# Patient Record
Sex: Female | Born: 1945
Health system: Southern US, Community
[De-identification: ages and names within clinical notes are randomized; demographics above are authoritative.]

## PROBLEM LIST (undated history)

## (undated) DIAGNOSIS — D131 Benign neoplasm of stomach: Secondary | ICD-10-CM

## (undated) DIAGNOSIS — Q402 Other specified congenital malformations of stomach: Secondary | ICD-10-CM

## (undated) DIAGNOSIS — C801 Malignant (primary) neoplasm, unspecified: Secondary | ICD-10-CM

## (undated) DIAGNOSIS — K635 Polyp of colon: Secondary | ICD-10-CM

## (undated) DIAGNOSIS — J309 Allergic rhinitis, unspecified: Secondary | ICD-10-CM

## (undated) DIAGNOSIS — M1990 Unspecified osteoarthritis, unspecified site: Secondary | ICD-10-CM

## (undated) DIAGNOSIS — F419 Anxiety disorder, unspecified: Secondary | ICD-10-CM

## (undated) DIAGNOSIS — E669 Obesity, unspecified: Secondary | ICD-10-CM

## (undated) DIAGNOSIS — K529 Noninfective gastroenteritis and colitis, unspecified: Secondary | ICD-10-CM

## (undated) DIAGNOSIS — G4730 Sleep apnea, unspecified: Secondary | ICD-10-CM

## (undated) DIAGNOSIS — K219 Gastro-esophageal reflux disease without esophagitis: Secondary | ICD-10-CM

## (undated) DIAGNOSIS — T7840XA Allergy, unspecified, initial encounter: Secondary | ICD-10-CM

## (undated) DIAGNOSIS — K5790 Diverticulosis of intestine, part unspecified, without perforation or abscess without bleeding: Secondary | ICD-10-CM

## (undated) DIAGNOSIS — H269 Unspecified cataract: Secondary | ICD-10-CM

## (undated) DIAGNOSIS — D126 Benign neoplasm of colon, unspecified: Secondary | ICD-10-CM

## (undated) DIAGNOSIS — K5010 Crohn's disease of large intestine without complications: Secondary | ICD-10-CM

## (undated) DIAGNOSIS — K824 Cholesterolosis of gallbladder: Secondary | ICD-10-CM

## (undated) DIAGNOSIS — I10 Essential (primary) hypertension: Secondary | ICD-10-CM

## (undated) DIAGNOSIS — K648 Other hemorrhoids: Secondary | ICD-10-CM

## (undated) DIAGNOSIS — E785 Hyperlipidemia, unspecified: Secondary | ICD-10-CM

## (undated) HISTORY — DX: Anxiety disorder, unspecified: F41.9

## (undated) HISTORY — DX: Malignant (primary) neoplasm, unspecified: C80.1

## (undated) HISTORY — DX: Crohn's disease of large intestine without complications: K50.10

## (undated) HISTORY — PX: POLYPECTOMY: SHX149

## (undated) HISTORY — DX: Unspecified cataract: H26.9

## (undated) HISTORY — DX: Essential (primary) hypertension: I10

## (undated) HISTORY — DX: Diverticulosis of intestine, part unspecified, without perforation or abscess without bleeding: K57.90

## (undated) HISTORY — DX: Unspecified osteoarthritis, unspecified site: M19.90

## (undated) HISTORY — DX: Other specified congenital malformations of stomach: Q40.2

## (undated) HISTORY — DX: Benign neoplasm of colon, unspecified: D12.6

## (undated) HISTORY — DX: Polyp of colon: K63.5

## (undated) HISTORY — PX: COLONOSCOPY: SHX174

## (undated) HISTORY — DX: Other hemorrhoids: K64.8

## (undated) HISTORY — DX: Sleep apnea, unspecified: G47.30

## (undated) HISTORY — DX: Allergic rhinitis, unspecified: J30.9

## (undated) HISTORY — DX: Obesity, unspecified: E66.9

## (undated) HISTORY — DX: Gastro-esophageal reflux disease without esophagitis: K21.9

## (undated) HISTORY — DX: Allergy, unspecified, initial encounter: T78.40XA

## (undated) HISTORY — DX: Cholesterolosis of gallbladder: K82.4

## (undated) HISTORY — DX: Benign neoplasm of stomach: D13.1

## (undated) HISTORY — DX: Hyperlipidemia, unspecified: E78.5

## (undated) HISTORY — DX: Noninfective gastroenteritis and colitis, unspecified: K52.9

## (undated) HISTORY — PX: UPPER GASTROINTESTINAL ENDOSCOPY: SHX188

---

## 1992-03-13 HISTORY — PX: SPINE SURGERY: SHX786

## 1997-10-15 ENCOUNTER — Other Ambulatory Visit: Admission: RE | Admit: 1997-10-15 | Discharge: 1997-10-15 | Payer: Self-pay | Admitting: Obstetrics & Gynecology

## 1997-10-16 ENCOUNTER — Other Ambulatory Visit: Admission: RE | Admit: 1997-10-16 | Discharge: 1997-10-16 | Payer: Self-pay | Admitting: Obstetrics & Gynecology

## 1998-12-14 ENCOUNTER — Encounter (INDEPENDENT_AMBULATORY_CARE_PROVIDER_SITE_OTHER): Payer: Self-pay | Admitting: Specialist

## 1998-12-14 ENCOUNTER — Other Ambulatory Visit: Admission: RE | Admit: 1998-12-14 | Discharge: 1998-12-14 | Payer: Self-pay | Admitting: Obstetrics & Gynecology

## 2000-01-05 ENCOUNTER — Other Ambulatory Visit: Admission: RE | Admit: 2000-01-05 | Discharge: 2000-01-05 | Payer: Self-pay | Admitting: Obstetrics & Gynecology

## 2000-02-01 ENCOUNTER — Encounter (INDEPENDENT_AMBULATORY_CARE_PROVIDER_SITE_OTHER): Payer: Self-pay

## 2000-02-01 ENCOUNTER — Other Ambulatory Visit: Admission: RE | Admit: 2000-02-01 | Discharge: 2000-02-01 | Payer: Self-pay | Admitting: Obstetrics & Gynecology

## 2001-05-16 ENCOUNTER — Other Ambulatory Visit: Admission: RE | Admit: 2001-05-16 | Discharge: 2001-05-16 | Payer: Self-pay | Admitting: Obstetrics & Gynecology

## 2003-03-14 HISTORY — PX: FOOT SURGERY: SHX648

## 2004-09-16 ENCOUNTER — Ambulatory Visit: Payer: Self-pay | Admitting: Podiatry

## 2009-03-13 HISTORY — PX: BLADDER SUSPENSION: SHX72

## 2009-05-04 ENCOUNTER — Encounter: Payer: Self-pay | Admitting: Pulmonary Disease

## 2010-02-09 DIAGNOSIS — R05 Cough: Secondary | ICD-10-CM

## 2010-02-10 ENCOUNTER — Ambulatory Visit: Payer: Self-pay | Admitting: Pulmonary Disease

## 2010-02-10 DIAGNOSIS — E785 Hyperlipidemia, unspecified: Secondary | ICD-10-CM

## 2010-02-10 DIAGNOSIS — E1169 Type 2 diabetes mellitus with other specified complication: Secondary | ICD-10-CM | POA: Insufficient documentation

## 2010-02-10 DIAGNOSIS — J449 Chronic obstructive pulmonary disease, unspecified: Secondary | ICD-10-CM | POA: Insufficient documentation

## 2010-02-10 DIAGNOSIS — R0602 Shortness of breath: Secondary | ICD-10-CM

## 2010-02-10 DIAGNOSIS — G4733 Obstructive sleep apnea (adult) (pediatric): Secondary | ICD-10-CM

## 2010-02-10 DIAGNOSIS — E1159 Type 2 diabetes mellitus with other circulatory complications: Secondary | ICD-10-CM | POA: Insufficient documentation

## 2010-02-10 DIAGNOSIS — R0609 Other forms of dyspnea: Secondary | ICD-10-CM | POA: Insufficient documentation

## 2010-02-10 DIAGNOSIS — J45909 Unspecified asthma, uncomplicated: Secondary | ICD-10-CM

## 2010-02-10 DIAGNOSIS — I10 Essential (primary) hypertension: Secondary | ICD-10-CM | POA: Insufficient documentation

## 2010-03-13 HISTORY — PX: NASAL SINUS SURGERY: SHX719

## 2010-04-12 NOTE — Assessment & Plan Note (Signed)
Summary: ASTHMA/ SOB X 1 YR- SELF REFERRAL//KP   Visit Type:  Initial Consult Copy to:  self Primary Khing Belcher/Referring Syna Gad:  Nancy Fetter, NP  CC:  cough.  History of Present Illness: 64/F, ex smoker for evaluation of dyspnea c/o DOE x 1 yr, worse x 4 m cough x 2 yrs,paroxysms, seen by dr Redmond Baseman - 'swollen larynx' - nexium two times a day helped but has not stopped PSG  feb '11 >>severe OSA with AHI 36/h , on CPAP x april  '11 O2 added to CPAP based on noct oximetry ASthma diagnosed 1 yr ago, no specific triggers, no nocturnal symptoms.Advair helps with breathing, Has tried singualir, qvar in the past. Spiroemtry s/o restriction -per report, CXR '06 nml, aug'11 pulm vascular congestion, no infiltrates Cardiolite stress test was nml per report No drop in satn on ambulation, PFTs - no airway obstruction, no restriction, nml DLCO CXR - mild cardiomegaly, bibasal atx  Preventive Screening-Counseling & Management  Alcohol-Tobacco     Alcohol drinks/day: 0     Smoking Status: quit     Packs/Day: 2.0     Year Started: 1965     Year Quit: 1985  Current Medications (verified): 1)  Nexium 40 Mg Cpdr (Esomeprazole Magnesium) .... Take 1 Capsule By Mouth Two Times A Day 2)  Xanax 0.25 Mg Tabs (Alprazolam) .... As Needed 3)  Advair Hfa 115-21 Mcg/act Aero (Fluticasone-Salmeterol) .... Inhale 2 Puffs Two Times A Day 4)  Fluticasone Propionate 50 Mcg/act Susp (Fluticasone Propionate) .... Daily 5)  Simvastatin 20 Mg Tabs (Simvastatin) .... Take 1 Tab By Mouth At Bedtime 6)  Hydrochlorothiazide 25 Mg Tabs (Hydrochlorothiazide) .... Take 1 Tablet By Mouth Once A Day 7)  Lovaza 1 Gm Caps (Omega-3-Acid Ethyl Esters) .... Take 2 Tablet By Mouth Two Times A Day 8)  Vitamin D (Ergocalciferol) 50000 Unit Caps (Ergocalciferol) .... Once Monthly 9)  Delsym Night Time Cough/cold 15-6.25-500 Mg/57m Liqd (Dm-Doxylamine-Acetaminophen) .... Daily 10)  Cpap 11)  Oxygen .... 2 Liters At  Bedtime  Allergies (verified): 1)  ! Sulfa 2)  ! Amoxicillin 3)  ! 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  The patient denies non-productive cough, coughing up blood, chest pain, irregular heartbeats, acid heartburn, indigestion, loss of appetite, weight change, abdominal pain, difficulty swallowing, sore throat, tooth/dental problems, headaches, nasal congestion/difficulty breathing through nose, sneezing, itching, ear ache, anxiety, depression, hand/feet swelling, joint stiffness or pain, rash, change in color of mucus, and fever.    Vital Signs:  Patient profile:   65year old female Height:      65 inches Weight:      237 pounds BMI:     39.58 O2 Sat:      94 % on Room air Temp:     98.4 degrees F oral Pulse rate:   94 / minute BP sitting:   120 / 78  (left arm) Cuff size:   large  Vitals Entered By: JIran PlanasCMA (February 10, 2010 2:28 PM)  O2 Flow:  Room air  Serial Vital Signs/Assessments:  Comments: Ambulatory Pulse Oximetry  Resting; HR__88___    02 Sat__95%RA___  Lap1 (185 feet)   HR__135___   02 Sat__92%RA___ Lap2 (185 feet)   HR__118___   02 Sat__92%RA___    Lap3 (185 feet)   HR__118___   02 Sat__92%RA___  _x__Test Completed without Difficulty Iran Planas CMA  February 10, 2010 3:30 PM  ___Test Stopped due to:   By: Iran Planas CMA   CC: cough Comments Medications reviewed with patient Verified contact number and pharmacy with patient Iran Planas CMA  February 10, 2010  2:28 PM    Physical Exam  Additional Exam:  Gen. Pleasant, well-nourished, in no distress, normal affect ENT - no lesions, no post nasal drip, class 3 airway Neck: No JVD, no thyromegaly, no carotid bruits Lungs: no use of accessory muscles, no dullness to percussion, clear without rales or rhonchi  Cardiovascular: Rhythm regular, heart sounds  normal, no murmurs or gallops, no peripheral edema Abdomen: soft and non-tender, no hepatosplenomegaly, BS normal. Musculoskeletal: No deformities, no cyanosis or clubbing Neuro:  alert, non focal     CXR  Procedure date:  02/10/2010  Findings:      IMPRESSION: Cardiomegaly without edema.  Bibasilar atelectasis.  Impression & Recommendations:  Problem # 1:  DYSPNEA (ICD-786.05) Unclear cause - PFTs nml (took advair this am) CXR nml except for mild cardiomegaly Does not desaturate on exertion, doubt she has occult pulmonary fibrosis No risk factors for thromboembolic disease Cardiac evaluation defered to PMD - she had cardiolite stress test nml by report. Exercise program for deconditioning Orders: Pulmonary Referral (Pulmonary) T-2 View CXR (71020TC) New Patient Level III (91638)  Problem # 2:  ASTHMA (ICD-493.90) stay on advair for now  Problem # 3:  SLEEP APNEA (ICD-780.57)  Compliance encouraged, wt loss emphasized, asked to avoid meds with sedative side effects, cautioned against driving when sleepy.   Orders: New Patient Level III (46659)  Medications Added to Medication List This Visit: 1)  Advair Hfa 115-21 Mcg/act Aero (Fluticasone-salmeterol) .... Inhale 2 puffs two times a day 2)  Fluticasone Propionate 50 Mcg/act Susp (Fluticasone propionate) .... Daily 3)  Simvastatin 20 Mg Tabs (Simvastatin) .... Take 1 tab by mouth at bedtime 4)  Hydrochlorothiazide 25 Mg Tabs (Hydrochlorothiazide) .... Take 1 tablet by mouth once a day 5)  Lovaza 1 Gm Caps (Omega-3-acid ethyl esters) .... Take 2 tablet by mouth two times a  day 6)  Vitamin D (ergocalciferol) 50000 Unit Caps (Ergocalciferol) .... Once monthly 7)  Delsym Night Time Cough/cold 15-6.25-500 Mg/36m Liqd (Dm-doxylamine-acetaminophen) .... Daily 8)  Cpap  9)  Oxygen  .... 2 liters at bedtime  Patient Instructions: 1)  Copy sent tDJ:TTSVXBDoug SouFNP, [place] 2)  Please schedule a follow-up appointment in 2-3 months. 3)  Breathing test was normal 4)  Oxygen level did not drop when walking 5)  chest xray was normal 6)  Stay on advair  7)  A 2D Echocardiogram has been recommended.  Your imaging study may require preauthorization. 8)  Start on an exercise program    Immunization History:  Influenza Immunization History:    Influenza:  historical (01/17/2010)

## 2010-04-12 NOTE — Miscellaneous (Signed)
Summary: Orders Update pft charges  Clinical Lists Changes  Orders: Added new Service order of Carbon Monoxide diffusing w/capacity (94720) - Signed Added new Service order of Lung Volumes (94240) - Signed Added new Service order of Spirometry (Pre & Post) (94060) - Signed 

## 2010-10-29 LAB — HM DEXA SCAN: HM Dexa Scan: NORMAL

## 2010-12-17 ENCOUNTER — Emergency Department (HOSPITAL_COMMUNITY)
Admission: EM | Admit: 2010-12-17 | Discharge: 2010-12-17 | Disposition: A | Discharge status: Home / Self Care | Payer: BC Managed Care – PPO | Attending: Emergency Medicine | Admitting: Emergency Medicine

## 2010-12-17 DIAGNOSIS — I10 Essential (primary) hypertension: Secondary | ICD-10-CM | POA: Insufficient documentation

## 2010-12-17 DIAGNOSIS — M542 Cervicalgia: Secondary | ICD-10-CM | POA: Insufficient documentation

## 2010-12-17 DIAGNOSIS — E7800 Pure hypercholesterolemia, unspecified: Secondary | ICD-10-CM | POA: Insufficient documentation

## 2010-12-17 DIAGNOSIS — Z9889 Other specified postprocedural states: Secondary | ICD-10-CM | POA: Insufficient documentation

## 2012-03-13 HISTORY — PX: UPPER GASTROINTESTINAL ENDOSCOPY: SHX188

## 2012-10-28 LAB — HM MAMMOGRAPHY

## 2012-11-29 ENCOUNTER — Encounter: Payer: Self-pay | Admitting: Internal Medicine

## 2012-12-30 ENCOUNTER — Encounter: Payer: Self-pay | Admitting: Internal Medicine

## 2012-12-31 ENCOUNTER — Encounter: Payer: Self-pay | Admitting: Internal Medicine

## 2012-12-31 ENCOUNTER — Ambulatory Visit (INDEPENDENT_AMBULATORY_CARE_PROVIDER_SITE_OTHER): Payer: BC Managed Care – PPO | Admitting: Internal Medicine

## 2012-12-31 VITALS — BP 138/76 | HR 86 | Ht 65.0 in | Wt 234.0 lb

## 2012-12-31 DIAGNOSIS — R05 Cough: Secondary | ICD-10-CM

## 2012-12-31 DIAGNOSIS — R140 Abdominal distension (gaseous): Secondary | ICD-10-CM

## 2012-12-31 DIAGNOSIS — R141 Gas pain: Secondary | ICD-10-CM

## 2012-12-31 DIAGNOSIS — K219 Gastro-esophageal reflux disease without esophagitis: Secondary | ICD-10-CM

## 2012-12-31 DIAGNOSIS — Z1211 Encounter for screening for malignant neoplasm of colon: Secondary | ICD-10-CM

## 2012-12-31 MED ORDER — RESTORA PO CAPS: 1.0000 | ORAL_CAPSULE | Freq: Every day | ORAL | Status: DC

## 2012-12-31 MED ORDER — SIMETHICONE 125 MG PO CHEW: 125.0000 mg | CHEWABLE_TABLET | Freq: Four times a day (QID) | ORAL | Status: DC | PRN

## 2012-12-31 NOTE — Patient Instructions (Signed)
You have been scheduled for an endoscopy with propofol. Please follow written instructions given to you at your visit today. If you use inhalers (even only as needed), please bring them with you on the day of your procedure. Your physician has requested that you go to www.[email or web address] and enter the access code given to you at your visit today. This web site gives a general overview about your procedure. However, you should still follow specific instructions given to you by our office regarding your preparation for the procedure.  We have sent the following medications to your pharmacy for you to pick up at your convenience: Restora, Phazym   You have been given information in a anti-gas diet                                               We are excited to introduce MyChart, a new best-in-class service that provides you online access to important information in your electronic medical record. We want to make it easier for you to view your health information - all in one secure location - when and where you need it. We expect MyChart will enhance the quality of care and service we provide.  When you register for MyChart, you can:    View your test results.    Request appointments and receive appointment reminders via email.    Request medication renewals.    View your medical history, allergies, medications and immunizations.    Communicate with your physician's office through a password-protected site.    Conveniently print information such as your medication lists.  To find out if MyChart is right for you, please talk to a member of our clinical staff today. We will gladly answer your questions about this free health and wellness tool.  If you are age 63 or older and want a member of your family to have access to your record, you must provide written consent by completing a proxy form available at our office. Please speak to our clinical staff about guidelines regarding accounts for  patients younger than age 3.  As you activate your MyChart account and need any technical assistance, please call the MyChart technical support line at [phone]) or email your question to [email or web address]. If you email your question(s), please include your name, a return phone number and the best time to reach you.  If you have non-urgent health-related questions, you can send a message to our office through [place] at [email or web address]. If you have a medical emergency, call 911.  Thank you for using MyChart as your new health and wellness resource!   MyChart licensed from [employer],  1999-2010. Patents Pending.

## 2012-12-31 NOTE — Progress Notes (Addendum)
Patient ID: Kayla Maddox, female   DOB: 24-Aug-1945, 67 y.o.   MRN: [id number] HPI: Mrs. Kayla Maddox is a 67 yo female with PMH of hypertension, hyperlipidemia, sleep apnea, allergic rhinitis who seen in consultation at the request of Dr. Harold Hedge to evaluate for possible reflux disease and chronic cough. The patient reports at least 3 years of chronic episodic coughing. She has been evaluated previously by ENT, allergy and asthma, and pulmonary. She is currently following with Dr. Harold Hedge and has been started on Qvar and nasal fluticasone. She reports significant improvement in her cough with the initiation of this therapy. She has not having to use albuterol rescue inhaler. She also reports improvement in her dyspnea and denies dyspnea altogether today. She reports her cough is probably 50% better. She still has episodes of coughing throughout the day at times this can be productive of yellowish phlegm. She does feel it is slightly worse just as she is lying down, but is not having coughing episodes throughout the night. It is not interfering with sleep. She denies abdominal pain. No nausea or vomiting. She has been started on omeprazole 20 mg twice daily. Initially she was having difficulty remembering the second dose, but over the past month she has been using it twice daily before breakfast and dinner. She reports she's never really had heartburn. She does have a history of globus sensation but denies dysphagia or odynophagia. She denies waterbrash. No regurgitation of food or fluid. She does report abdominal bloating and gas along with belching after her evening meal. This does not seem to happen in the morning or at lunch. She reports normal bowel habits without blood in her stool or melena. Occasionally she'll have an urgent stool after eating a greasy meal. She reports a history of colonoscopy 9 years ago and was told to have it repeated in 10 years. She did call her GI practice was performed her first  colonoscopy recently and she is sure that the test was 9 years ago.  Past Medical History  Diagnosis Date  . Anxiety   . Allergic rhinitis   . Hypertension   . Hyperlipidemia   . Obesity   . Sleep apnea     Past Surgical History  Procedure Laterality Date  . Nasal sinus surgery    . Thoracotomy    . Spine surgery    . Bladder suspension    . Foot surgery      Current Outpatient Prescriptions  Medication Sig Dispense Refill  . albuterol (PROVENTIL HFA;VENTOLIN HFA) 108 (90 BASE) MCG/ACT inhaler Inhale 2 puffs into the lungs every 6 (six) hours as needed for wheezing.      Marland Kitchen ALPRAZolam (XANAX) 0.25 MG tablet Take 0.25 mg by mouth at bedtime as needed for sleep.      . beclomethasone (QVAR) 80 MCG/ACT inhaler Inhale 1 puff into the lungs as needed.      . cetirizine (ZYRTEC) 10 MG chewable tablet Chew 10 mg by mouth daily.      . Cholecalciferol (VITAMIN D-3) 1000 UNITS CAPS Take 1 capsule by mouth daily.      Marland Kitchen co-enzyme Q-10 30 MG capsule Take 30 mg by mouth 3 (three) times daily.      . fluticasone (FLONASE) 50 MCG/ACT nasal spray Place 2 sprays into the nose daily.      . hydrochlorothiazide (HYDRODIURIL) 12.5 MG tablet Take 25 mg by mouth daily.       Marland Kitchen omeprazole (PRILOSEC) 20 MG capsule Take  20 mg by mouth 2 (two) times daily.       . simvastatin (ZOCOR) 20 MG tablet Take 20 mg by mouth every evening.       No current facility-administered medications for this visit.    Allergies  Allergen Reactions  . Amoxicillin   . Clarithromycin   . Doxycycline   . Prednisone   . Sulfonamide Derivatives     Family History  Problem Relation Age of Onset  . Diabetes Father   . Hypertension Father   . Hypertension Mother   . Heart disease Mother   . Heart disease Father     History  Substance Use Topics  . Smoking status: Former Research scientist (life sciences)  . Smokeless tobacco: Never Used  . Alcohol Use: No    ROS: As per history of present illness, otherwise negative  BP 138/76   Pulse 86  Ht 5' 5"  (1.651 m)  Wt 234 lb (106.142 kg)  BMI 38.94 kg/m2 Constitutional: Well-developed and well-nourished. No distress. HEENT: Normocephalic and atraumatic. Oropharynx is clear and moist. No oropharyngeal exudate. Conjunctivae are normal.  No scleral icterus. Neck: Neck supple. Trachea midline. Cardiovascular: Normal rate, regular rhythm and intact distal pulses. No M/R/G Pulmonary/chest: Effort normal and breath sounds normal. No wheezing, rales or rhonchi. Abdominal: Soft, nontender, nondistended. Bowel sounds active throughout.  Extremities: no clubbing, cyanosis, or edema Lymphadenopathy: No cervical adenopathy noted. Neurological: Alert and oriented to person place and time. Skin: Skin is warm and dry. No rashes noted. Psychiatric: Normal mood and affect. Behavior is normal.  ASSESSMENT/PLAN:  67 yo female with PMH of hypertension, hyperlipidemia, sleep apnea, allergic rhinitis who seen in consultation at the request of Dr. Harold Hedge to evaluate for possible reflux disease and chronic cough.   1.  Chronic cough/?GERD -- chronic cough has improved with nasal and inhaled steroid. Her cough may in fact be multifactorial, and it is difficult to know whether or not she truly has GERD.  She is on twice a day PPI which we have discussed should give her near maximal acid suppression. We discussed how the workup includes upper endoscopy to rule out changes of reflux such as Barrett's esophagus erosive esophagitis. It will also help exclude a large hiatal hernia which could contribute to reflux. After discussion the risks and benefits of this test, she is agreeable to proceed. For now she will continue omeprazole 20 mg twice daily. We have also discussed 24-hour pH testing should her upper endoscopy be unremarkable. If her 24-hour pH testing revealed correlation between reflux and coughing, options would be change in acid suppressing medication or possibly an antireflux surgery. GERD  diet recommended.  2.  Gas/bloating -- I have given her an anti-bloating diet and recommended a daily probiotic. She was given samples of Restora, once daily.  She can also use Phazyme after her evening meal as needed. We will reassess her bloating after these measures.  3.  CRC screening -- she would like to have her subsequent screening colonoscopies here. We discussed possibly proceeding at the time of her upper endoscopy, but she would prefer to wait the full 10 years. We have requested her previous colonoscopy records from her exam 9 years ago.   Addendum, colonoscopy records received from [facility] in [place], [place] Colonoscopy was performed on 05/21/2003 -- exam to the terminal ileum- findings normal rectal exam, in the sigmoid exudative lesions with apthous-like ulceration, again exudative and ulcerative in the cecum with occasional ulcers throughout the colon. 6  mm descending polyp and a 4 mm sigmoid polyp. Pathology hyperplastic polyp, random biopsies chronic inflammation with focal active cryptitis --Notes indicate patient was treated with antibiotics  Based on the above finding, she would be due for screening colonoscopy in March of 2015. Recall will be placed

## 2013-01-01 ENCOUNTER — Encounter: Payer: Self-pay | Admitting: Internal Medicine

## 2013-01-01 ENCOUNTER — Ambulatory Visit (AMBULATORY_SURGERY_CENTER): Payer: BC Managed Care – PPO | Admitting: Internal Medicine

## 2013-01-01 VITALS — BP 135/92 | HR 72 | Temp 96.8°F | Resp 33 | Ht 65.0 in | Wt 234.0 lb

## 2013-01-01 DIAGNOSIS — R05 Cough: Secondary | ICD-10-CM

## 2013-01-01 DIAGNOSIS — K2990 Gastroduodenitis, unspecified, without bleeding: Secondary | ICD-10-CM

## 2013-01-01 DIAGNOSIS — K2970 Gastritis, unspecified, without bleeding: Secondary | ICD-10-CM

## 2013-01-01 DIAGNOSIS — H5040 Unspecified heterotropia: Secondary | ICD-10-CM

## 2013-01-01 MED ORDER — SODIUM CHLORIDE 0.9 % IV SOLN: 500.0000 mL | INTRAVENOUS | Status: DC

## 2013-01-01 NOTE — Progress Notes (Signed)
Called to room to assist during endoscopic procedure.  Patient ID and intended procedure confirmed with present staff. Received instructions for my participation in the procedure from the performing physician.  

## 2013-01-01 NOTE — Op Note (Signed)
[place]  [address] [place], [address]   ENDOSCOPY PROCEDURE REPORT  PATIENT: [name], [name]  MR#: [id number] BIRTHDATE: 1945-06-22 , 57  yrs. old GENDER: Female ENDOSCOPIST: Jerene Bears, MD REFERRED BY:  Melvern Sample, MD PROCEDURE DATE:  01/01/2013 PROCEDURE:  EGD w/ biopsy ASA CLASS:     Class III INDICATIONS:  Chronic cough MEDICATIONS: MAC sedation, administered by CRNA and propofol (Diprivan) 257m IV TOPICAL ANESTHETIC: Cetacaine Spray  DESCRIPTION OF PROCEDURE: After the risks benefits and alternatives of the procedure were thoroughly explained, informed consent was obtained.  The LB [id number] was introduced through the mouth and advanced to the second portion of the duodenum. Without limitations.  The instrument was slowly withdrawn as the mucosa was fully examined.     ESOPHAGUS: A variable Z-line was observed 40 cm from the incisors. The mucosa of the esophagus appeared normal.   No evidence of esophagitis or Barrett's esophagus.  STOMACH: Multiple sessile polyps ranging between 3-593min size were found in the gastric fundus and gastric body.  Multiple biopsies was performed using cold forceps.   Mild nodular gastritis (inflammation) was found in the gastric antrum.  Multiple biopsies were performed using cold forceps.  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  Multiple biopsies from duodenal bulb.   The duodenal mucosa showed no abnormalities in the 2nd part of the duodenum.  Retroflexed views revealed no abnormalities.     The scope was then withdrawn from the patient and the procedure completed.  COMPLICATIONS: There were no complications.  ENDOSCOPIC IMPRESSION: 1.   Variable Z-line was observed 40 cm from the incisors 2.   The mucosa of the esophagus appeared normal 3.   Multiple sessile polyps ranging between 3-60m47mn size  were found in the gastric fundus and gastric body 4.   Nodular gastritis (inflammation) was found in the gastric antrum; multiple biopsies 5.   Duodenal inflammation was found in the duodenal bulb 6.   The duodenal mucosa showed no abnormalities in the 2nd part of the duodenum  RECOMMENDATIONS: 1.  Await pathology results 2.  Continue current medications  eSigned:  JayJerene BearsD 01/01/2013 9:44 AM   CC: The patient, LynKennith MaesD, ChrMelvern SampleD  PATIENT NAME:  Kayla, Maddox#: [id number]

## 2013-01-01 NOTE — Progress Notes (Signed)
Patient did not experience any of the following events: a burn prior to discharge; a fall within the facility; wrong site/side/patient/procedure/implant event; or a hospital transfer or hospital admission upon discharge from the facility. (G8907)Patient did not have preoperative order for IV antibiotic SSI prophylaxis. (G8918) ewm 

## 2013-01-01 NOTE — Progress Notes (Signed)
Lidocaine-40mg IV prior to Propofol InductionPropofol given over incremental dosages 

## 2013-01-01 NOTE — Patient Instructions (Signed)
YOU HAD AN ENDOSCOPIC PROCEDURE TODAY AT [facility]: Refer to the procedure report that was given to you for any specific questions about what was found during the examination.  If the procedure report does not answer your questions, please call your gastroenterologist to clarify.  If you requested that your care partner not be given the details of your procedure findings, then the procedure report has been included in a sealed envelope for you to review at your convenience later.  YOU SHOULD EXPECT: Some feelings of bloating in the abdomen. Passage of more gas than usual.  Walking can help get rid of the air that was put into your GI tract during the procedure and reduce the bloating. If you had a lower endoscopy (such as a colonoscopy or flexible sigmoidoscopy) you may notice spotting of blood in your stool or on the toilet paper. If you underwent a bowel prep for your procedure, then you may not have a normal bowel movement for a few days.  DIET: Your first meal following the procedure should be a light meal and then it is ok to progress to your normal diet.  A half-sandwich or bowl of soup is an example of a good first meal.  Heavy or fried foods are harder to digest and may make you feel nauseous or bloated.  Likewise meals heavy in dairy and vegetables can cause extra gas to form and this can also increase the bloating.  Drink plenty of fluids but you should avoid alcoholic beverages for 24 hours.  ACTIVITY: Your care partner should take you home directly after the procedure.  You should plan to take it easy, moving slowly for the rest of the day.  You can resume normal activity the day after the procedure however you should NOT DRIVE or use heavy machinery for 24 hours (because of the sedation medicines used during the test).    SYMPTOMS TO REPORT IMMEDIATELY: A gastroenterologist can be reached at any hour.  During normal business hours, 8:30 AM to 5:00 PM Monday through Friday,  call [phone].  After hours and on weekends, please call the GI answering service at [phone]  Emergency number who will take a message and have the physician on call contact you.   Following upper endoscopy (EGD)  Vomiting of blood or coffee ground material  New chest pain or pain under the shoulder blades  Painful or persistently difficult swallowing  New shortness of breath  Fever of 100F or higher  Black, tarry-looking stools  FOLLOW UP: If any biopsies were taken you will be contacted by phone or by letter within the next 1-3 weeks.  Call your gastroenterologist if you have not heard about the biopsies in 3 weeks.  Our staff will call the home number listed on your records the next business day following your procedure to check on you and address any questions or concerns that you may have at that time regarding the information given to you following your procedure. This is a courtesy call and so if there is no answer at the home number and we have not heard from you through the emergency physician on call, we will assume that you have returned to your regular daily activities without incident.  SIGNATURES/CONFIDENTIALITY: You and/or your care partner have signed paperwork which will be entered into your electronic medical record.  These signatures attest to the fact that that the information above on your After Visit Summary has been reviewed and is understood.  Full responsibility of the confidentiality of this discharge information lies with you and/or your care-partner.

## 2013-01-02 ENCOUNTER — Telehealth: Payer: Self-pay

## 2013-01-02 NOTE — Telephone Encounter (Signed)
  Follow up Call-  Call back number 01/01/2013  Post procedure Call Back phone  # [phone]  Permission to leave phone message Yes     Patient questions:  Do you have a fever, pain , or abdominal swelling? no Pain Score  0 *  Have you tolerated food without any problems? yes  Have you been able to return to your normal activities? yes  Do you have any questions about your discharge instructions: Diet   no Medications  no Follow up visit  no  Do you have questions or concerns about your Care? no  Actions: * If pain score is 4 or above: No action needed, pain <4.  No problems per the pt. Maw

## 2013-01-08 ENCOUNTER — Encounter: Payer: Self-pay | Admitting: Internal Medicine

## 2013-01-15 ENCOUNTER — Telehealth: Payer: Self-pay | Admitting: Internal Medicine

## 2013-01-15 DIAGNOSIS — R05 Cough: Secondary | ICD-10-CM

## 2013-01-16 NOTE — Telephone Encounter (Signed)
Pt has no preference in pulmonology  md and is being referred for her cough. lmom for pt with her appt with Dr Melvyn Novas for 01/24/13.

## 2013-01-16 NOTE — Telephone Encounter (Signed)
lmom for pt to call back; asked her to let me know if she wanted [facility] and if the appt is for "coughing".

## 2013-01-24 ENCOUNTER — Encounter: Payer: Self-pay | Admitting: Internal Medicine

## 2013-01-24 ENCOUNTER — Ambulatory Visit (INDEPENDENT_AMBULATORY_CARE_PROVIDER_SITE_OTHER): Payer: BC Managed Care – PPO | Admitting: Internal Medicine

## 2013-01-24 VITALS — HR 93 | Ht 64.0 in | Wt 236.0 lb

## 2013-01-24 DIAGNOSIS — R05 Cough: Secondary | ICD-10-CM

## 2013-01-24 MED ORDER — GABAPENTIN 100 MG PO CAPS: 100.0000 mg | ORAL_CAPSULE | Freq: Three times a day (TID) | ORAL | Status: DC

## 2013-01-24 NOTE — Patient Instructions (Addendum)
Neurontin 100 mg three times a day  Stop co q 10 for now   Try chlortrimeton 4 mg one at bedtime and can take it during the day if needed for the sense of throat drainage or tickle  But may make you sleepy  Stop qvar for a week what difference if any it makes  Continue prilosec Take 30- 60 min before your first and last meals of the day   GERD (REFLUX)  is an extremely common cause of respiratory symptoms, many times with no significant heartburn at all.    It can be treated with medication, but also with lifestyle changes including avoidance of late meals, excessive alcohol, smoking cessation, and avoid fatty foods, chocolate, peppermint, colas, red wine, and acidic juices such as orange juice.  NO MINT OR MENTHOL PRODUCTS SO NO COUGH DROPS  USE SUGARLESS CANDY INSTEAD (jolley ranchers or [employer] or Insurance claims handler) NO OIL BASED VITAMINS - use powdered substitutes.  Please schedule a follow up office visit in 2 weeks, sooner if needed

## 2013-01-24 NOTE — Progress Notes (Signed)
Subjective:    Patient ID: Kayla Maddox, female    DOB: 1945-08-18    MRN: [id number]  HPI   71 yowf quit smoking 1983 with birth child with no respiratory problem until around 2004 persistent daily since then with w/u by [facility] 2011 and Harold Hedge and Pyrtle referred back to pulmonary clinic 01/24/2013 for eval of incessant coughing. Prev eval by alva imp was asthma rec continue advair   01/24/2013 1st [facility] office visit/ Derold Dorsch cc chronic cough x 10 years  Daily routine immediate on awakening white thick mucus scant amt Much better since [place] but only 50% No better p sinus sur Cough is day > night, severe enough to cause choking.    Kouffman Reflux v Neurogenic Cough Differentiator Reflux Comments  Do you awaken from a sound sleep coughing violently?                            With trouble breathing? no   Do you have choking episodes when you cannot  Get enough air, gasping for air ?              occ   Do you usually cough when you lie down into  The bed, or when you just lie down to rest ?                          Yes   Do you usually cough after meals or eating?         no   Do you cough when (or after) you bend over?    no   GERD SCORE     Kouffman Reflux v Neurogenic Cough Differentiator Neurogenic   Do you more-or-less cough all day long? yes   Does change of temperature make you cough? no   Does laughing or chuckling cause you to cough? no   Do fumes (perfume, automobile fumes, burned  Toast, etc.,) cause you to cough ?      yes   Does speaking, singing, or talking on the phone cause you to cough   ?               yes   Neurogenic/Airway score         No obvious day to day or daytime variabilty or assoc sob unless coughing/ choking or cp or chest tightness, subjective wheeze overt sinus or hb symptoms. No unusual exp hx or h/o childhood pna/ asthma or knowledge of premature birth.  Sleeping ok without nocturnal  or early am exacerbation  of respiratory   c/o's or need for noct saba. Also denies any obvious fluctuation of symptoms with weather or environmental changes or other aggravating or alleviating factors except as outlined above   Current Medications, Allergies, Complete Past Medical History, Past Surgical History, Family History, and Social History were reviewed in [employer] record.       Review of Systems  Constitutional: Negative for fever and unexpected weight change.  HENT: Positive for congestion and trouble swallowing. Negative for dental problem, ear pain, nosebleeds, postnasal drip, rhinorrhea, sinus pressure, sneezing and sore throat.   Eyes: Negative for redness and itching.  Respiratory: Positive for choking. Negative for cough, chest tightness, shortness of breath and wheezing.   Cardiovascular: Negative for palpitations and leg swelling.  Gastrointestinal: Negative for nausea and vomiting.  Genitourinary: Negative for dysuria.  Musculoskeletal: Negative for joint  swelling.  Skin: Negative for rash.  Neurological: Negative for headaches.  Hematological: Does not bruise/bleed easily.  Psychiatric/Behavioral: Negative for dysphoric mood. The patient is nervous/anxious.        Objective:   Physical Exam  amb slt hoarse wf nad  Wt Readings from Last 3 Encounters:  01/24/13 236 lb (107.049 kg)  01/01/13 234 lb (106.142 kg)  12/31/12 234 lb (106.142 kg)     HEENT: nl dentition, turbinates, and orophanx. Nl external ear canals without cough reflex   NECK :  without JVD/Nodes/TM/ nl carotid upstrokes bilaterally   LUNGS: no acc muscle use, clear to A and P bilaterally without cough on insp or exp maneuvers   CV:  RRR  no s3 or murmur or increase in P2, no edema   ABD:  soft and nontender with nl excursion in the supine position. No bruits or organomegaly, bowel sounds nl  MS:  warm without deformities, calf tenderness, cyanosis or clubbing  SKIN: warm and dry without lesions     NEURO:  alert, approp, no deficits        Assessment & Plan:

## 2013-01-25 NOTE — Assessment & Plan Note (Signed)
This is almost certainly  Classic Upper airway cough syndrome, so named because it's frequently impossible to sort out how much is  CR/sinusitis with freq throat clearing (which can be related to primary GERD)   vs  causing  secondary (" extra esophageal")  GERD from wide swings in gastric pressure that occur with throat clearing, often  promoting self use of mint and menthol lozenges that reduce the lower esophageal sphincter tone and exacerbate the problem further in a cyclical fashion.   These are the same pts (now being labeled as having "irritable larynx syndrome" by some cough centers) who not infrequently have a history of having failed to tolerate ace inhibitors,  dry powder inhalers or even HFA ICS like qvar or biphosphonates or report having atypical reflux symptoms that don't respond to standard doses of PPI , and are easily confused as having aecopd or asthma flares by even experienced allergists/ pulmonologists.  rec continue max gerd rx, add neurontin trial and off qvar x 2 weeks

## 2013-02-13 ENCOUNTER — Encounter (INDEPENDENT_AMBULATORY_CARE_PROVIDER_SITE_OTHER): Payer: Self-pay

## 2013-02-13 ENCOUNTER — Encounter: Payer: Self-pay | Admitting: Internal Medicine

## 2013-02-13 ENCOUNTER — Ambulatory Visit (INDEPENDENT_AMBULATORY_CARE_PROVIDER_SITE_OTHER): Payer: BC Managed Care – PPO | Admitting: Internal Medicine

## 2013-02-13 VITALS — BP 126/78 | HR 95 | Temp 98.1°F | Ht 64.0 in | Wt 236.0 lb

## 2013-02-13 DIAGNOSIS — R05 Cough: Secondary | ICD-10-CM

## 2013-02-13 MED ORDER — BENZONATATE 200 MG PO CAPS: ORAL_CAPSULE | ORAL | Status: DC

## 2013-02-13 MED ORDER — PREDNISONE (PAK) 10 MG PO TABS: ORAL_TABLET | ORAL | Status: DC

## 2013-02-13 NOTE — Patient Instructions (Addendum)
deslym tsp every 12 hours and tessalon 200 mg 4 x daily to completely eliminate all cough and throat clearing  Change zyrtec and take it in am instead of pm  Prednisone 10 mg take  4 each am x 2 days,   2 each am x 2 days,  1 each am x 2 days and stop should get you completely over the hump to "undo the snow ball effect"  No other changes   Please schedule a follow up office visit in 4 weeks, sooner if needed

## 2013-02-13 NOTE — Progress Notes (Signed)
Subjective:    Patient ID: Kayla Maddox, female    DOB: August 13, 1945    MRN: [id number]     Brief patient profile:  38 yowf quit smoking 1983 with birth of child with no respiratory problem until around 2004 persistent daily cough since then with w/u by [facility] 2011 and Harold Hedge and Pyrtle referred back to pulmonary clinic 01/24/2013 for eval of incessant coughing. Prev eval by alva imp was asthma rec continue advair  History of Present Illness  01/24/2013 1st [facility] office visit/ Wert cc chronic cough x 10 years  Daily routine immediate on awakening white thick mucus scant amt Much better since [place] but only 50% No better p sinus sur Cough is day > night, severe enough to cause choking rec Neurontin 100 mg three times a day Stop co q 10 for now  Try chlortrimeton 4 mg one at bedtime and can take it during the day if needed for the sense of throat drainage or tickle  But may make you sleepy Stop qvar for a week what difference if any it makes Continue prilosec Take 30- 60 min before your first and last meals of the day  GERD diet     02/13/2013 f/u ov/Wert re: cough x 10 years >"  best in last 4 years " Chief Complaint  Patient presents with  . Cough    follow-up. Pt states she feels cough is 50% improved since OV.       Kouffman Reflux v Neurogenic Cough Differentiator Reflux Comments  Do you awaken from a sound sleep coughing violently?                            With trouble breathing? no   Do you have choking episodes when you cannot  Get enough air, gasping for air ?              occ   Do you usually cough when you lie down into  The bed, or when you just lie down to rest ?                          gone   Do you usually cough after meals or eating?         no   Do you cough when (or after) you bend over?    no   GERD SCORE     Kouffman Reflux v Neurogenic Cough Differentiator Neurogenic   Do you more-or-less cough all day long? better   Does change of  temperature make you cough? no   Does laughing or chuckling cause you to cough? no   Do fumes (perfume, automobile fumes, burned  Toast, etc.,) cause you to cough ?      Not sure   Does speaking, singing, or talking on the phone cause you to cough   ?               better   Neurogenic/Airway score          No obvious other patterns in  day to day or daytime variabilty or assoc sob or cp or chest tightness, subjective wheeze overt sinus or hb symptoms. No unusual exp hx or h/o childhood pna/ asthma or knowledge of premature birth.  Sleeping ok without nocturnal  or early am exacerbation  of respiratory  c/o's or need for noct saba. Also denies any obvious fluctuation  of symptoms with weather or environmental changes or other aggravating or alleviating factors except as outlined above   Current Medications, Allergies, Complete Past Medical History, Past Surgical History, Family History, and Social History were reviewed in [employer] record.  ROS  The following are not active complaints unless bolded sore throat, dysphagia, dental problems, itching, sneezing,  nasal congestion or excess/ purulent secretions, ear ache,   fever, chills, sweats, unintended wt loss, pleuritic or exertional cp, hemoptysis,  orthopnea pnd or leg swelling, presyncope, palpitations, heartburn, abdominal pain, anorexia, nausea, vomiting, diarrhea  or change in bowel or urinary habits, change in stools or urine, dysuria,hematuria,  rash, arthralgias, visual complaints, headache, numbness weakness or ataxia or problems with walking or coordination,  change in mood/affect or memory.                   Objective:   Physical Exam  amb slt hoarse wf nad  02/13/2013      236  Wt Readings from Last 3 Encounters:  01/24/13 236 lb (107.049 kg)  01/01/13 234 lb (106.142 kg)  12/31/12 234 lb (106.142 kg)     HEENT: nl dentition, turbinates, and orophanx. Nl external ear canals without cough  reflex   NECK :  without JVD/Nodes/TM/ nl carotid upstrokes bilaterally   LUNGS: no acc muscle use, clear to A and P bilaterally without cough on insp or exp maneuvers   CV:  RRR  no s3 or murmur or increase in P2, no edema   ABD:  soft and nontender with nl excursion in the supine position. No bruits or organomegaly, bowel sounds nl  MS:  warm without deformities, calf tenderness, cyanosis or clubbing  SKIN: warm and dry without lesions             Assessment & Plan:

## 2013-02-15 NOTE — Assessment & Plan Note (Signed)
-   trial off qvar and on neurontin 01/25/2013 > improved to the "best she's been in 4 years" so unlikely has asthma at all but rather  Classic Upper airway cough syndrome, so named because it's frequently impossible to sort out how much is  CR/sinusitis with freq throat clearing (which can be related to primary GERD)   vs  causing  secondary (" extra esophageal")  GERD from wide swings in gastric pressure that occur with throat clearing, often  promoting self use of mint and menthol lozenges that reduce the lower esophageal sphincter tone and exacerbate the problem further in a cyclical fashion.   These are the same pts (now being labeled as having "irritable larynx syndrome" by some cough centers) who not infrequently have a history of having failed to tolerate ace inhibitors,  dry powder inhalers or biphosphonates or report having atypical reflux symptoms that don't respond to standard doses of PPI , and are easily confused as having aecopd or asthma flares by even experienced allergists/ pulmonologists.   rec maintain off all inhalers if possible and use zyrtec in am, 1st gen h1 at hs to suppress all pnds and f/u in 4 weeks  See instructions for specific recommendations which were reviewed directly with the patient who was given a copy with highlighter outlining the key components.

## 2013-03-20 ENCOUNTER — Ambulatory Visit: Payer: BC Managed Care – PPO | Admitting: Internal Medicine

## 2013-03-21 ENCOUNTER — Ambulatory Visit: Payer: BC Managed Care – PPO | Admitting: Internal Medicine

## 2013-03-27 ENCOUNTER — Encounter: Payer: Self-pay | Admitting: Internal Medicine

## 2013-03-27 ENCOUNTER — Ambulatory Visit (INDEPENDENT_AMBULATORY_CARE_PROVIDER_SITE_OTHER): Payer: BC Managed Care – PPO | Admitting: Internal Medicine

## 2013-03-27 VITALS — BP 116/78 | HR 99 | Temp 98.1°F | Ht 65.0 in | Wt 235.4 lb

## 2013-03-27 DIAGNOSIS — R05 Cough: Secondary | ICD-10-CM

## 2013-03-27 DIAGNOSIS — J45909 Unspecified asthma, uncomplicated: Secondary | ICD-10-CM

## 2013-03-27 DIAGNOSIS — R059 Cough, unspecified: Secondary | ICD-10-CM

## 2013-03-27 NOTE — Assessment & Plan Note (Signed)
-   trial off qvar and on neurontin 01/25/2013 > improved 02/12/13  Marked improvement but still using tessilon > try off and if not successful needs to ramp up neurontin to 300 tid

## 2013-03-27 NOTE — Progress Notes (Signed)
Subjective:    Patient ID: Kayla Maddox, female    DOB: 08/23/1945    MRN: [id number]     Brief patient profile:  62 yowf quit smoking 1983 with birth of child with no respiratory problem until around 2004 persistent daily cough since then with w/u by [facility] 2011 and Harold Hedge and Pyrtle referred back to pulmonary clinic 01/24/2013 for eval of incessant coughing. Prev eval by alva imp was asthma rec continue advair  History of Present Illness  01/24/2013 1st [facility] office visit/ Haeven Nickle cc chronic cough x 10 years  Daily routine immediate on awakening white thick mucus scant amt Much better since [place] but only 50% No better p sinus surg Cough is day > night, severe enough to cause choking rec Neurontin 100 mg three times a day Stop co q 10 for now  Try chlortrimeton 4 mg one at bedtime and can take it during the day if needed for the sense of throat drainage or tickle  But may make you sleepy Stop qvar for a week what difference if any it makes Continue prilosec Take 30- 60 min before your first and last meals of the day  GERD diet     02/13/2013 f/u ov/Kimbery Harwood re: cough x 10 years >"  best in last 4 years " Chief Complaint  Patient presents with  . Cough    follow-up. Pt states she feels cough is 50% improved since OV.   rec deslym tsp every 12 hours and tessalon 200 mg 4 x daily to completely eliminate all cough and throat clearing Change zyrtec and take it in am instead of pm Prednisone 10 mg take  4 each am x 2 days,   2 each am x 2 days,  1 each am x 2 days and stop should get you completely over the hump to "undo the snow ball effect"   03/27/2013 f/u ov/Sherrell Farish re: cough x 10 day Chief Complaint  Patient presents with  . Follow-up    cough is 75% since last ov,denies wheezing or sob,occass. left upper sided chest pain  tession am and pm and during the day "when I can remember it" but not prompted by the cough which is entirely daytime, min productive, only happens  2-3 x daily s pattern  No sob or need for saba    Kouffman Reflux v Neurogenic Cough Differentiator Reflux Comments  Do you awaken from a sound sleep coughing violently?                            With trouble breathing? no   Do you have choking episodes when you cannot  Get enough air, gasping for air ?              RESOLVED   Do you usually cough when you lie down into  The bed, or when you just lie down to rest ?                          gone   Do you usually cough after meals or eating?         no   Do you cough when (or after) you bend over?    no   GERD SCORE     Kouffman Reflux v Neurogenic Cough Differentiator Neurogenic   Do you more-or-less cough all day long? Down to 2-3 per day   Does change  of temperature make you cough? no   Does laughing or chuckling cause you to cough? no   Do fumes (perfume, automobile fumes, burned  Toast, etc.,) cause you to cough ?      Not sure   Does speaking, singing, or talking on the phone cause you to cough   ?               RESOLVED   Neurogenic/Airway score       No obvious other patterns in  day to day or daytime variabilty or assoc sob or cp or chest tightness, subjective wheeze overt sinus or hb symptoms. No unusual exp hx or h/o childhood pna/ asthma or knowledge of premature birth.  Sleeping ok without nocturnal  or early am exacerbation  of respiratory  c/o's or need for noct saba. Also denies any obvious fluctuation of symptoms with weather or environmental changes or other aggravating or alleviating factors except as outlined above   Current Medications, Allergies, Complete Past Medical History, Past Surgical History, Family History, and Social History were reviewed in [employer] record.  ROS  The following are not active complaints unless bolded sore throat, dysphagia, dental problems, itching, sneezing,  nasal congestion or excess/ purulent secretions, ear ache,   fever, chills, sweats, unintended wt loss,  pleuritic or exertional cp, hemoptysis,  orthopnea pnd or leg swelling, presyncope, palpitations, heartburn, abdominal pain, anorexia, nausea, vomiting, diarrhea  or change in bowel or urinary habits, change in stools or urine, dysuria,hematuria,  rash, arthralgias, visual complaints, headache, numbness weakness or ataxia or problems with walking or coordination,  change in mood/affect or memory.                   Objective:   Physical Exam  amb wf nad no longer hoarse  02/13/2013      236  > 03/27/2013  235 Wt Readings from Last 3 Encounters:  01/24/13 236 lb (107.049 kg)  01/01/13 234 lb (106.142 kg)  12/31/12 234 lb (106.142 kg)     HEENT: nl dentition, turbinates, and orophanx. Nl external ear canals without cough reflex   NECK :  without JVD/Nodes/TM/ nl carotid upstrokes bilaterally   LUNGS: no acc muscle use, clear to A and P bilaterally without cough on insp or exp maneuvers   CV:  RRR  no s3 or murmur or increase in P2, no edema   ABD:  soft and nontender with nl excursion in the supine position. No bruits or organomegaly, bowel sounds nl  MS:  warm without deformities, calf tenderness, cyanosis or clubbing  SKIN: warm and dry without lesions             Assessment & Plan:

## 2013-03-27 NOTE — Patient Instructions (Addendum)
Try stopping the tessilon and if the cough gets worse we will ramp up your neurontin to a maximum of 300 mg three times a day  Continue zyrtec at breakfast and the chlortrimeton 4 mg at bedtime  Please schedule a follow up visit in 2 months but call sooner if needed  Add chart review shows no recent cxr, consider on return if flares off tessilon

## 2013-03-27 NOTE — Assessment & Plan Note (Signed)
No flare off qvar since 01/2013 > no evidence of active dz

## 2013-04-09 ENCOUNTER — Telehealth: Payer: Self-pay | Admitting: Internal Medicine

## 2013-04-09 NOTE — Telephone Encounter (Signed)
LMOMTCB x 1 

## 2013-04-09 NOTE — Telephone Encounter (Signed)
neurontin 300 tid then ov with tammy with all meds in hand in 2 weeks

## 2013-04-09 NOTE — Telephone Encounter (Signed)
Per last OV note: Try stopping the tessilon and if the cough gets worse we will ramp up your neurontin to a maximum of 300 mg three times a day  Continue zyrtec at breakfast and the chlortrimeton 4 mg at bedtime  Please schedule a follow up visit in 2 months but call sooner if needed  Add chart review shows no recent cxr, consider on return if flares off tessilon    I spoke with the pt and she states she stopped the tessalon and her cough is much worse, she states she feels a big difference. Pt is currently taking Neurontin 113m three times a day. Dr. WMelvyn Novasplease advise how you would like to increase her neurontin? Thanks. JRandolph Bing CMA

## 2013-04-10 MED ORDER — GABAPENTIN 300 MG PO CAPS: 300.0000 mg | ORAL_CAPSULE | Freq: Three times a day (TID) | ORAL | Status: DC

## 2013-04-10 NOTE — Telephone Encounter (Signed)
Pt scheduled to see TP 04/24/13 at 430 for Med Mgt Aware to bring all meds with her to her OV Rx sent to pharmacy for Neurontin 336m tid

## 2013-04-10 NOTE — Telephone Encounter (Signed)
lmomtcb x1 for pt 

## 2013-04-10 NOTE — Telephone Encounter (Signed)
Pt returned triage's call & can be reached at  [phone].  Kayla Maddox

## 2013-04-24 ENCOUNTER — Ambulatory Visit (INDEPENDENT_AMBULATORY_CARE_PROVIDER_SITE_OTHER): Payer: BC Managed Care – PPO | Admitting: Adult Health

## 2013-04-24 ENCOUNTER — Encounter: Payer: Self-pay | Admitting: Adult Health

## 2013-04-24 ENCOUNTER — Ambulatory Visit (INDEPENDENT_AMBULATORY_CARE_PROVIDER_SITE_OTHER)
Admission: RE | Admit: 2013-04-24 | Discharge: 2013-04-24 | Disposition: A | Discharge status: Home / Self Care | Payer: BC Managed Care – PPO | Source: Ambulatory Visit | Attending: Adult Health | Admitting: Adult Health

## 2013-04-24 VITALS — BP 124/78 | HR 109 | Temp 98.0°F | Ht 65.0 in | Wt 236.8 lb

## 2013-04-24 DIAGNOSIS — R05 Cough: Secondary | ICD-10-CM

## 2013-04-24 DIAGNOSIS — R059 Cough, unspecified: Secondary | ICD-10-CM

## 2013-04-24 NOTE — Assessment & Plan Note (Signed)
Increase Chlortabs 41m 2 At bedtime  , may use every 4hr during daytime if needed. May cause sleepiness.  Follow med calendar closely and bring to each visit.  Please contact office for sooner follow up if symptoms do not improve or worsen or seek emergency care  Follow up Dr. WMelvyn Novas In 3 weeks and As needed

## 2013-04-24 NOTE — Patient Instructions (Addendum)
Increase Chlortabs 10m 2 At bedtime  , may use every 4hr during daytime if needed. May cause sleepiness.  Follow med calendar closely and bring to each visit.  Please contact office for sooner follow up if symptoms do not improve or worsen or seek emergency care  Follow up Dr. WMelvyn Novas In 3 weeks and As needed

## 2013-04-24 NOTE — Progress Notes (Signed)
Subjective:    Patient ID: Kayla Maddox, female    DOB: [birth year]    MRN: [id number] Brief patient profile:  67 yowf quit smoking 1983 with birth of child with no respiratory problem until around 2004 persistent daily cough since then with w/u by [facility] 2011 and [facility]edge and Pyrtle referred back to pulmonary clinic 01/24/2013 for eval of incessant coughing. Prev eval by alva imp was asthma rec continue advair  History of Present Illness  01/24/2013 1st [facility] office visit/ Wert cc chronic cough x 10 years  Daily routine immediate on awakening white thick mucus scant amt Much better since [place] but only 50% No better p sinus surg Cough is day > night, severe enough to cause choking rec Neurontin 100 mg three times a day Stop co q 10 for now  Try chlortrimeton 4 mg one at bedtime and can take it during the day if needed for the sense of throat drainage or tickle  But may make you sleepy Stop qvar for a week what difference if any it makes Continue prilosec Take 30- 60 min before your first and last meals of the day  GERD diet     02/13/2013 f/u ov/Wert re: cough x 10 years >"  best in last 4 years " Chief Complaint  Patient presents with  . Cough    follow-up. Pt states she feels cough is 50% improved since OV.   rec deslym tsp every 12 hours and tessalon 200 mg 4 x daily to completely eliminate all cough and throat clearing Change zyrtec and take it in am instead of pm Prednisone 10 mg take  4 each am x 2 days,   2 each am x 2 days,  1 each am x 2 days and stop should get you completely over the hump to "undo the snow ball effect"   03/27/2013 f/u ov/Wert re: cough x 10 day Chief Complaint  Patient presents with  . Follow-up    cough is 75% since last ov,denies wheezing or sob,occass. left upper sided chest pain  tession am and pm and during the day "when I can remember it" but not prompted by the cough which is entirely daytime, min productive, only happens 2-3 x  daily s pattern  No sob or need for saba    Kouffman Reflux v Neurogenic Cough Differentiator Reflux Comments  Do you awaken from a sound sleep coughing violently?                            With trouble breathing? no   Do you have choking episodes when you cannot  Get enough air, gasping for air ?              RESOLVED   Do you usually cough when you lie down into  The bed, or when you just lie down to rest ?                          gone   Do you usually cough after meals or eating?         no   Do you cough when (or after) you bend over?    no   GERD SCORE     Kouffman Reflux v Neurogenic Cough Differentiator Neurogenic   Do you more-or-less cough all day long? Down to 2-3 per day   Does change of temperature make you  cough? no   Does laughing or chuckling cause you to cough? no   Do fumes (perfume, automobile fumes, burned  Toast, etc.,) cause you to cough ?      Not sure   Does speaking, singing, or talking on the phone cause you to cough   ?               RESOLVED   Neurogenic/Airway score      >>ry stopping the tessilon and if the cough gets worse we will ramp up your neurontin to a maximum of 300 mg three times a day Continue zyrtec at breakfast and the chlortrimeton 4 mg at bedt  04/24/2013 Follow up And Med Review  Patient returns for followup and medication review. We reviewed all her medications organized them into a medication calendar with patient education.  Appears. Patient is taking her medications correctly. She says, that she is some better, but continues to have postnasal drip, and drainage, and throat clearing throughout the day. She is now taking gabapentin 3 times daily. She denies any fever or hemoptysis, orthopnea, PND, or leg swelling.   Current Medications, Allergies, Complete Past Medical History, Past Surgical History, Family History, and Social History were reviewed in [employer] record.  ROS  The following are not active  complaints unless bolded sore throat, dysphagia, dental problems, itching, sneezing,  nasal congestion or excess/ purulent secretions, ear ache,   fever, chills, sweats, unintended wt loss, pleuritic or exertional cp, hemoptysis,  orthopnea pnd or leg swelling, presyncope, palpitations, heartburn, abdominal pain, anorexia, nausea, vomiting, diarrhea  or change in bowel or urinary habits, change in stools or urine, dysuria,hematuria,  rash, arthralgias, visual complaints, headache, numbness weakness or ataxia or problems with walking or coordination,  change in mood/affect or memory.                   Objective:   Physical Exam  amb wf nad   02/13/2013      236  > 03/27/2013  235 > 04/24/2013 236   HEENT: nl dentition, turbinates, and orophanx. Nl external ear canals without cough reflex   NECK :  without JVD/Nodes/TM/ nl carotid upstrokes bilaterally   LUNGS: no acc muscle use, clear to A and P bilaterally without cough on insp or exp maneuvers   CV:  RRR  no s3 or murmur or increase in P2, no edema   ABD:  soft and nontender with nl excursion in the supine position. No bruits or organomegaly, bowel sounds nl  MS:  warm without deformities, calf tenderness, cyanosis or clubbing  SKIN: warm and dry without lesions             Assessment & Plan:

## 2013-05-02 ENCOUNTER — Other Ambulatory Visit: Payer: Self-pay | Admitting: Internal Medicine

## 2013-05-02 MED ORDER — GABAPENTIN 300 MG PO CAPS: 300.0000 mg | ORAL_CAPSULE | Freq: Three times a day (TID) | ORAL | Status: DC

## 2013-05-12 NOTE — Addendum Note (Signed)
Addended by: Parke Poisson E on: 05/12/2013 11:07 AM   Modules accepted: Orders

## 2013-05-30 ENCOUNTER — Ambulatory Visit (INDEPENDENT_AMBULATORY_CARE_PROVIDER_SITE_OTHER): Payer: BC Managed Care – PPO | Admitting: Internal Medicine

## 2013-05-30 ENCOUNTER — Encounter: Payer: Self-pay | Admitting: Internal Medicine

## 2013-05-30 VITALS — BP 110/72 | HR 89 | Temp 98.5°F | Ht 65.0 in | Wt 239.0 lb

## 2013-05-30 DIAGNOSIS — R059 Cough, unspecified: Secondary | ICD-10-CM

## 2013-05-30 DIAGNOSIS — R058 Other specified cough: Secondary | ICD-10-CM

## 2013-05-30 DIAGNOSIS — R05 Cough: Secondary | ICD-10-CM

## 2013-05-30 MED ORDER — FLUTTER DEVI: Status: DC

## 2013-05-30 MED ORDER — TRAMADOL HCL 50 MG PO TABS: ORAL_TABLET | ORAL | Status: DC

## 2013-05-30 MED ORDER — PREDNISONE 10 MG PO TABS: ORAL_TABLET | ORAL | Status: DC

## 2013-05-30 NOTE — Progress Notes (Addendum)
Subjective:  Patient ID: Kayla Maddox, female    DOB: [birth year]    MRN: [id number]   Brief patient profile:  63 yowf quit smoking 1983 with birth of child with no respiratory problem until around 2004 persistent daily cough since then with w/u by [facility] 2011 and [facility]edge and Pyrtle referred back to pulmonary clinic 01/24/2013 for eval of incessant coughing. Prev eval by alva imp was asthma rec continue advair  History of Present Illness  01/24/2013 1st [facility] office visit/ Kayla Maddox cc chronic cough x 10 years  Daily routine immediate on awakening white thick mucus scant amt Much better since [place] but only 50% No better p sinus surg Cough is day > night, severe enough to cause choking rec Neurontin 100 mg three times a day Stop co q 10 for now  Try chlortrimeton 4 mg one at bedtime and can take it during the day if needed for the sense of throat drainage or tickle  But may make you sleepy Stop qvar for a week what difference if any it makes Continue prilosec Take 30- 60 min before your first and last meals of the day  GERD diet     02/13/2013 f/u ov/Kayla Maddox re: cough x 10 years >"  best in last 4 years " Chief Complaint  Patient presents with  . Cough    follow-up. Pt states she feels cough is 50% improved since OV.   rec deslym tsp every 12 hours and tessalon 200 mg 4 x daily to completely eliminate all cough and throat clearing Change zyrtec and take it in am instead of pm Prednisone 10 mg take  4 each am x 2 days,   2 each am x 2 days,  1 each am x 2 days and stop should get you completely over the hump to "undo the snow ball effect"   03/27/2013 f/u ov/Kayla Maddox re: cough x 10 day Chief Complaint  Patient presents with  . Follow-up    cough is 75% since last ov,denies wheezing or sob,occass. left upper sided chest pain  tession am and pm and during the day "when I can remember it" but not prompted by the cough which is entirely daytime, min productive, only happens 2-3 x  daily s pattern  No sob or need for saba    Kouffman Reflux v Neurogenic Cough Differentiator Reflux Comments  Do you awaken from a sound sleep coughing violently?                            With trouble breathing? no   Do you have choking episodes when you cannot  Get enough air, gasping for air ?              RESOLVED   Do you usually cough when you lie down into  The bed, or when you just lie down to rest ?                          gone   Do you usually cough after meals or eating?         no   Do you cough when (or after) you bend over?    no   GERD SCORE     Kouffman Reflux v Neurogenic Cough Differentiator Neurogenic   Do you more-or-less cough all day long? Down to 2-3 per day   Does change of temperature make you  cough? no   Does laughing or chuckling cause you to cough? no   Do fumes (perfume, automobile fumes, burned  Toast, etc.,) cause you to cough ?      Not sure   Does speaking, singing, or talking on the phone cause you to cough   ?               RESOLVED   Neurogenic/Airway score      >>try stopping the tessilon and if the cough gets worse we will ramp up your neurontin to a maximum of 300 mg three times a day Continue zyrtec at breakfast and the chlortrimeton 4 mg at bedtime  04/24/2013 Follow up And Med Review  Patient returns for followup and medication review. We reviewed all her medications organized them into a medication calendar with patient education.  Appears. Patient is taking her medications correctly. She says, that she is some better, but continues to have postnasal drip, and drainage, and throat clearing throughout the day. She is now taking gabapentin 3 times daily.  rec Increase Chlortabs 60m 2 At bedtime  , may use every 4hr during daytime if needed. May cause sleepiness.  Follow med calendar closely and bring to each visit.   05/30/2013 f/u ov/Kayla Maddox re: cough x 10 y Chief Complaint  Patient presents with  . Cough    Pt states cough is worse for  the past 2 wks.   daytime cough never completed eliminated with constant sensation of pnds daytime only prod min clear mucus not worse in am   Not limited by breathing from desired activities     No obvious day to day or daytime variabilty or assoc chronic cough or cp or chest tightness, subjective wheeze overt sinus or hb symptoms. No unusual exp hx or h/o childhood pna/ asthma or knowledge of premature birth.  Sleeping ok without nocturnal  or early am exacerbation  of respiratory  c/o's or need for noct saba. Also denies any obvious fluctuation of symptoms with weather or environmental changes or other aggravating or alleviating factors except as outlined above   Current Medications, Allergies, Complete Past Medical History, Past Surgical History, Family History, and Social History [facility] [employer] [facility].  ROS  The following are not active complaints unless bolded sore throat, dysphagia, dental problems, itching, sneezing,  nasal congestion or excess but not  purulent secretions, ear ache,   fever, chills, sweats, unintended wt loss, pleuritic or exertional cp, hemoptysis,  orthopnea pnd or leg swelling, presyncope, palpitations, heartburn, abdominal pain, anorexia, nausea, vomiting, diarrhea  or change in bowel or urinary habits, change in stools or urine, dysuria,hematuria,  rash, arthralgias, visual complaints, headache, numbness weakness or ataxia or problems with walking or coordination,  change in mood/affect or memory.                 Objective:   Physical Exam  amb wf nad with honking upper airway cough pattern   02/13/2013      236    Wt Readings from Last 3 Encounters:  05/30/13 239 lb (108.41 kg)  04/24/13 236 lb 12.8 oz (107.412 kg)  03/27/13 235 lb 6.4 oz (106.777 kg)      HEENT: nl dentition, turbinates, and orophanx. Nl external ear canals without cough reflex   NECK :  without JVD/Nodes/TM/ nl carotid upstrokes  bilaterally   LUNGS: no acc muscle use, clear to A and P bilaterally without cough on insp or exp maneuvers  CV:  RRR  no s3 or murmur or increase in P2, no edema   ABD:  soft and nontender with nl excursion in the supine position. No bruits or organomegaly, bowel sounds nl  MS:  warm without deformities, calf tenderness, cyanosis or clubbing  SKIN: warm and dry without lesions        cxr 04/24/13  No active cardiopulmonary disease.      Assessment & Plan:

## 2013-05-30 NOTE — Patient Instructions (Addendum)
Prednisone 10 mg take  4 each am x 2 days,   2 each am x 2 days,  1 each am x 2 days and stop   The key to effective treatment for your cough is eliminating the non-stop cycle of cough you're stuck in long enough to let your airway heal completely and then see if there is anything still making you cough once you stop the cough suppression, but this should take no more than 5 days to figure out  First take delsym two tsp every 12 hours and supplement if needed with  tramadol 50 mg up to 2 every 4 hours to suppress the urge to cough at all or even clear your throat. Swallowing water or using ice chips/non mint and menthol containing candies (such as lifesavers or sugarless jolly ranchers) are also effective.  You should rest your voice and avoid activities that you know make you cough. Once you have eliminated the cough for 3 straight days try reducing the tramadol first,  then the delsym as tolerated.     GERD (REFLUX)  is an extremely common cause of respiratory symptoms, many times with no significant heartburn at all.    It can be treated with medication, but also with lifestyle changes including avoidance of late meals, excessive alcohol, smoking cessation, and avoid fatty foods, chocolate, peppermint, colas, red wine, and acidic juices such as orange juice.  NO MINT OR MENTHOL PRODUCTS SO NO COUGH DROPS  USE SUGARLESS CANDY INSTEAD (jolley ranchers or Stover's)  NO OIL BASED VITAMINS - use powdered substitutes.   Stop neurontin and chlortrimeton  Call for referral to [facility] if not better next week  Late add If worse pnds volume best choice is add back chloretrimeton daytime

## 2013-05-31 NOTE — Assessment & Plan Note (Addendum)
-   trial off qvar and on neurontin 01/25/2013 > improved 02/12/13> worse 05/30/13 so d/c  It turns out she's never been able to eliminate cyclical cough that has a honking upper airway quality x 10 years and is most likely generating enough airway trauma to cause inflammation and sensitivity to what is a totally nl amount of pnds.  Will try cyclical cough regimen and if fails rec referral to voice center [facility]  If worse pnds volume best choice is add back chloretrimeton daytime     Each maintenance medication was reviewed in detail including most importantly the difference between maintenance and as needed and under what circumstances the prns are to be used. This was done in the context of a medication calendar review which provided the patient with a user-friendly unambiguous mechanism for medication administration and reconciliation and provides an action plan for all active problems. It is critical that this be shown to every doctor  for modification during the office visit if necessary so the patient can use it as a working document.

## 2013-06-25 ENCOUNTER — Telehealth: Payer: Self-pay | Admitting: Internal Medicine

## 2013-06-25 DIAGNOSIS — R05 Cough: Secondary | ICD-10-CM

## 2013-06-25 DIAGNOSIS — R058 Other specified cough: Secondary | ICD-10-CM

## 2013-06-25 MED ORDER — TRAMADOL HCL 50 MG PO TABS: ORAL_TABLET | ORAL | Status: DC

## 2013-06-25 NOTE — Telephone Encounter (Signed)
Per OV 05/30/13; Call for referral to [facility] if not better next week  --  Spoke with pt. She is wanting a referral to [facility]. She reports she is not feeling better and it has been about 3 weeks now. C/o prod cough-worse x past couple days.  Pt also requesting refill tramadol. Last refill 05/30/13 #40 x 0 refills Please advise MW thanks

## 2013-06-25 NOTE — Telephone Encounter (Signed)
Referral placed and RX called in. Called pt and she is aware. Nothing further needed

## 2013-06-25 NOTE — Telephone Encounter (Signed)
Fine with me  Ok to refill tramadol x one

## 2013-08-07 ENCOUNTER — Ambulatory Visit (AMBULATORY_SURGERY_CENTER): Payer: BC Managed Care – PPO | Admitting: *Deleted

## 2013-08-07 VITALS — Ht 65.0 in | Wt 236.4 lb

## 2013-08-07 DIAGNOSIS — Z1211 Encounter for screening for malignant neoplasm of colon: Secondary | ICD-10-CM

## 2013-08-07 MED ORDER — MOVIPREP 100 G PO SOLR: ORAL | Status: DC

## 2013-08-11 LAB — HM COLONOSCOPY

## 2013-08-13 ENCOUNTER — Ambulatory Visit (AMBULATORY_SURGERY_CENTER): Payer: BC Managed Care – PPO | Admitting: Internal Medicine

## 2013-08-13 ENCOUNTER — Encounter: Payer: Self-pay | Admitting: Internal Medicine

## 2013-08-13 VITALS — BP 106/54 | HR 88 | Temp 98.8°F | Resp 22 | Ht 65.0 in | Wt 236.0 lb

## 2013-08-13 DIAGNOSIS — D126 Benign neoplasm of colon, unspecified: Secondary | ICD-10-CM

## 2013-08-13 DIAGNOSIS — K5289 Other specified noninfective gastroenteritis and colitis: Secondary | ICD-10-CM

## 2013-08-13 DIAGNOSIS — K529 Noninfective gastroenteritis and colitis, unspecified: Secondary | ICD-10-CM

## 2013-08-13 DIAGNOSIS — Z1211 Encounter for screening for malignant neoplasm of colon: Secondary | ICD-10-CM

## 2013-08-13 MED ORDER — SODIUM CHLORIDE 0.9 % IV SOLN: 500.0000 mL | INTRAVENOUS | Status: DC

## 2013-08-13 NOTE — Progress Notes (Signed)
A/ox3, pleased with MAC, report to RN 

## 2013-08-13 NOTE — Patient Instructions (Signed)
YOU HAD AN ENDOSCOPIC PROCEDURE TODAY AT [facility]: Refer to the procedure report that was given to you for any specific questions about what was found during the examination.  If the procedure report does not answer your questions, please call your gastroenterologist to clarify.  If you requested that your care partner not be given the details of your procedure findings, then the procedure report has been included in a sealed envelope for you to review at your convenience later.  YOU SHOULD EXPECT: Some feelings of bloating in the abdomen. Passage of more gas than usual.  Walking can help get rid of the air that was put into your GI tract during the procedure and reduce the bloating. If you had a lower endoscopy (such as a colonoscopy or flexible sigmoidoscopy) you may notice spotting of blood in your stool or on the toilet paper. If you underwent a bowel prep for your procedure, then you may not have a normal bowel movement for a few days.  DIET: Your first meal following the procedure should be a light meal and then it is ok to progress to your normal diet.  A half-sandwich or bowl of soup is an example of a good first meal.  Heavy or fried foods are harder to digest and may make you feel nauseous or bloated.  Likewise meals heavy in dairy and vegetables can cause extra gas to form and this can also increase the bloating.  Drink plenty of fluids but you should avoid alcoholic beverages for 24 hours.  ACTIVITY: Your care partner should take you home directly after the procedure.  You should plan to take it easy, moving slowly for the rest of the day.  You can resume normal activity the day after the procedure however you should NOT DRIVE or use heavy machinery for 24 hours (because of the sedation medicines used during the test).    SYMPTOMS TO REPORT IMMEDIATELY: A gastroenterologist can be reached at any hour.  During normal business hours, 8:30 AM to 5:00 PM Monday through Friday,  call [phone].  After hours and on weekends, please call the GI answering service at [phone] who will take a message and have the physician on call contact you.   Following lower endoscopy (colonoscopy or flexible sigmoidoscopy):  Excessive amounts of blood in the stool  Significant tenderness or worsening of abdominal pains  Swelling of the abdomen that is new, acute  Fever of 100F or higher   FOLLOW UP: If any biopsies were taken you will be contacted by phone or by letter within the next 1-3 weeks.  Call your gastroenterologist if you have not heard about the biopsies in 3 weeks.  Our staff will call the home number listed on your records the next business day following your procedure to check on you and address any questions or concerns that you may have at that time regarding the information given to you following your procedure. This is a courtesy call and so if there is no answer at the home number and we have not heard from you through the emergency physician on call, we will assume that you have returned to your regular daily activities without incident.  SIGNATURES/CONFIDENTIALITY: You and/or your care partner have signed paperwork which will be entered into your electronic medical record.  These signatures attest to the fact that that the information above on your After Visit Summary has been reviewed and is understood.  Full responsibility of the confidentiality of  this discharge information lies with you and/or your care-partner.  Polyps, diverticulosis, high fiber diet-handouts given  Avoid NSAIDs for 2 weeks (ibuprofen, motrin, advil, aleve, naproxen, mobic, etc)  Repeat colonoscopy will be determined by pathology.   Office follow-up in 2-4 weeks. (office will call with appointment)

## 2013-08-13 NOTE — Op Note (Signed)
[place]  [address] [place], [address]   COLONOSCOPY PROCEDURE REPORT  PATIENT: Kayla Maddox, Kayla Maddox  MR#: [id number] BIRTHDATE: [birth year] , 34  yrs. old GENDER: Female ENDOSCOPIST: Jerene Bears, MD PROCEDURE DATE:  08/13/2013 PROCEDURE:   Colonoscopy with biopsy and Colonoscopy with snare polypectomy First Screening Colonoscopy - Avg.  risk and is 50 yrs.  old or older - No.  Prior Negative Screening - Now for repeat screening. 10 or more years since last screening  History of Adenoma - Now for follow-up colonoscopy & has been > or = to 3 yrs.  N/A  Polyps Removed Today? Yes. ASA CLASS:   Class III INDICATIONS:average risk screening and last colonoscopy 10 years ago, aphthous ulceration felt infectious, hyperplastic polyps. MEDICATIONS: MAC sedation, administered by CRNA and Propofol (Diprivan) 700 mg IV  DESCRIPTION OF PROCEDURE:   After the risks benefits and alternatives of the procedure were thoroughly explained, informed consent was obtained.  A digital rectal exam revealed no rectal mass.   The LB [id number]  endoscope was introduced through the anus and advanced to the cecum, which was identified by both the appendix and ileocecal valve. No adverse events experienced. The quality of the prep was good, using MoviPrep  The instrument was then slowly withdrawn as the colon was fully examined.      COLON FINDINGS: A circumferential segment, approximately 15 cm in length, of abnormal mucosa was found at the hepatic flexure and in the proximal transverse colon.  The mucosa was erythematous, friable and had superficial ulcers, loss of vascularity and granularity.  This was likely consistent with Inflammatory Bowel disease.  Multiple biopsies of the area were performed using cold forceps.   The colonic mucosa appeared normal at the cecum, in the ascending colon, distal transverse colon, descending colon, sigmoid colon, and rectum.  Multiple random  biopsies of the area were performed from the unaffected parts of the right and left colon/rectum and placed in separate jars.   Two sessile polyps measuring 9 and 3 mm in size were found in the ascending colon. Polypectomy was performed using hot snare and with cold forceps. All resections were complete and all polyp tissue was completely retrieved.   Five sessile polyps measuring 4-8 mm in size were found in the distal transverse colon, descending colon, and sigmoid colon.  Polypectomy was performed using cold snare.  All resections were complete and all polyp tissue was completely retrieved.   Six sessile polyps measuring 5-10 mm in size were found in the rectosigmoid colon.  Polypectomy was performed using cold snare (2) and using hot snare (4).  All resections were complete and all polyp tissue was completely retrieved.   Mild diverticulosis was noted in the descending colon and sigmoid colon.  Retroflexed views revealed no abnormalities. The time to cecum=5 minutes 50 seconds. Withdrawal time=34 minutes 09 seconds.  The scope was withdrawn and the procedure completed. COMPLICATIONS: There were no complications.  ENDOSCOPIC IMPRESSION: 1.   Segmental colitis was found at the hepatic flexure and in the proximal transverse colon; multiple biopsies of the area were performed using cold forceps 2.   The colonic mucosa appeared normal at the cecum, in the ascending colon, distal transverse colon, descending colon, sigmoid colon, and rectum; multiple random biopsies of the area were performed 3.   Two sessile polyps measuring 9 and 3 mm in size were found in the ascending colon; Polypectomy was performed using hot snare and with cold forceps 4.  Five sessile polyps measuring 4-8 mm in size were found in the distal transverse colon, descending colon, and sigmoid colon; Polypectomy was performed using cold snare 5.   Six sessile polyps measuring 5-10 mm in size were found in  the rectosigmoid colon; Polypectomy was performed using cold snare and using hot snare 6.   Mild diverticulosis was noted in the descending colon and sigmoid colon  RECOMMENDATIONS: 1.  Avoid all NSAIDS for the next 2 weeks. 2.  Await pathology results 3.  Timing of repeat colonoscopy will be determined by pathology findings. 4.  You will receive a letter within 1-2 weeks with the results of your biopsy as well as final recommendations.  Please call my office if you have not received a letter after 3 weeks. 5.  Office follow-up in 2-4 weeks   eSigned:  Jerene Bears, MD 08/13/2013 11:25 AM   cc: The Patient; Kennith Maes, MD   PATIENT NAME:  Kayla Maddox, Kayla Maddox MR#: [id number]

## 2013-08-13 NOTE — Progress Notes (Signed)
Called to room to assist during endoscopic procedure.  Patient ID and intended procedure confirmed with present staff. Received instructions for my participation in the procedure from the performing physician.  

## 2013-08-14 ENCOUNTER — Telehealth: Payer: Self-pay

## 2013-08-14 NOTE — Telephone Encounter (Signed)
  Follow up Call-  Call back number 08/13/2013 01/01/2013  Post procedure Call Back phone  # [phone]  Permission to leave phone message Yes Yes     Patient questions:  Do you have a fever, pain , or abdominal swelling? no Pain Score  0 *  Have you tolerated food without any problems? yes  Have you been able to return to your normal activities? yes  Do you have any questions about your discharge instructions: Diet   no Medications  no Follow up visit  no  Do you have questions or concerns about your Care? no  Actions: * If pain score is 4 or above: No action needed, pain <4.  No problems per the pt. Maw

## 2013-08-19 ENCOUNTER — Other Ambulatory Visit: Payer: Self-pay | Admitting: Gastroenterology

## 2013-08-21 ENCOUNTER — Encounter: Payer: Self-pay | Admitting: Internal Medicine

## 2013-08-22 ENCOUNTER — Other Ambulatory Visit: Payer: Self-pay

## 2013-08-22 MED ORDER — MESALAMINE 1.2 G PO TBEC: 4.8000 g | DELAYED_RELEASE_TABLET | Freq: Every day | ORAL | Status: DC

## 2013-08-28 ENCOUNTER — Telehealth: Payer: Self-pay | Admitting: Internal Medicine

## 2013-08-28 NOTE — Telephone Encounter (Signed)
Patient notified that I will put another card out front.

## 2013-09-29 DIAGNOSIS — R059 Cough, unspecified: Secondary | ICD-10-CM | POA: Insufficient documentation

## 2013-10-13 ENCOUNTER — Encounter: Payer: Self-pay | Admitting: Internal Medicine

## 2013-10-16 ENCOUNTER — Encounter: Payer: Self-pay | Admitting: *Deleted

## 2013-10-21 ENCOUNTER — Ambulatory Visit (INDEPENDENT_AMBULATORY_CARE_PROVIDER_SITE_OTHER): Payer: BC Managed Care – PPO | Admitting: Internal Medicine

## 2013-10-21 ENCOUNTER — Encounter: Payer: Self-pay | Admitting: Internal Medicine

## 2013-10-21 VITALS — BP 122/70 | HR 92 | Ht 64.25 in | Wt 225.4 lb

## 2013-10-21 DIAGNOSIS — K5010 Crohn's disease of large intestine without complications: Secondary | ICD-10-CM

## 2013-10-21 DIAGNOSIS — D126 Benign neoplasm of colon, unspecified: Secondary | ICD-10-CM | POA: Insufficient documentation

## 2013-10-21 DIAGNOSIS — K219 Gastro-esophageal reflux disease without esophagitis: Secondary | ICD-10-CM | POA: Insufficient documentation

## 2013-10-21 DIAGNOSIS — Z86010 Personal history of colonic polyps: Secondary | ICD-10-CM

## 2013-10-21 MED ORDER — MESALAMINE 1.2 G PO TBEC: 4.8000 g | DELAYED_RELEASE_TABLET | Freq: Every day | ORAL | Status: DC

## 2013-10-21 NOTE — Patient Instructions (Addendum)
Continue Lialda at 4.8 g (4 tablets) daily x 4 more months-until 02/20/2014 (6 months total). You should then decrease your dosage to 2.4 g (2 tablets) daily thereafter.   You will be due for office visit with Dr Hilarie Fredrickson in 6 months. We will send you a reminder in the mail when it gets closer to that time.  CC:Dr Helene Kelp

## 2013-10-21 NOTE — Progress Notes (Signed)
Subjective:    Patient ID: Kayla Maddox, female    DOB: 06-29-1945, 68 y.o.   MRN: [id number]  HPI Kayla Maddox is a 68 yo female seen previously in October 2014 to evaluate possible reflux and chronic cough. After this visit an upper endoscopy was performed on 01/01/2013 which showed a variable Z line, normal esophageal mucosa, multiple sessile polyps in the proximal stomach, nodular antral gastritis, and mild duodenitis. 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 Antral biopsies showed chronic focally active gastritis negative for H. pylori. The stomach polyps were fundic gland polyps. She returned for screening colonoscopy, average risk, on 08/13/2013. On her previous colonoscopy she did have scattered aphthous ulceration which was felt to be infectious in etiology. On colonoscopy she was found to have a 15 cm segment of universal colitis at the hepatic flexure and proximal transverse colon. This was moderate in intensity and biopsies were consistent with chronic active colitis. She also had other polyps removed both from the right and left colon which were tubular adenoma x1, sessile serrated polyps without cytologic dysplasia, and hyperplastic polyps. After pathology results returned she was started on Lialda 4.8 g daily and scheduled for followup.  Today she reports she is feeling well. At the time of her colonoscopy after the findings, I inquired about colitis symptoms which were recently negative. Now she is feeling better and is better able to identify that before colonoscopy she was having nonspecific mid abdominal discomfort and erratic bowel movements. Before colonoscopy she was having some loose stools alternating with hard stools. Since being on Lialda she reports resolution of all abdominal pain. Bowel movements have also improved she is now having one to 2 soft but formed stools daily. No blood or melena. No fevers or chills. No oral ulcers, new joint pains or  rashes. She denies a family history of IBD   Review of Systems As per history of present illness, otherwise negative  Current Medications, Allergies, Past Medical History, Past Surgical History, Family History and Social History were reviewed in [employer] record.     Objective:   Physical Exam BP 122/70  Pulse 92  Ht 5' 4.25" (1.632 m)  Wt 225 lb 6 oz (102.229 kg)  BMI 38.38 kg/m2 Constitutional: Well-developed and well-nourished. No distress. HEENT: Normocephalic and atraumatic. Oropharynx is clear and moist. No oropharyngeal exudate. Conjunctivae are normal.  No scleral icterus. Neck: Neck supple. Trachea midline. Cardiovascular: Normal rate, regular rhythm and intact distal pulses. No M/R/G Pulmonary/chest: Effort normal and breath sounds normal. No wheezing, rales or rhonchi. Abdominal: Soft, obese, nontender, nondistended. Bowel sounds active throughout. Extremities: no clubbing, cyanosis, or edema Lymphadenopathy: No cervical adenopathy noted. Neurological: Alert and oriented to person place and time. Skin: Skin is warm and dry. No rashes noted. Psychiatric: Normal mood and affect. Behavior is normal.      Assessment & Plan:  68 yo female with past medical history of GERD with chronic cough, sleep apnea, hypertension, hyperlipidemia and recent diagnosis of segmental colitis, se consistent with Crohn's colitis who is here for followup.   1. Crohn's colitis, segmental (hepatic flexure/transverse colon) -- she has improved on Lialda 4.8 g daily. We had a discussion today of Crohn's disease. Fortunately this does not involve a significant length of her colon. At this time I do not feel we need to escalate therapy to immunomodulator or biologics.  We discussed how Crohn's disease can flare and behaved, and I asked that she notify me  should she develop abdominal pain, change in bowel habits, blood in her stool, fever or chills. I will have her continue Lialda  4.8 g daily for a total of 6 months and then reduce to 2.4 mg daily. Followup in 6 months. Sooner if necessary. Annual flu vaccine recommended in the fall  2. GERD -- stable on omeprazole 20 mg twice a day. She will continue this dose  3.  Adenomatous colon polyps -- pathology reviewed, surveillance colonoscopy recommended in 3 years

## 2013-10-28 ENCOUNTER — Encounter: Payer: Self-pay | Admitting: Family Medicine

## 2013-10-28 ENCOUNTER — Ambulatory Visit (INDEPENDENT_AMBULATORY_CARE_PROVIDER_SITE_OTHER): Payer: BC Managed Care – PPO | Admitting: Family Medicine

## 2013-10-28 VITALS — BP 124/76 | HR 82 | Temp 98.0°F | Resp 16 | Ht 64.5 in | Wt 226.0 lb

## 2013-10-28 DIAGNOSIS — G4730 Sleep apnea, unspecified: Secondary | ICD-10-CM

## 2013-10-28 DIAGNOSIS — K5010 Crohn's disease of large intestine without complications: Secondary | ICD-10-CM

## 2013-10-28 DIAGNOSIS — F411 Generalized anxiety disorder: Secondary | ICD-10-CM

## 2013-10-28 DIAGNOSIS — I10 Essential (primary) hypertension: Secondary | ICD-10-CM

## 2013-10-28 DIAGNOSIS — F329 Major depressive disorder, single episode, unspecified: Secondary | ICD-10-CM | POA: Insufficient documentation

## 2013-10-28 DIAGNOSIS — Z23 Encounter for immunization: Secondary | ICD-10-CM

## 2013-10-28 DIAGNOSIS — E785 Hyperlipidemia, unspecified: Secondary | ICD-10-CM

## 2013-10-28 DIAGNOSIS — F419 Anxiety disorder, unspecified: Secondary | ICD-10-CM

## 2013-10-28 LAB — CBC WITH DIFFERENTIAL/PLATELET
BASOS ABS: 0 10*3/uL (ref 0.0–0.1)
Basophils Relative: 0.6 % (ref 0.0–3.0)
EOS ABS: 0.1 10*3/uL (ref 0.0–0.7)
Eosinophils Relative: 1.7 % (ref 0.0–5.0)
HEMATOCRIT: 43.3 % (ref 36.0–46.0)
Hemoglobin: 14.8 g/dL (ref 12.0–15.0)
LYMPHS ABS: 2 10*3/uL (ref 0.7–4.0)
Lymphocytes Relative: 24.7 % (ref 12.0–46.0)
MCHC: 34.1 g/dL (ref 30.0–36.0)
MCV: 84.1 fl (ref 78.0–100.0)
Monocytes Absolute: 0.5 10*3/uL (ref 0.1–1.0)
Monocytes Relative: 6.1 % (ref 3.0–12.0)
Neutro Abs: 5.4 10*3/uL (ref 1.4–7.7)
Neutrophils Relative %: 66.9 % (ref 43.0–77.0)
Platelets: 249 10*3/uL (ref 150.0–400.0)
RBC: 5.14 Mil/uL — ABNORMAL HIGH (ref 3.87–5.11)
RDW: 13.6 % (ref 11.5–15.5)
WBC: 8 10*3/uL (ref 4.0–10.5)

## 2013-10-28 LAB — LIPID PANEL
CHOL/HDL RATIO: 4
CHOLESTEROL: 184 mg/dL (ref 0–200)
HDL: 49.1 mg/dL (ref >39.00)
LDL CALC: 101 mg/dL — AB (ref 0–99)
NonHDL: 134.9
TRIGLYCERIDES: 172 mg/dL — AB (ref 0.0–149.0)
VLDL: 34.4 mg/dL (ref 0.0–40.0)

## 2013-10-28 LAB — BASIC METABOLIC PANEL
BUN: 10 mg/dL (ref 6–23)
CHLORIDE: 99 meq/L (ref 96–112)
CO2: 32 mEq/L (ref 19–32)
Calcium: 9.1 mg/dL (ref 8.4–10.5)
Creatinine, Ser: 0.8 mg/dL (ref 0.4–1.2)
GFR: 72.99 mL/min (ref >60.00)
Glucose, Bld: 101 mg/dL — ABNORMAL HIGH (ref 70–99)
POTASSIUM: 3 meq/L — AB (ref 3.5–5.1)
Sodium: 140 mEq/L (ref 135–145)

## 2013-10-28 LAB — HEPATIC FUNCTION PANEL
ALK PHOS: 69 U/L (ref 39–117)
ALT: 20 U/L (ref 0–35)
AST: 19 U/L (ref 0–37)
Albumin: 3.8 g/dL (ref 3.5–5.2)
Bilirubin, Direct: 0.1 mg/dL (ref 0.0–0.3)
Total Bilirubin: 0.9 mg/dL (ref 0.2–1.2)
Total Protein: 7.3 g/dL (ref 6.0–8.3)

## 2013-10-28 LAB — TSH: TSH: 2.11 u[IU]/mL (ref 0.35–4.50)

## 2013-10-28 MED ORDER — ALPRAZOLAM 0.25 MG PO TABS
0.2500 mg | ORAL_TABLET | Freq: Every day | ORAL | Status: DC | PRN
Start: 2013-10-28 — End: 2014-06-12

## 2013-10-28 MED ORDER — ALBUTEROL SULFATE HFA 108 (90 BASE) MCG/ACT IN AERS: 2.0000 | INHALATION_SPRAY | RESPIRATORY_TRACT | Status: DC | PRN

## 2013-10-28 MED ORDER — SIMVASTATIN 20 MG PO TABS: 20.0000 mg | ORAL_TABLET | Freq: Every day | ORAL | Status: DC

## 2013-10-28 MED ORDER — HYDROCHLOROTHIAZIDE 25 MG PO TABS: 25.0000 mg | ORAL_TABLET | Freq: Every day | ORAL | Status: DC

## 2013-10-28 MED ORDER — FLUTICASONE PROPIONATE 50 MCG/ACT NA SUSP: 2.0000 | Freq: Every day | NASAL | Status: DC

## 2013-10-28 NOTE — Patient Instructions (Signed)
Schedule your complete physical in 6 months We'll notify you of your lab results and make any changes if needed Please have Dr Garwin Brothers send me copies of her notes so I can follow along Try and make healthy food choices and get regular activity Call with any questions or concerns Welcome!  We're glad to have you!

## 2013-10-28 NOTE — Assessment & Plan Note (Signed)
New to provider, ongoing for pt.  Following w/ Dr Hilarie Fredrickson.  Pt denies current sxs.

## 2013-10-28 NOTE — Assessment & Plan Note (Signed)
New to provider, ongoing for pt.  Refill on xanax provided.

## 2013-10-28 NOTE — Progress Notes (Signed)
Pre visit review using our clinic review tool, if applicable. No additional management support is needed unless otherwise documented below in the visit note. 

## 2013-10-28 NOTE — Assessment & Plan Note (Signed)
New to provider, ongoing for pt.  Well controlled.  Asymptomatic.  Check labs.  No anticipated med changes.

## 2013-10-28 NOTE — Assessment & Plan Note (Signed)
New to provider, pt w/ hx of similar and was previously using CPAP.  Pt stopped this- no reason given.  Will re-refer to pulmonary for evaluation and re-initiation of tx if needed.  Stressed importance of this dx and it's impact on multiple comorbidities- HTN, obesity included.

## 2013-10-28 NOTE — Progress Notes (Signed)
   Subjective:    Patient ID: Kayla Maddox, female    DOB: 05/19/1945, 68 y.o.   MRN: [id number]  HPI New to establish.  Previous MD- Helene Kelp, [facility].  Prior to that, [facility] in [place].  UTD on mammo, colonoscopy, due for pap but has appt upcoming w/ Dr Garwin Brothers.  Hyperlipidemia- chronic problem, on simvastatin daily.  No abd pain, N/V, myalgias  HTN- chronic problem, on HCTZ.  No CP, + SOB.  No HAs, visual changes, edema.  OSA- chronic problem, not using CPAP, 'i sorta quit'.  + daytime sleepiness, + snoring.  Last sleep study ~5 yrs ago (done at [facility]).  Panic attacks- pt reports these occur 'occasionally'  Will keep xanax on hand to use PRN but medication is way out of date.  Using Amitriptyline nightly for sleep.  Crohn's- on Mesalamine, following w/ Dr Hilarie Fredrickson.  Currently asymptomatic.  Seasonal allergies- chronic problem, on Flonase and Claritin daily.   Review of Systems For ROS see HPI     Objective:   Physical Exam  Vitals reviewed. Constitutional: She is oriented to person, place, and time. She appears well-developed and well-nourished. No distress.  obese  HENT:  Head: Normocephalic and atraumatic.  Eyes: Conjunctivae and EOM are normal. Pupils are equal, round, and reactive to light.  Neck: Normal range of motion. Neck supple. No thyromegaly present.  Short, thick neck  Cardiovascular: Normal rate, regular rhythm, normal heart sounds and intact distal pulses.   No murmur heard. Pulmonary/Chest: Effort normal and breath sounds normal. No respiratory distress.  Abdominal: Soft. She exhibits no distension. There is no tenderness.  Musculoskeletal: She exhibits no edema.  Lymphadenopathy:    She has no cervical adenopathy.  Neurological: She is alert and oriented to person, place, and time.  Skin: Skin is warm and dry.  Psychiatric: She has a normal mood and affect. Her behavior is normal.          Assessment & Plan:

## 2013-10-28 NOTE — Assessment & Plan Note (Signed)
New to provider, chronic problem for pt.  Tolerating statin w/o difficulty.  Check labs.  Adjust meds prn

## 2013-10-30 ENCOUNTER — Other Ambulatory Visit: Payer: Self-pay | Admitting: General Practice

## 2013-10-30 MED ORDER — POTASSIUM CHLORIDE CRYS ER 20 MEQ PO TBCR: 20.0000 meq | EXTENDED_RELEASE_TABLET | Freq: Every day | ORAL | Status: DC

## 2013-11-03 ENCOUNTER — Telehealth: Payer: Self-pay | Admitting: Family Medicine

## 2013-11-03 NOTE — Telephone Encounter (Signed)
Caller name: Judaea  Call back number: [phone]   Reason for call:   Pt seen on 8/18.  Pt is having back spasms and is wanting a muscle relaxer.

## 2013-11-03 NOTE — Telephone Encounter (Signed)
If not reported on visit and I have not seen her. I am not willing to write muscle relaxant.

## 2013-11-03 NOTE — Telephone Encounter (Signed)
No note of pt complaining of this symptom. Please advise if she needs an appt with you to examine

## 2013-11-04 NOTE — Telephone Encounter (Signed)
Unfortunately, this was not discussed at her OV and is not on her historical med list.  If pt is having symptoms that warrant prescription medication, she will need appt

## 2013-11-04 NOTE — Telephone Encounter (Signed)
Pt needs an appt

## 2013-11-05 NOTE — Telephone Encounter (Signed)
Pt wanted to inform Dr. Birdie Riddle she is in pain and can not move will try to make it in, acute appointment scheduled for 11/06/13 with Johnnette Gourd. At 9:45am

## 2013-11-05 NOTE — Telephone Encounter (Signed)
Noted, thanks!

## 2013-11-06 ENCOUNTER — Encounter: Payer: Self-pay | Admitting: Medical

## 2013-11-06 ENCOUNTER — Ambulatory Visit (INDEPENDENT_AMBULATORY_CARE_PROVIDER_SITE_OTHER): Payer: BC Managed Care – PPO | Admitting: Medical

## 2013-11-06 VITALS — BP 140/80 | HR 92 | Temp 98.2°F | Wt 225.0 lb

## 2013-11-06 DIAGNOSIS — M5430 Sciatica, unspecified side: Secondary | ICD-10-CM

## 2013-11-06 DIAGNOSIS — M549 Dorsalgia, unspecified: Secondary | ICD-10-CM

## 2013-11-06 DIAGNOSIS — M5440 Lumbago with sciatica, unspecified side: Secondary | ICD-10-CM

## 2013-11-06 MED ORDER — HYDROCODONE-ACETAMINOPHEN 5-325 MG PO TABS: 1.0000 | ORAL_TABLET | Freq: Four times a day (QID) | ORAL | Status: DC | PRN

## 2013-11-06 MED ORDER — KETOROLAC TROMETHAMINE 60 MG/2ML IM SOLN
60.0000 mg | Freq: Once | INTRAMUSCULAR | Status: AC
  Administered 2013-11-06: 60 mg via INTRAMUSCULAR

## 2013-11-06 MED ORDER — CYCLOBENZAPRINE HCL 5 MG PO TABS: 5.0000 mg | ORAL_TABLET | Freq: Every day | ORAL | Status: DC

## 2013-11-06 MED ORDER — DICLOFENAC SODIUM 75 MG PO TBEC: 75.0000 mg | DELAYED_RELEASE_TABLET | Freq: Two times a day (BID) | ORAL | Status: DC

## 2013-11-06 NOTE — Progress Notes (Signed)
Pre visit review using our clinic review tool, if applicable. No additional management support is needed unless otherwise documented below in the visit note. 

## 2013-11-06 NOTE — Patient Instructions (Signed)
For your back pain we gave you toradol im.Tomorrow you can start diclofenac for inflammation. Please do not take any otc nsaids while on diclofenac. You can use muscle relaxant at night but none during the day. For severe pain prescribed norco. If your pain persists by 7 days would get lumbar xray. If at any point severe pain with leg weakness, foot drop, numbness between legs or incontinence then ED evaluation.

## 2013-11-06 NOTE — Assessment & Plan Note (Signed)
Toradol im in office. Start diclofenac tomorrow. Rx norco for severe pain. Cyclobenzaprene q hs prn muscle spasms.

## 2013-11-06 NOTE — Progress Notes (Signed)
   Subjective:    Patient ID: Kayla Maddox, female    DOB: 08-18-1945, 68 y.o.   MRN: [id number]  HPI  Pt in with some back pain. Lumbar pain toward both sides of lower back. Toward hip area. Pt pain since Monday. No fall or trauma. No exercise. Pt has history of back pain in past and some surgery. Hx of bulging disc. Surgery about 25 years ago. Pt states back pain controlled since then. States some pain maybe 1 time every 2 years. Usually needs medications. Some pain radiating toward rt thigh. No saddle anesthesia. No incontinence or leg weakness.    Review of Systems  Constitutional: Negative for fever, chills and fatigue.  Respiratory: Negative for cough, shortness of breath and wheezing.   Cardiovascular: Negative for chest pain and palpitations.  Gastrointestinal: Negative.   Endocrine: Negative for polydipsia, polyphagia and polyuria.  Genitourinary: Negative.   Musculoskeletal: Positive for back pain.       Mid lumbar and bilateral si area pain.  Skin: Negative.   Neurological: Negative for dizziness, seizures, syncope, facial asymmetry, speech difficulty, weakness, light-headedness, numbness and headaches.       Some radiating pain from her lower back toward rt anterior thigh.  Hematological: Negative for adenopathy. Does not bruise/bleed easily.       Objective:   Physical Exam  Constitutional: She is oriented to person, place, and time. She appears well-developed and well-nourished. No distress.  HENT:  Head: Normocephalic and atraumatic.  Cardiovascular: Normal rate, regular rhythm and normal heart sounds.   Pulmonary/Chest: Effort normal and breath sounds normal. No respiratory distress. She has no wheezes. She has no rales.  Abdominal: She exhibits no distension.  Musculoskeletal:  Mid lumbar tenderness to palpation. Bilateral si tenderness to palpation. Obvious pain standing from seated position. She states can't recline to supine position due to severe pain.    Neurological: She is alert and oriented to person, place, and time.  Lower ext- normal patella reflexes, L5- S1 sensation intact bilaterally. No foot drop. Equal and symmetri 5/5 lower ext strength.  Skin: She is not diaphoretic.           Assessment & Plan:

## 2013-11-06 NOTE — Addendum Note (Signed)
Addended by: Peggyann Shoals on: 11/06/2013 10:20 AM   Modules accepted: Orders

## 2013-11-10 LAB — HM PAP SMEAR: HM Pap smear: NORMAL

## 2013-12-04 ENCOUNTER — Ambulatory Visit (INDEPENDENT_AMBULATORY_CARE_PROVIDER_SITE_OTHER): Payer: BC Managed Care – PPO | Admitting: Pulmonary Disease

## 2013-12-04 ENCOUNTER — Encounter: Payer: Self-pay | Admitting: Pulmonary Disease

## 2013-12-04 VITALS — BP 132/80 | HR 89 | Ht 65.0 in | Wt 225.2 lb

## 2013-12-04 DIAGNOSIS — G4733 Obstructive sleep apnea (adult) (pediatric): Secondary | ICD-10-CM

## 2013-12-04 NOTE — Assessment & Plan Note (Signed)
She has snoring, sleep disruption, witnessed apnea, and daytime sleepiness.  She has hx of HTN and prior history of OSA.  She likely still has OSA.  Her BMI is > 35.  We discussed how sleep apnea can affect various health problems including risks for hypertension, cardiovascular disease, and diabetes.  Also discussed how sleep apnea can contribute to nocturnal cough and reflux symptoms.  We also discussed how sleep disruption can increase risks for accident, such as while driving.  Weight loss as a means of improving sleep apnea was also reviewed.  Additional treatment options discussed were CPAP therapy, oral appliance, and surgical intervention.  To further assess will arrange for home sleep study pending insurance approval.

## 2013-12-04 NOTE — Progress Notes (Deleted)
   Subjective:    Patient ID: Kayla Maddox, female    DOB: 06/16/1945, 68 y.o.   MRN: [id number]  HPI    Review of Systems  Constitutional: Negative for fever and unexpected weight change.  HENT: Positive for trouble swallowing. Negative for congestion, dental problem, ear pain, nosebleeds, postnasal drip, rhinorrhea, sinus pressure, sneezing and sore throat.   Eyes: Negative for redness and itching.  Respiratory: Positive for cough and shortness of breath. Negative for chest tightness and wheezing.   Cardiovascular: Negative for palpitations and leg swelling.  Gastrointestinal: Negative for nausea and vomiting.  Genitourinary: Negative for dysuria.  Musculoskeletal: Positive for arthralgias and joint swelling.  Skin: Negative for rash.  Neurological: Negative for headaches.  Hematological: Does not bruise/bleed easily.  Psychiatric/Behavioral: Negative for dysphoric mood. The patient is nervous/anxious.        Objective:   Physical Exam        Assessment & Plan:

## 2013-12-04 NOTE — Patient Instructions (Signed)
Will arrange for home sleep study Will call to arrange for follow up after sleep study reviewed  

## 2013-12-04 NOTE — Progress Notes (Signed)
Chief Complaint  Patient presents with  . SLEEP CONSULT    Referred by Dr Birdie Riddle. Sleep study 2011. Epworth Score: 8    History of Present Illness: Kayla Maddox is a 68 y.o. female for evaluation of sleep problems.  She has history of sleep apnea.  She had sleep study in 2011 which showed severe sleep apnea.  She used CPAP until about 1.5 years ago.  She had nasal pillow mask >> only tried one mask type, and never offered alternative by DME.  She was not able to get comfortable with her mask.  As a result she stopped using CPAP.  She was seen recently by her PCP and advised she needed further assessment for sleep apnea.  She has trouble falling asleep and staying asleep.  She has to sleep in a recliner due to back pain issues, but says she can also breath better when she sits up sleeping.    She snores, and will stop breathing while asleep.  Her husband has to wake her up sometimes to breath.  She feels tired during the day.  She has noticed more trouble with concentration at work, and has to keep herself busy when she comes home to avoid falling asleep.  She goes to sleep at between 930 and 1030 pm.  She falls asleep after about 30 minutes.  She wakes up 1 or 2 times to use the bathroom.  She gets out of bed at 630 am.  She feels tired in the morning.  She denies morning headache.  She does not use anything to help her fall sleep or stay awake.  She denies sleep walking, sleep talking, bruxism, or nightmares.  There is no history of restless legs.  She denies sleep hallucinations, sleep paralysis, or cataplexy.  The Epworth score is 13 out of 24.   Kayla Maddox  has a past medical history of Anxiety; Allergic rhinitis; Hypertension; Hyperlipidemia; Obesity; Sleep apnea; Diverticulosis; Hyperplastic colon polyp; Tubular adenoma of colon; Chronic colitis; and Gastric heterotopia.  Kayla Maddox  has past surgical history that includes Nasal sinus surgery (2012); Foot surgery (Left, 2005); Spine  surgery (1994); and Bladder suspension (2011).  Prior to Admission medications   Medication Sig Start Date End Date Taking? Authorizing Provider  albuterol (PROVENTIL HFA;VENTOLIN HFA) 108 (90 BASE) MCG/ACT inhaler Inhale 2 puffs into the lungs every 4 (four) hours as needed for wheezing or shortness of breath. 10/28/13  Yes Midge Minium, MD  ALPRAZolam Duanne Moron) 0.25 MG tablet Take 1 tablet (0.25 mg total) by mouth daily as needed for anxiety. 10/28/13  Yes Midge Minium, MD  amitriptyline (ELAVIL) 10 MG tablet Take 10 mg by mouth at bedtime.   Yes Historical Provider, MD  benzonatate (TESSALON) 200 MG capsule Take 200 mg by mouth 3 (three) times daily as needed for cough.   Yes Historical Provider, MD  dextromethorphan (DELSYM) 30 MG/5ML liquid Take 30 mg by mouth 2 (two) times daily.   Yes Historical Provider, MD  fluticasone (FLONASE) 50 MCG/ACT nasal spray Place 2 sprays into both nostrils daily. 10/28/13  Yes Midge Minium, MD  hydrochlorothiazide (HYDRODIURIL) 25 MG tablet Take 1 tablet (25 mg total) by mouth daily. 10/28/13  Yes Midge Minium, MD  loratadine (CLARITIN) 10 MG tablet Take 10 mg by mouth daily.   Yes Historical Provider, MD  mesalamine (LIALDA) 1.2 G EC tablet Take 4 tablets (4.8 g total) by mouth daily with breakfast. 10/21/13  Yes Lajuan Lines  Pyrtle, MD  omeprazole (PRILOSEC) 20 MG capsule Take 20 mg by mouth 2 (two) times daily.    Yes Historical Provider, MD  potassium chloride SA (K-DUR,KLOR-CON) 20 MEQ tablet Take 1 tablet (20 mEq total) by mouth daily. 10/30/13  Yes Midge Minium, MD  Respiratory Therapy Supplies (FLUTTER) DEVI Use as directed 05/30/13  Yes Tanda Rockers, MD  simvastatin (ZOCOR) 20 MG tablet Take 1 tablet (20 mg total) by mouth at bedtime. 10/28/13  Yes Midge Minium, MD    Allergies  Allergen Reactions  . Amoxicillin Rash  . Clarithromycin Rash  . Doxycycline Rash  . Prednisone Rash  . Sulfonamide Derivatives Rash    Her  family history includes Diabetes in her father; Heart disease in her father and mother; Hypertension in her father and mother. There is no history of Colon cancer, Esophageal cancer, Rectal cancer, or Stomach cancer.  She  reports that she quit smoking about 32 years ago. Her smoking use included Cigarettes. She has a 20 pack-year smoking history. She has never used smokeless tobacco. She reports that she does not drink alcohol or use illicit drugs.  Review of Systems  Constitutional: Negative for fever and unexpected weight change.  HENT: Positive for trouble swallowing. Negative for congestion, dental problem, ear pain, nosebleeds, postnasal drip, rhinorrhea, sinus pressure, sneezing and sore throat.   Eyes: Negative for redness and itching.  Respiratory: Positive for cough and shortness of breath. Negative for chest tightness and wheezing.   Cardiovascular: Negative for palpitations and leg swelling.  Gastrointestinal: Negative for nausea and vomiting.  Genitourinary: Negative for dysuria.  Musculoskeletal: Positive for arthralgias and joint swelling.  Skin: Negative for rash.  Neurological: Negative for headaches.  Hematological: Does not bruise/bleed easily.  Psychiatric/Behavioral: Negative for dysphoric mood. The patient is nervous/anxious.    Physical Exam:  General - No distress ENT - No sinus tenderness, no oral exudate, no LAN, no thyromegaly, TM clear, pupils equal/reactive, MP 3, scalloped tongue Cardiac - s1s2 regular, no murmur, pulses symmetric Chest - No wheeze/rales/dullness, good air entry, normal respiratory excursion Back - No focal tenderness Abd - Soft, non-tender, no organomegaly, + bowel sounds Ext - No edema Neuro - Normal strength, cranial nerves intact Skin - No rashes Psych - Normal mood, and behavior  Assessment/plan:  Chesley Mires, M.D. Pager [phone]

## 2013-12-10 LAB — HM MAMMOGRAPHY

## 2013-12-29 ENCOUNTER — Telehealth: Payer: Self-pay | Admitting: Pulmonary Disease

## 2013-12-29 NOTE — Telephone Encounter (Signed)
Requesting status on approval of home sleep study-- states that she spoke with Alida after her visit with VS and was advised that insurance would have to be contacted before test could be scheduled. Please advise Alida. Thanks.

## 2013-12-30 NOTE — Telephone Encounter (Signed)
Spoke with patient, she  is scheduled to pickup home sleep test wed 12/31/13. Kayla Maddox

## 2013-12-31 DIAGNOSIS — G4730 Sleep apnea, unspecified: Secondary | ICD-10-CM

## 2014-01-01 ENCOUNTER — Telehealth: Payer: Self-pay | Admitting: Pulmonary Disease

## 2014-01-01 DIAGNOSIS — G4733 Obstructive sleep apnea (adult) (pediatric): Secondary | ICD-10-CM

## 2014-01-01 NOTE — Telephone Encounter (Signed)
HST 12/31/13 >> AHI 34.4, SaO2 low 69%.  Will have my nurse inform pt that sleep study shows severe sleep apnea.  Options at this time are 1) arrange for auto CPAP now and ROV in 2 months after CPAP set up, or 2) ROV first.  If pt is agreeable to auto CPAP, then send order for auto CPAP with pressure range of 5 to 20 cm H2O with heated humidity and mask of choice.  Have download sent 1 month after starting CPAP and ROV 2 months after starting CPAP.

## 2014-01-08 ENCOUNTER — Encounter: Payer: Self-pay | Admitting: Pulmonary Disease

## 2014-01-09 DIAGNOSIS — G4730 Sleep apnea, unspecified: Secondary | ICD-10-CM

## 2014-01-09 NOTE — Telephone Encounter (Signed)
ATC x 2 NA, wcb

## 2014-01-14 NOTE — Telephone Encounter (Signed)
Results have been explained to patient, pt expressed understanding. Order placed for new start CPAP. 2 month recall entered. Nothing further needed.

## 2014-01-28 ENCOUNTER — Other Ambulatory Visit: Payer: Self-pay | Admitting: Internal Medicine

## 2014-01-29 ENCOUNTER — Telehealth: Payer: Self-pay | Admitting: Pulmonary Disease

## 2014-01-29 DIAGNOSIS — G4733 Obstructive sleep apnea (adult) (pediatric): Secondary | ICD-10-CM

## 2014-01-29 NOTE — Telephone Encounter (Signed)
Please have her auto CPAP changed to range of 5 to 15 cm H2O.  Please advise her to try adjusting the temperature on her CPAP humidifier to get more moisture.  She can also try biotene mouth rinse.  Please have her check with her DME to ensure that her mask is fitting appropriately.

## 2014-01-29 NOTE — Telephone Encounter (Signed)
Called and spoke with pt and she stated that she was having a dry mouth while sleeping before starting the cpap last Friday.  She stated now this is even worse and when she takes the mask off she has to pull her lips off of her teeth and her mouth is very dry.  She is requesting recs for this.  VS please advise thanks.    Allergies  Allergen Reactions  . Amoxicillin Rash  . Clarithromycin Rash  . Doxycycline Rash  . Prednisone Rash  . Sulfonamide Derivatives Rash    Current Outpatient Prescriptions on File Prior to Visit  Medication Sig Dispense Refill  . albuterol (PROVENTIL HFA;VENTOLIN HFA) 108 (90 BASE) MCG/ACT inhaler Inhale 2 puffs into the lungs every 4 (four) hours as needed for wheezing or shortness of breath. 1 Inhaler 6  . ALPRAZolam (XANAX) 0.25 MG tablet Take 1 tablet (0.25 mg total) by mouth daily as needed for anxiety. 30 tablet 3  . amitriptyline (ELAVIL) 10 MG tablet Take 10 mg by mouth at bedtime.    . benzonatate (TESSALON) 200 MG capsule Take 200 mg by mouth 3 (three) times daily as needed for cough.    . dextromethorphan (DELSYM) 30 MG/5ML liquid Take 30 mg by mouth 2 (two) times daily.    . fluticasone (FLONASE) 50 MCG/ACT nasal spray Place 2 sprays into both nostrils daily. 16 g 11  . hydrochlorothiazide (HYDRODIURIL) 25 MG tablet Take 1 tablet (25 mg total) by mouth daily. 30 tablet 6  . LIALDA 1.2 G EC tablet TAKE 4 TABLETS BY MOUTH DAILY WITH BREAKFAST 120 tablet 0  . loratadine (CLARITIN) 10 MG tablet Take 10 mg by mouth daily.    Marland Kitchen omeprazole (PRILOSEC) 20 MG capsule Take 20 mg by mouth 2 (two) times daily.     . potassium chloride SA (K-DUR,KLOR-CON) 20 MEQ tablet Take 1 tablet (20 mEq total) by mouth daily. 30 tablet 3  . Respiratory Therapy Supplies (FLUTTER) DEVI Use as directed 1 each 0  . simvastatin (ZOCOR) 20 MG tablet Take 1 tablet (20 mg total) by mouth at bedtime. 30 tablet 6   No current facility-administered medications on file prior to visit.

## 2014-01-29 NOTE — Telephone Encounter (Signed)
Spoke with pt--aware of rec's per CY. Order placed for pressure change. Nothing further needed.

## 2014-02-19 ENCOUNTER — Encounter: Payer: Self-pay | Admitting: Internal Medicine

## 2014-03-16 ENCOUNTER — Telehealth: Payer: Self-pay | Admitting: Internal Medicine

## 2014-03-16 MED ORDER — MESALAMINE 1.2 G PO TBEC: DELAYED_RELEASE_TABLET | ORAL | Status: DC

## 2014-03-16 NOTE — Telephone Encounter (Signed)
Rx sent 

## 2014-04-10 ENCOUNTER — Encounter: Payer: Self-pay | Admitting: *Deleted

## 2014-04-16 ENCOUNTER — Ambulatory Visit (INDEPENDENT_AMBULATORY_CARE_PROVIDER_SITE_OTHER): Payer: BLUE CROSS/BLUE SHIELD | Admitting: Family Medicine

## 2014-04-16 ENCOUNTER — Encounter: Payer: Self-pay | Admitting: Family Medicine

## 2014-04-16 VITALS — BP 136/86 | HR 82 | Temp 98.2°F | Resp 16 | Wt 230.4 lb

## 2014-04-16 DIAGNOSIS — R350 Frequency of micturition: Secondary | ICD-10-CM

## 2014-04-16 DIAGNOSIS — R310 Gross hematuria: Secondary | ICD-10-CM

## 2014-04-16 LAB — POCT URINALYSIS DIPSTICK
Bilirubin, UA: NEGATIVE
GLUCOSE UA: NEGATIVE
Ketones, UA: NEGATIVE
Leukocytes, UA: NEGATIVE
Nitrite, UA: NEGATIVE
PROTEIN UA: NEGATIVE
RBC UA: NEGATIVE
Spec Grav, UA: 1.015
UROBILINOGEN UA: 2
pH, UA: 6

## 2014-04-16 NOTE — Progress Notes (Signed)
   Subjective:    Patient ID: Kayla Maddox, female    DOB: Nov 20, 1945, 69 y.o.   MRN: [id number]  HPI UTI- pt reports 'feeling a little crazier in my head than normal'.  'seeing a tinge of blood' when urinating.  No dysuria.  Denies increased frequency.  + urgency- 'i always have to do that'.  Pt reports low grade fever- 99.8, 'my normal is 97'.  No CVA tenderness or back pain that is new or different.  Pt reports she has had hematuria previously and had urology work up that was unrevealing.  Denies suprapubic tenderness.   Review of Systems For ROS see HPI     Objective:   Physical Exam  Constitutional: She is oriented to person, place, and time. She appears well-developed and well-nourished. No distress.  HENT:  Head: Normocephalic and atraumatic.  Abdominal: Soft. She exhibits no distension. There is no tenderness (no suprapubic or CVA tenderness). There is no rebound and no guarding.  Neurological: She is alert and oriented to person, place, and time. Coordination normal.  Skin: Skin is warm and dry.  Psychiatric: She has a normal mood and affect. Her behavior is normal. Thought content normal.  Vitals reviewed.         Assessment & Plan:

## 2014-04-16 NOTE — Progress Notes (Signed)
Pre visit review using our clinic review tool, if applicable. No additional management support is needed unless otherwise documented below in the visit note. 

## 2014-04-16 NOTE — Patient Instructions (Signed)
There is no evidence of infection in your urine We will call you with your urology appt for additional evaluation Drink plenty of fluids Call with any questions or concerns Hang in there! Happy Early Rudene Anda!

## 2014-04-16 NOTE — Assessment & Plan Note (Signed)
New to provider, per pt report.  UA in office was unremarkable.  Due to hx of gross hematuria and reported recurrence, will refer back to urology for evaluation and possible tx.  Pt's PE WNL.  No evidence of confusion.  Mood and affect WNL.  Will follow at upcoming physical in 2 weeks.

## 2014-04-28 ENCOUNTER — Encounter: Payer: Self-pay | Admitting: *Deleted

## 2014-04-28 ENCOUNTER — Telehealth: Payer: Self-pay | Admitting: *Deleted

## 2014-04-28 NOTE — Telephone Encounter (Signed)
Pre-Visit Call: Reviewed allergies, medications, health history, and health maintenance with patient and made changes as appropriate.   Preferred pharmacy: [facility] Up to Date!  Pap- 11/10/13 with Dr. Garwin Brothers- normal CCS- 08/13/13 with Zenovia Jarred- adenomatous polyps, Crohn's disease (return 3 yrs) Mmg- 10/28/12- dense tissue- normal BD- 10/19/10- normal Flu- 01/10/14 Td- 10/18/13 Pnm- 01/11/10 Zoster- 03/14/11  Concerns: Patient reports that she is experiencing facial flushing (not a new issue, but an ongoing one)

## 2014-04-29 ENCOUNTER — Ambulatory Visit (INDEPENDENT_AMBULATORY_CARE_PROVIDER_SITE_OTHER): Payer: BLUE CROSS/BLUE SHIELD | Admitting: Family Medicine

## 2014-04-29 ENCOUNTER — Encounter: Payer: Self-pay | Admitting: Family Medicine

## 2014-04-29 VITALS — BP 140/88 | HR 69 | Temp 98.1°F | Resp 16 | Ht 64.5 in | Wt 230.4 lb

## 2014-04-29 DIAGNOSIS — Z0000 Encounter for general adult medical examination without abnormal findings: Secondary | ICD-10-CM

## 2014-04-29 DIAGNOSIS — Z780 Asymptomatic menopausal state: Secondary | ICD-10-CM

## 2014-04-29 DIAGNOSIS — R0789 Other chest pain: Secondary | ICD-10-CM | POA: Insufficient documentation

## 2014-04-29 DIAGNOSIS — R232 Flushing: Secondary | ICD-10-CM

## 2014-04-29 LAB — CBC WITH DIFFERENTIAL/PLATELET
Basophils Absolute: 0 10*3/uL (ref 0.0–0.1)
Basophils Relative: 0.6 % (ref 0.0–3.0)
EOS PCT: 2.1 % (ref 0.0–5.0)
Eosinophils Absolute: 0.2 10*3/uL (ref 0.0–0.7)
HEMATOCRIT: 44 % (ref 36.0–46.0)
HEMOGLOBIN: 14.8 g/dL (ref 12.0–15.0)
LYMPHS PCT: 29.7 % (ref 12.0–46.0)
Lymphs Abs: 2.4 10*3/uL (ref 0.7–4.0)
MCHC: 33.7 g/dL (ref 30.0–36.0)
MCV: 86.2 fl (ref 78.0–100.0)
Monocytes Absolute: 0.6 10*3/uL (ref 0.1–1.0)
Monocytes Relative: 7.1 % (ref 3.0–12.0)
NEUTROS PCT: 60.5 % (ref 43.0–77.0)
Neutro Abs: 4.8 10*3/uL (ref 1.4–7.7)
Platelets: 243 10*3/uL (ref 150.0–400.0)
RBC: 5.11 Mil/uL (ref 3.87–5.11)
RDW: 13.6 % (ref 11.5–15.5)
WBC: 7.9 10*3/uL (ref 4.0–10.5)

## 2014-04-29 LAB — VITAMIN D 25 HYDROXY (VIT D DEFICIENCY, FRACTURES): VITD: 28.71 ng/mL — ABNORMAL LOW (ref 30.00–100.00)

## 2014-04-29 LAB — LIPID PANEL
CHOL/HDL RATIO: 3
Cholesterol: 163 mg/dL (ref 0–200)
HDL: 51 mg/dL (ref >39.00)
LDL CALC: 85 mg/dL (ref 0–99)
NonHDL: 112
TRIGLYCERIDES: 137 mg/dL (ref 0.0–149.0)
VLDL: 27.4 mg/dL (ref 0.0–40.0)

## 2014-04-29 LAB — BASIC METABOLIC PANEL
BUN: 11 mg/dL (ref 6–23)
CO2: 30 meq/L (ref 19–32)
Calcium: 9.3 mg/dL (ref 8.4–10.5)
Chloride: 102 mEq/L (ref 96–112)
Creatinine, Ser: 0.81 mg/dL (ref 0.40–1.20)
GFR: 74.96 mL/min (ref >60.00)
Glucose, Bld: 106 mg/dL — ABNORMAL HIGH (ref 70–99)
POTASSIUM: 3.4 meq/L — AB (ref 3.5–5.1)
SODIUM: 141 meq/L (ref 135–145)

## 2014-04-29 LAB — HEPATIC FUNCTION PANEL
ALK PHOS: 63 U/L (ref 39–117)
ALT: 19 U/L (ref 0–35)
AST: 18 U/L (ref 0–37)
Albumin: 3.9 g/dL (ref 3.5–5.2)
BILIRUBIN TOTAL: 0.7 mg/dL (ref 0.2–1.2)
Bilirubin, Direct: 0.1 mg/dL (ref 0.0–0.3)
TOTAL PROTEIN: 7.1 g/dL (ref 6.0–8.3)

## 2014-04-29 LAB — TSH: TSH: 1.77 u[IU]/mL (ref 0.35–4.50)

## 2014-04-29 MED ORDER — METOPROLOL SUCCINATE ER 25 MG PO TB24: 25.0000 mg | ORAL_TABLET | Freq: Every day | ORAL | Status: DC

## 2014-04-29 NOTE — Progress Notes (Signed)
   Subjective:    Patient ID: Kayla Maddox, female    DOB: 1945/07/24, 69 y.o.   MRN: [id number]  HPI CPE- UTD on colonoscopy, mammo.  No need for pap.  Due for DEXA.  Flushing- pt reports feeling warm and flushing in her cheeks.  Occuring multiple times/day and started over a month ago.  Some dizziness when this occurs, 'not room spinning dizzy'.  Review of Systems Patient reports no vision/ hearing changes, adenopathy,fever, weight change,  persistant/recurrent hoarseness , swallowing issues, palpitations, edema, persistant/recurrent cough, hemoptysis, dyspnea (rest/exertional/paroxysmal nocturnal), gastrointestinal bleeding (melena, rectal bleeding), abdominal pain, significant heartburn, bowel changes, GU symptoms (dysuria, hematuria, incontinence), Gyn symptoms (abnormal  bleeding, pain),  syncope, focal weakness, memory loss, numbness & tingling, skin/hair/nail changes, abnormal bruising or bleeding, anxiety, or depression.   + dry mouth- wearing CPAP nightly, using nasal steroid and saline rinse.  Not on OTC antihistamine. + intermittent chest tightness- occasionally associated w/ anxiety but not always.  No associated SOB.      Objective:   Physical Exam General Appearance:    Alert, cooperative, no distress, appears stated age, obese  Head:    Normocephalic, without obvious abnormality, atraumatic  Eyes:    PERRL, conjunctiva/corneas clear, EOM's intact, fundi    benign, both eyes  Ears:    Normal TM's and external ear canals, both ears  Nose:   Nares normal, septum midline, mucosa normal, no drainage    or sinus tenderness  Throat:   Lips, mucosa, and tongue normal; teeth and gums normal  Neck:   Supple, symmetrical, trachea midline, no adenopathy;    Thyroid: no enlargement/tenderness/nodules  Back:     Symmetric, no curvature, ROM normal, no CVA tenderness  Lungs:     Clear to auscultation bilaterally, respirations unlabored  Chest Wall:    No tenderness or deformity   Heart:     Regular rate and rhythm, S1 and S2 normal, no murmur, rub   or gallop  Breast Exam:    Deferred to mammo  Abdomen:     Soft, non-tender, bowel sounds active all four quadrants,    no masses, no organomegaly  Genitalia:    Deferred  Rectal:    Extremities:   Extremities normal, atraumatic, no cyanosis or edema  Pulses:   2+ and symmetric all extremities  Skin:   Skin color, texture, turgor normal, no rashes or lesions  Lymph nodes:   Cervical, supraclavicular, and axillary nodes normal  Neurologic:   CNII-XII intact, normal strength, sensation and reflexes    throughout          Assessment & Plan:

## 2014-04-29 NOTE — Assessment & Plan Note (Signed)
Pt's PE WNL w/ exception of obesity.  UTD on colonoscopy, mammo, due for DEXA- order entered.  Check labs.  Anticipatory guidance provided.

## 2014-04-29 NOTE — Progress Notes (Signed)
Pre visit review using our clinic review tool, if applicable. No additional management support is needed unless otherwise documented below in the visit note. 

## 2014-04-29 NOTE — Assessment & Plan Note (Signed)
New.  EKG w/o acute abnormality.  Suspect the chest tightness is actually symptomatic PAC.  Check labs to r/o underlying cause- thyroid abnormality, electrolyte disturbance.  Start low dose beta blocker.  If sxs persist, will need card referral.  Reviewed supportive care and red flags that should prompt return.  Pt expressed understanding and is in agreement w/ plan.

## 2014-04-29 NOTE — Patient Instructions (Addendum)
Follow up in 4-6 weeks to recheck flushing We'll notify you of your lab results and make any changes if needed Start the Metoprolol daily to control flushing and your frequent PACs Start a daily OTC Claritin or Zyrtec Drink plenty of fluids to help w/ dry mouth Call with any questions or concerns Happy Belated Birthday!

## 2014-04-29 NOTE — Assessment & Plan Note (Signed)
New.  Pt denies palpitations.  Check labs to r/o underlying metabolic abnormality.  Discussed possibility of anxiety, symptomatic PAC, hot flashes.  Will follow.

## 2014-05-01 ENCOUNTER — Ambulatory Visit: Payer: Self-pay | Admitting: Internal Medicine

## 2014-05-06 ENCOUNTER — Other Ambulatory Visit: Payer: Self-pay | Admitting: General Practice

## 2014-05-06 MED ORDER — HYDROCHLOROTHIAZIDE 25 MG PO TABS: 25.0000 mg | ORAL_TABLET | Freq: Every day | ORAL | Status: DC

## 2014-05-11 ENCOUNTER — Telehealth: Payer: Self-pay | Admitting: Pulmonary Disease

## 2014-05-11 NOTE — Telephone Encounter (Signed)
Called pt's insurance again today and spoke to [name] he informed me no precert was required ,then spoke to pt she is aware and will call her ins herself and find out what the issue is Kayla Maddox

## 2014-05-22 ENCOUNTER — Other Ambulatory Visit: Payer: Self-pay | Admitting: Internal Medicine

## 2014-05-27 ENCOUNTER — Ambulatory Visit: Payer: BLUE CROSS/BLUE SHIELD | Admitting: Family Medicine

## 2014-06-12 ENCOUNTER — Encounter: Payer: Self-pay | Admitting: Family Medicine

## 2014-06-12 ENCOUNTER — Ambulatory Visit (INDEPENDENT_AMBULATORY_CARE_PROVIDER_SITE_OTHER): Payer: BLUE CROSS/BLUE SHIELD | Admitting: Family Medicine

## 2014-06-12 VITALS — BP 130/82 | HR 72 | Temp 97.9°F | Resp 16 | Wt 232.5 lb

## 2014-06-12 DIAGNOSIS — F411 Generalized anxiety disorder: Secondary | ICD-10-CM | POA: Diagnosis not present

## 2014-06-12 DIAGNOSIS — R42 Dizziness and giddiness: Secondary | ICD-10-CM | POA: Diagnosis not present

## 2014-06-12 DIAGNOSIS — R232 Flushing: Secondary | ICD-10-CM

## 2014-06-12 MED ORDER — POTASSIUM CHLORIDE CRYS ER 20 MEQ PO TBCR: 20.0000 meq | EXTENDED_RELEASE_TABLET | Freq: Every day | ORAL | Status: DC

## 2014-06-12 MED ORDER — METOPROLOL SUCCINATE ER 25 MG PO TB24: 25.0000 mg | ORAL_TABLET | Freq: Every day | ORAL | Status: DC

## 2014-06-12 MED ORDER — SIMVASTATIN 20 MG PO TABS: 20.0000 mg | ORAL_TABLET | Freq: Every day | ORAL | Status: DC

## 2014-06-12 MED ORDER — SERTRALINE HCL 25 MG PO TABS: 25.0000 mg | ORAL_TABLET | Freq: Every day | ORAL | Status: DC

## 2014-06-12 MED ORDER — ALPRAZOLAM 0.25 MG PO TABS: 0.2500 mg | ORAL_TABLET | Freq: Every day | ORAL | Status: DC | PRN

## 2014-06-12 NOTE — Progress Notes (Signed)
   Subjective:    Patient ID: Kayla Maddox, female    DOB: 09/15/1945, 69 y.o.   MRN: [id number]  HPI Flushing- pt reports flushing has improved since starting Metoprolol.  'no longer a dark red, more a light pink'.  HTN- chronic problem, Metoprolol was added to HCTZ at last visit.  BP has improved.  HR is excellent at 72.  'spacy episodes'- pt reports some confusion and dizziness.  This is episodic, lasting for 'a few minutes'.  Pt thinks these episodes are worse than when I saw her in Feb.  Pt denies vertigo.  Denies palpitations.  Pt admits to very high stress levels w/ work.   Review of Systems For ROS see HPI     Objective:   Physical Exam  Constitutional: She is oriented to person, place, and time. She appears well-developed and well-nourished. No distress.  HENT:  Head: Normocephalic and atraumatic.  Eyes: Conjunctivae and EOM are normal. Pupils are equal, round, and reactive to light.  Neck: Normal range of motion. Neck supple. No thyromegaly present.  Cardiovascular: Normal rate, regular rhythm, normal heart sounds and intact distal pulses.   No murmur heard. Pulmonary/Chest: Effort normal and breath sounds normal. No respiratory distress.  Abdominal: Soft. She exhibits no distension. There is no tenderness.  Musculoskeletal: She exhibits no edema.  Lymphadenopathy:    She has no cervical adenopathy.  Neurological: She is alert and oriented to person, place, and time.  Skin: Skin is warm and dry.  Psychiatric: She has a normal mood and affect. Her behavior is normal.  Vitals reviewed.         Assessment & Plan:

## 2014-06-12 NOTE — Progress Notes (Signed)
Pre visit review using our clinic review tool, if applicable. No additional management support is needed unless otherwise documented below in the visit note. 

## 2014-06-12 NOTE — Patient Instructions (Signed)
Follow up in 4-6 weeks to recheck dizziness/anxiety Start the Sertraline daily Try and chart your symptoms when you have the episodes- what you experience, how long it lasts, what are the circumstances, etc Continue the metoprolol daily Call with any questions or concerns Hang in there!!  We'll figure this out!

## 2014-06-14 NOTE — Assessment & Plan Note (Signed)
Improved since starting Metoprolol.  Continue current dose.

## 2014-06-14 NOTE — Assessment & Plan Note (Signed)
Deteriorated.  Pt has hx of anxiety and was previously well controlled on xanax but lately this has not been effective.  Pt is having episodes of dizziness and confusion and thinks this may be anxiety related.  Start low dose zoloft.  Continue xanax prn.  Pt expressed understanding and is in agreement w/ plan.

## 2014-06-14 NOTE — Assessment & Plan Note (Signed)
New.  Encouraged pt to drink more water, change positions slowly and chart sxs.  After our discussion, it seems possible that pt's sxs are due to worsening anxiety.  Will follow.

## 2014-06-17 ENCOUNTER — Ambulatory Visit (INDEPENDENT_AMBULATORY_CARE_PROVIDER_SITE_OTHER): Payer: BLUE CROSS/BLUE SHIELD | Admitting: Internal Medicine

## 2014-06-17 ENCOUNTER — Encounter: Payer: Self-pay | Admitting: Internal Medicine

## 2014-06-17 VITALS — BP 122/66 | HR 64 | Ht 64.25 in | Wt 234.5 lb

## 2014-06-17 DIAGNOSIS — K5010 Crohn's disease of large intestine without complications: Secondary | ICD-10-CM

## 2014-06-17 DIAGNOSIS — K219 Gastro-esophageal reflux disease without esophagitis: Secondary | ICD-10-CM

## 2014-06-17 DIAGNOSIS — D126 Benign neoplasm of colon, unspecified: Secondary | ICD-10-CM | POA: Diagnosis not present

## 2014-06-17 MED ORDER — MESALAMINE 1.2 G PO TBEC: 2.4000 g | DELAYED_RELEASE_TABLET | Freq: Every day | ORAL | Status: DC

## 2014-06-17 NOTE — Patient Instructions (Addendum)
Please decrease your Lialda to 2.4 grams (from 4.8 grams) daily.  Please continue your omeprazole.  Please follow up with Dr Hilarie Fredrickson in 9 months.

## 2014-06-17 NOTE — Progress Notes (Signed)
   Subjective:    Patient ID: Kayla Maddox, female    DOB: 08-Jun-1945, 69 y.o.   MRN: [id number]  HPI Kayla Maddox is a 69 year old female with colonic Crohn's, adenomatous colon polyps and GERD who is seen for follow-up. She was last seen in August 2015. She reports she is doing well with Lialda 4.8 g daily. No abdominal pain. Bowel movements are regular occurring 1-3 times daily and are soft, formed and brown. No blood in her stool or melena. Appetite has been good. No trouble swallowing. No heartburn on 20 mg omeprazole twice daily. Cough is improved. No mouth ulcers, new skin rashes, new joint pains or eye complaints.   Review of Systems as per history of present illness, otherwise negative  Current Medications, Allergies, Past Medical History, Past Surgical History, Family History and Social History were reviewed in [employer] record.      Objective:   Physical Exam BP 122/66 mmHg  Pulse 64  Ht 5' 4.25" (1.632 m)  Wt 234 lb 8 oz (106.369 kg)  BMI 39.94 kg/m2 Constitutional: Well-developed and well-nourished. No distress. HEENT: Normocephalic and atraumatic. Oropharynx is clear and moist. No oropharyngeal exudate. Conjunctivae are normal.  No scleral icterus. Cardiovascular: Normal rate, regular rhythm and intact distal pulses. No M/R/G Pulmonary/chest: Effort normal and breath sounds normal. No wheezing, rales or rhonchi. Abdominal: Soft, obese, nontender, nondistended. Bowel sounds active throughout.  Extremities: no clubbing, cyanosis, or edema Neurological: Alert and oriented to person place and time. Skin: Skin is warm and dry. No rashes noted. Psychiatric: Normal mood and affect. Behavior is normal.  CBC    Component Value Date/Time   WBC 7.9 04/29/2014 0956   RBC 5.11 04/29/2014 0956   HGB 14.8 04/29/2014 0956   HCT 44.0 04/29/2014 0956   PLT 243.0 04/29/2014 0956   MCV 86.2 04/29/2014 0956   MCHC 33.7 04/29/2014 0956   RDW 13.6 04/29/2014 0956     LYMPHSABS 2.4 04/29/2014 0956   MONOABS 0.6 04/29/2014 0956   EOSABS 0.2 04/29/2014 0956   BASOSABS 0.0 04/29/2014 0956    CMP     Component Value Date/Time   NA 141 04/29/2014 0956   K 3.4* 04/29/2014 0956   CL 102 04/29/2014 0956   CO2 30 04/29/2014 0956   GLUCOSE 106* 04/29/2014 0956   BUN 11 04/29/2014 0956   CREATININE 0.81 04/29/2014 0956   CALCIUM 9.3 04/29/2014 0956   PROT 7.1 04/29/2014 0956   ALBUMIN 3.9 04/29/2014 0956   AST 18 04/29/2014 0956   ALT 19 04/29/2014 0956   ALKPHOS 63 04/29/2014 0956   BILITOT 0.7 04/29/2014 0956       Assessment & Plan:   69 year old female with colonic Crohn's, adenomatous colon polyps and GERD who is seen for follow-up. She was last seen in August 2015.  1. Crohn's colitis (hep flexure/transverse colon) -- symptomatically improved. We'll decrease Lialda 2.4 g daily and plan to continue this on an ongoing basis. No extraintestinal manifestations currently. Will not escalate therapy at this time. Annual flu vaccine recommended. Follow-up in 9 months for this issue   2. GERD  -- stable continue omeprazole 20 mg twice daily   3. Adenomatous colon polyps  -- repeat colonoscopy recommended 3 years from previous   15 min spent with patient, >50% with counseling for the above issues

## 2014-07-20 ENCOUNTER — Ambulatory Visit (INDEPENDENT_AMBULATORY_CARE_PROVIDER_SITE_OTHER): Payer: BLUE CROSS/BLUE SHIELD | Admitting: Pulmonary Disease

## 2014-07-20 ENCOUNTER — Encounter: Payer: Self-pay | Admitting: Pulmonary Disease

## 2014-07-20 VITALS — BP 102/76 | HR 65 | Ht 65.0 in | Wt 233.0 lb

## 2014-07-20 DIAGNOSIS — G4733 Obstructive sleep apnea (adult) (pediatric): Secondary | ICD-10-CM | POA: Diagnosis not present

## 2014-07-20 NOTE — Progress Notes (Signed)
Chief Complaint  Patient presents with  . Follow-up    Wears CPAP most nights - reports having to take mask off some nights d/t leaking.     History of Present Illness: Kayla Maddox is a 69 y.o. female with OSA.  She is here to review her sleep study.  This showed severe sleep apnea.  She has since been started on CPAP.  She has been doing well with CPAP.  She is sleeping better, and feels her brain works better now.  She has hybrid mask, and has been doing better with this over the past one month.   TESTS: HST 12/31/13 >> AHI 34.4, SaO2 low 69%. Auto CPAP 06/20/14 to 07/19/14 >> used on 29 of 30 nights with average 6 hrs and 53 min.  Average AHI is 1.5 with median CPAP 8 cm H2O and 95 th percentile CPAP 11 cm H20.   Past medical hx >> Anxiety, Rhinitis, HTN, HLD, Crohn's colitis, GERD  Past surgical hx, Medications, Allergies, Family hx, Social hx all reviewed.   Physical Exam: Blood pressure 102/76, pulse 65, height 5' 5"  (1.651 m), weight 233 lb (105.688 kg), SpO2 96 %. Body mass index is 38.77 kg/(m^2).  General - No distress ENT - No sinus tenderness, no oral exudate, no LAN, MP 3, scalloped tongue Cardiac - s1s2 regular, no murmur Chest - No wheeze/rales/dullness Back - No focal tenderness Abd - Soft, non-tender Ext - No edema Neuro - Normal strength Skin - No rashes Psych - normal mood, and behavior   Assessment/Plan:  Obstructive sleep apnea. She is compliant with CPAP and reports benefit from therapy. Plan: - continue auto CPAP  Obesity. Plan: - discussed options to help assist with weight loss   Chesley Mires, MD [facility] Pager:  [phone]

## 2014-07-20 NOTE — Patient Instructions (Signed)
Follow up in 1 year.

## 2014-07-24 ENCOUNTER — Ambulatory Visit (INDEPENDENT_AMBULATORY_CARE_PROVIDER_SITE_OTHER): Payer: BLUE CROSS/BLUE SHIELD | Admitting: Family Medicine

## 2014-07-24 ENCOUNTER — Encounter: Payer: Self-pay | Admitting: Family Medicine

## 2014-07-24 VITALS — BP 110/80 | HR 73 | Temp 98.7°F | Resp 16 | Wt 227.2 lb

## 2014-07-24 DIAGNOSIS — F411 Generalized anxiety disorder: Secondary | ICD-10-CM

## 2014-07-24 NOTE — Progress Notes (Signed)
Pre visit review using our clinic review tool, if applicable. No additional management support is needed unless otherwise documented below in the visit note. 

## 2014-07-24 NOTE — Assessment & Plan Note (Signed)
Much improved.  Pt's dizziness, confusion have resolved.  Pt is feeling much better and is very pleased w/ the result.  No changes at this time.  Will continue to follow.

## 2014-07-24 NOTE — Progress Notes (Signed)
   Subjective:    Patient ID: Kayla Maddox, female    DOB: [birth year], 69 y.o.   MRN: [id number]  HPI Anxiety- pt was started on Zoloft at last visit.  Feels anxiety has improved.  Dizziness has also improved- 'i haven't had any'.  Stress is much better.  No N/V/D.  Sleep is 'pretty good'.  No longer having 'spacey episodes'.   Review of Systems For ROS see HPI      Objective:   Physical Exam  Constitutional: She is oriented to person, place, and time. She appears well-developed and well-nourished. No distress.  HENT:  Head: Normocephalic and atraumatic.  Cardiovascular: Normal rate, regular rhythm, normal heart sounds and intact distal pulses.   Pulmonary/Chest: Effort normal and breath sounds normal. No respiratory distress. She has no wheezes. She has no rales.  Neurological: She is alert and oriented to person, place, and time.  Skin: Skin is warm and dry.  Psychiatric: She has a normal mood and affect. Her behavior is normal. Thought content normal.  Vitals reviewed.         Assessment & Plan:

## 2014-07-24 NOTE — Patient Instructions (Signed)
Follow up in August to recheck BP, cholesterol Continue the Zoloft- you look great! Call with any questions or concerns Have a great weekend!!!

## 2014-08-27 ENCOUNTER — Telehealth: Payer: Self-pay | Admitting: Pulmonary Disease

## 2014-08-27 NOTE — Telephone Encounter (Signed)
Spoke to pt will ck on insurance again tomorrow and flu with pt Joellen Jersey

## 2014-08-27 NOTE — Telephone Encounter (Signed)
atc pt, line busy. Libby please advise.  Thanks!

## 2014-10-26 ENCOUNTER — Other Ambulatory Visit: Payer: Self-pay | Admitting: General Practice

## 2014-10-26 MED ORDER — SERTRALINE HCL 25 MG PO TABS: 25.0000 mg | ORAL_TABLET | Freq: Every day | ORAL | Status: DC

## 2014-10-26 NOTE — Telephone Encounter (Signed)
Medication filled to pharmacy as requested.   

## 2014-10-26 NOTE — Telephone Encounter (Signed)
Last OV 07-24-14 Sertraline last filled 06/12/14 #30 with 3

## 2014-10-30 ENCOUNTER — Ambulatory Visit: Payer: BLUE CROSS/BLUE SHIELD | Admitting: Family Medicine

## 2014-11-13 ENCOUNTER — Ambulatory Visit (INDEPENDENT_AMBULATORY_CARE_PROVIDER_SITE_OTHER): Payer: BLUE CROSS/BLUE SHIELD | Admitting: Family Medicine

## 2014-11-13 ENCOUNTER — Encounter: Payer: Self-pay | Admitting: Family Medicine

## 2014-11-13 VITALS — BP 118/70 | HR 65 | Temp 98.5°F | Resp 18 | Ht 65.0 in | Wt 236.6 lb

## 2014-11-13 DIAGNOSIS — E669 Obesity, unspecified: Secondary | ICD-10-CM

## 2014-11-13 DIAGNOSIS — E785 Hyperlipidemia, unspecified: Secondary | ICD-10-CM | POA: Diagnosis not present

## 2014-11-13 DIAGNOSIS — I10 Essential (primary) hypertension: Secondary | ICD-10-CM

## 2014-11-13 LAB — HEPATIC FUNCTION PANEL
ALK PHOS: 69 U/L (ref 39–117)
ALT: 18 U/L (ref 0–35)
AST: 15 U/L (ref 0–37)
Albumin: 4 g/dL (ref 3.5–5.2)
Bilirubin, Direct: 0 mg/dL (ref 0.0–0.3)
TOTAL PROTEIN: 7.1 g/dL (ref 6.0–8.3)
Total Bilirubin: 0.4 mg/dL (ref 0.2–1.2)

## 2014-11-13 LAB — CBC WITH DIFFERENTIAL/PLATELET
Basophils Absolute: 0 10*3/uL (ref 0.0–0.1)
Basophils Relative: 0.5 % (ref 0.0–3.0)
EOS PCT: 1.8 % (ref 0.0–5.0)
Eosinophils Absolute: 0.2 10*3/uL (ref 0.0–0.7)
HCT: 43.8 % (ref 36.0–46.0)
Hemoglobin: 14.5 g/dL (ref 12.0–15.0)
LYMPHS ABS: 2.6 10*3/uL (ref 0.7–4.0)
Lymphocytes Relative: 29.7 % (ref 12.0–46.0)
MCHC: 33.2 g/dL (ref 30.0–36.0)
MCV: 87.3 fl (ref 78.0–100.0)
MONO ABS: 0.6 10*3/uL (ref 0.1–1.0)
Monocytes Relative: 7 % (ref 3.0–12.0)
NEUTROS PCT: 61 % (ref 43.0–77.0)
Neutro Abs: 5.4 10*3/uL (ref 1.4–7.7)
PLATELETS: 245 10*3/uL (ref 150.0–400.0)
RBC: 5.01 Mil/uL (ref 3.87–5.11)
RDW: 13.5 % (ref 11.5–15.5)
WBC: 8.9 10*3/uL (ref 4.0–10.5)

## 2014-11-13 LAB — LIPID PANEL
Cholesterol: 181 mg/dL (ref 0–200)
HDL: 44.2 mg/dL (ref >39.00)
LDL Cholesterol: 102 mg/dL — ABNORMAL HIGH (ref 0–99)
NonHDL: 136.71
Total CHOL/HDL Ratio: 4
Triglycerides: 174 mg/dL — ABNORMAL HIGH (ref 0.0–149.0)
VLDL: 34.8 mg/dL (ref 0.0–40.0)

## 2014-11-13 LAB — BASIC METABOLIC PANEL
BUN: 11 mg/dL (ref 6–23)
CO2: 35 meq/L — AB (ref 19–32)
Calcium: 9.4 mg/dL (ref 8.4–10.5)
Chloride: 102 mEq/L (ref 96–112)
Creatinine, Ser: 0.8 mg/dL (ref 0.40–1.20)
GFR: 75.92 mL/min (ref >60.00)
GLUCOSE: 90 mg/dL (ref 70–99)
POTASSIUM: 3.8 meq/L (ref 3.5–5.1)
SODIUM: 145 meq/L (ref 135–145)

## 2014-11-13 NOTE — Assessment & Plan Note (Signed)
Ongoing issue for pt.  Discussed w/ pt the need for regular exercise and healthy food choices.  Will continue to follow.

## 2014-11-13 NOTE — Progress Notes (Signed)
Pre visit review using our clinic review tool, if applicable. No additional management support is needed unless otherwise documented below in the visit note. 

## 2014-11-13 NOTE — Assessment & Plan Note (Signed)
Chronic problem.  Excellent control.  Asymptomatic.  Check labs.  No anticipated med changes.  Will continue to follow.

## 2014-11-13 NOTE — Progress Notes (Signed)
   Subjective:    Patient ID: Kayla Maddox, female    DOB: December 10, 1945, 69 y.o.   MRN: [id number]  HPI HTN- chronic problem, on HCTZ and metoprolol w/ good BP control.  No CP, SOB, HAs, visual changes, edema.  Hyperlipidemia- chronic problem, on Simvastatin.  Pt has gained 10 lbs since last visit.  Denies abd pain, N/V, myalgias.  Obesity- pt gained 10 lbs since last visit.  BMI is 39.  No regular exercise- plans to start in July when she retires.   Review of Systems For ROS see HPI     Objective:   Physical Exam  Constitutional: She is oriented to person, place, and time. She appears well-developed and well-nourished. No distress.  obese  HENT:  Head: Normocephalic and atraumatic.  Eyes: Conjunctivae and EOM are normal. Pupils are equal, round, and reactive to light.  Neck: Normal range of motion. Neck supple. No thyromegaly present.  Cardiovascular: Normal rate, regular rhythm, normal heart sounds and intact distal pulses.   No murmur heard. Pulmonary/Chest: Effort normal and breath sounds normal. No respiratory distress.  Abdominal: Soft. She exhibits no distension. There is no tenderness.  Musculoskeletal: She exhibits no edema.  Lymphadenopathy:    She has no cervical adenopathy.  Neurological: She is alert and oriented to person, place, and time.  Skin: Skin is warm and dry.  Psychiatric: She has a normal mood and affect. Her behavior is normal.  Vitals reviewed.         Assessment & Plan:

## 2014-11-13 NOTE — Patient Instructions (Signed)
Schedule your complete physical in 6 months We'll notify you of your lab results and make any changes if needed Try and make healthy food choices and get regular exercise Call with any questions or concerns Happy Labor Day!!!

## 2014-11-13 NOTE — Assessment & Plan Note (Signed)
Chronic problem.  Tolerating statin w/o difficulty.  Check labs.  Adjust meds prn  

## 2014-12-15 ENCOUNTER — Other Ambulatory Visit: Payer: Self-pay | Admitting: General Practice

## 2014-12-15 MED ORDER — HYDROCHLOROTHIAZIDE 25 MG PO TABS: 25.0000 mg | ORAL_TABLET | Freq: Every day | ORAL | Status: DC

## 2014-12-22 ENCOUNTER — Other Ambulatory Visit: Payer: Self-pay | Admitting: General Practice

## 2014-12-22 MED ORDER — FLUTICASONE PROPIONATE 50 MCG/ACT NA SUSP: 2.0000 | Freq: Every day | NASAL | Status: DC

## 2015-02-10 ENCOUNTER — Other Ambulatory Visit: Payer: Self-pay | Admitting: General Practice

## 2015-02-10 MED ORDER — SIMVASTATIN 20 MG PO TABS: 20.0000 mg | ORAL_TABLET | Freq: Every day | ORAL | Status: DC

## 2015-02-12 ENCOUNTER — Other Ambulatory Visit: Payer: Self-pay | Admitting: Internal Medicine

## 2015-02-15 ENCOUNTER — Encounter: Payer: Self-pay | Admitting: Physician Assistant

## 2015-02-15 ENCOUNTER — Ambulatory Visit (INDEPENDENT_AMBULATORY_CARE_PROVIDER_SITE_OTHER): Payer: BLUE CROSS/BLUE SHIELD | Admitting: Physician Assistant

## 2015-02-15 VITALS — BP 143/58 | HR 82 | Temp 98.0°F | Ht 65.0 in | Wt 233.2 lb

## 2015-02-15 DIAGNOSIS — R109 Unspecified abdominal pain: Secondary | ICD-10-CM

## 2015-02-15 DIAGNOSIS — R52 Pain, unspecified: Secondary | ICD-10-CM

## 2015-02-15 DIAGNOSIS — R6889 Other general symptoms and signs: Secondary | ICD-10-CM

## 2015-02-15 MED ORDER — DICYCLOMINE HCL 10 MG PO CAPS: 10.0000 mg | ORAL_CAPSULE | Freq: Three times a day (TID) | ORAL | Status: DC

## 2015-02-15 NOTE — Progress Notes (Signed)
Patient presents to clinic today c/o 2 weeks of abdominal pressure, cramping and bloating over the past couple of weeks. Patient denies increase gaseousness. Denies change to diet. Denies melena or hematochezia. Denies fever, chills, or heart burn. Patient denies diarrhea. Patient also endorses intermittent hot sensation in her head occurring multiple times per day. Denies nasal congestion. Endorses chronic cough over the past several years having seen multiple ENT and pulmonary specialists. Patient with history of GERD and Crohn colitis.    Past Medical History  Diagnosis Date  . Anxiety   . Allergic rhinitis   . Hypertension   . Hyperlipidemia   . Obesity   . Sleep apnea   . Diverticulosis   . Hyperplastic colon polyp   . Tubular adenoma of colon   . Chronic colitis   . Gastric heterotopia   . Fundic gland polyps of stomach, benign   . GERD (gastroesophageal reflux disease)   . Crohn's colitis [facility])     Current Outpatient Prescriptions on File Prior to Visit  Medication Sig Dispense Refill  . ALPRAZolam (XANAX) 0.25 MG tablet Take 1 tablet (0.25 mg total) by mouth daily as needed for anxiety. 30 tablet 3  . dextromethorphan (DELSYM) 30 MG/5ML liquid Take by mouth as needed for cough.    . fluticasone (FLONASE) 50 MCG/ACT nasal spray Place 2 sprays into both nostrils daily. 16 g 11  . hydrochlorothiazide (HYDRODIURIL) 25 MG tablet Take 1 tablet (25 mg total) by mouth daily. 30 tablet 6  . LIALDA 1.2 G EC tablet TAKE TWO TABLETS EVERY DAY WITH BREAKFAST 60 tablet 0  . loratadine (CLARITIN) 10 MG tablet Take 10 mg by mouth daily.    . metoprolol succinate (TOPROL-XL) 25 MG 24 hr tablet Take 1 tablet (25 mg total) by mouth daily. 30 tablet 6  . omeprazole (PRILOSEC) 20 MG capsule Take 20 mg by mouth 2 (two) times daily.     . potassium chloride SA (K-DUR,KLOR-CON) 20 MEQ tablet Take 1 tablet (20 mEq total) by mouth daily. 30 tablet 6  . Respiratory Therapy Supplies (FLUTTER) DEVI Use  as directed 1 each 0  . sertraline (ZOLOFT) 25 MG tablet Take 1 tablet (25 mg total) by mouth daily. 30 tablet 3  . simvastatin (ZOCOR) 20 MG tablet Take 1 tablet (20 mg total) by mouth at bedtime. 30 tablet 6   No current facility-administered medications on file prior to visit.    Allergies  Allergen Reactions  . Amoxicillin Rash  . Clarithromycin Rash  . Doxycycline Rash  . Prednisone Rash  . Sulfonamide Derivatives Rash    Family History  Problem Relation Age of Onset  . Diabetes Father   . Hypertension Father   . Heart disease Father   . Hypertension Mother   . Heart disease Mother   . Colon cancer Neg Hx   . Esophageal cancer Neg Hx   . Rectal cancer Neg Hx   . Stomach cancer Neg Hx     Social History   Social History  . Marital Status: Married    Spouse Name: N/A  . Number of Children: 1  . Years of Education: N/A   Occupational History  . office clerk    Social History Main Topics  . Smoking status: Former Smoker -- 1.00 packs/day for 20 years    Types: Cigarettes    Quit date: 03/13/1981  . Smokeless tobacco: Never Used  . Alcohol Use: No  . Drug Use: No  . Sexual Activity:  Not Asked   Other Topics Concern  . None   Social History Narrative   Review of Systems - See HPI.  All other ROS are negative.  BP 143/58 mmHg  Pulse 82  Temp(Src) 98 F (36.7 C) (Oral)  Ht 5' 5"  (1.651 m)  Wt 233 lb 3.2 oz (105.779 kg)  BMI 38.81 kg/m2  SpO2 97%  Physical Exam  Constitutional: She is oriented to person, place, and time and well-developed, well-nourished, and in no distress.  HENT:  Head: Normocephalic and atraumatic.  Right Ear: External ear normal.  Left Ear: External ear normal.  Nose: Nose normal.  Mouth/Throat: Oropharynx is clear and moist. No oropharyngeal exudate.  TM within normal limits bilaterally.  Eyes: Conjunctivae are normal.  Cardiovascular: Normal rate, regular rhythm, normal heart sounds and intact distal pulses.     Pulmonary/Chest: Effort normal and breath sounds normal. No respiratory distress. She has no wheezes. She has no rales. She exhibits no tenderness.  Abdominal: Normal appearance. Bowel sounds are hyperactive. There is generalized tenderness. No hernia.  Neurological: She is alert and oriented to person, place, and time.  Skin: Skin is warm and dry. No rash noted.  Vitals reviewed.   No results found for this or any previous visit (from the past 2160 hour(s)).  Assessment/Plan: Abdominal cramping Unclear etiology. Potentially hypersensitive bowel versus flare of Crohn's. GERD controlled per patient. Will obtain lab work up. Begin probiotic. Rx Bentyl for cramping. Will alter regimen based on results.   Sensation of feeling hot No signs of sinus infection. Will check lab panel today to further assess. Supportive measures reviewed.

## 2015-02-15 NOTE — Progress Notes (Signed)
Pre visit review using our clinic review tool, if applicable. No additional management support is needed unless otherwise documented below in the visit note. 

## 2015-02-15 NOTE — Patient Instructions (Signed)
Please go to the lab for blood work. I will call with your results. Take the Bentyl as directed for abdominal cramping. Increase fluids and begin a probiotic. Continue chronic medications as directed. I will call you once your labs have resulted.

## 2015-02-16 DIAGNOSIS — R6889 Other general symptoms and signs: Secondary | ICD-10-CM | POA: Insufficient documentation

## 2015-02-16 DIAGNOSIS — R109 Unspecified abdominal pain: Secondary | ICD-10-CM | POA: Insufficient documentation

## 2015-02-16 LAB — CBC WITH DIFFERENTIAL/PLATELET
BASOS ABS: 0.1 10*3/uL (ref 0.0–0.1)
Basophils Relative: 1.3 % (ref 0.0–3.0)
EOS ABS: 0.2 10*3/uL (ref 0.0–0.7)
Eosinophils Relative: 2.9 % (ref 0.0–5.0)
HEMATOCRIT: 45 % (ref 36.0–46.0)
Hemoglobin: 14.8 g/dL (ref 12.0–15.0)
LYMPHS PCT: 31.1 % (ref 12.0–46.0)
Lymphs Abs: 2.6 10*3/uL (ref 0.7–4.0)
MCHC: 32.9 g/dL (ref 30.0–36.0)
MCV: 86.5 fl (ref 78.0–100.0)
Monocytes Absolute: 0.6 10*3/uL (ref 0.1–1.0)
Monocytes Relative: 7.4 % (ref 3.0–12.0)
NEUTROS ABS: 4.8 10*3/uL (ref 1.4–7.7)
NEUTROS PCT: 57.3 % (ref 43.0–77.0)
Platelets: 280 10*3/uL (ref 150.0–400.0)
RBC: 5.2 Mil/uL — ABNORMAL HIGH (ref 3.87–5.11)
RDW: 13.5 % (ref 11.5–15.5)
WBC: 8.3 10*3/uL (ref 4.0–10.5)

## 2015-02-16 LAB — H. PYLORI ANTIBODY, IGG: H Pylori IgG: NEGATIVE

## 2015-02-16 LAB — COMPREHENSIVE METABOLIC PANEL
ALBUMIN: 3.9 g/dL (ref 3.5–5.2)
ALK PHOS: 61 U/L (ref 39–117)
ALT: 24 U/L (ref 0–35)
AST: 22 U/L (ref 0–37)
BILIRUBIN TOTAL: 0.5 mg/dL (ref 0.2–1.2)
BUN: 12 mg/dL (ref 6–23)
CALCIUM: 9.6 mg/dL (ref 8.4–10.5)
CO2: 33 mEq/L — ABNORMAL HIGH (ref 19–32)
Chloride: 101 mEq/L (ref 96–112)
Creatinine, Ser: 0.8 mg/dL (ref 0.40–1.20)
GFR: 75.86 mL/min (ref >60.00)
GLUCOSE: 91 mg/dL (ref 70–99)
Potassium: 3.9 mEq/L (ref 3.5–5.1)
Sodium: 142 mEq/L (ref 135–145)
TOTAL PROTEIN: 7.1 g/dL (ref 6.0–8.3)

## 2015-02-16 LAB — TSH: TSH: 2.27 u[IU]/mL (ref 0.35–4.50)

## 2015-02-16 LAB — LIPASE: LIPASE: 16 U/L (ref 11.0–59.0)

## 2015-02-16 NOTE — Assessment & Plan Note (Signed)
No signs of sinus infection. Will check lab panel today to further assess. Supportive measures reviewed.

## 2015-02-16 NOTE — Assessment & Plan Note (Signed)
Unclear etiology. Potentially hypersensitive bowel versus flare of Crohn's. GERD controlled per patient. Will obtain lab work up. Begin probiotic. Rx Bentyl for cramping. Will alter regimen based on results.

## 2015-02-17 ENCOUNTER — Telehealth: Payer: Self-pay | Admitting: Family Medicine

## 2015-02-17 NOTE — Telephone Encounter (Signed)
Spoke with the pt and she's aware of recent lab results.//AB/CMA

## 2015-02-17 NOTE — Telephone Encounter (Signed)
Pt says that she is returning your call in regards to lab results.    CB: [phone]

## 2015-03-14 DIAGNOSIS — K824 Cholesterolosis of gallbladder: Secondary | ICD-10-CM

## 2015-03-14 HISTORY — DX: Cholesterolosis of gallbladder: K82.4

## 2015-03-14 HISTORY — PX: OTHER SURGICAL HISTORY: SHX169

## 2015-04-06 ENCOUNTER — Other Ambulatory Visit (INDEPENDENT_AMBULATORY_CARE_PROVIDER_SITE_OTHER): Payer: BLUE CROSS/BLUE SHIELD

## 2015-04-06 ENCOUNTER — Ambulatory Visit (INDEPENDENT_AMBULATORY_CARE_PROVIDER_SITE_OTHER): Payer: BLUE CROSS/BLUE SHIELD | Admitting: Internal Medicine

## 2015-04-06 ENCOUNTER — Encounter: Payer: Self-pay | Admitting: Internal Medicine

## 2015-04-06 VITALS — BP 138/84 | HR 71 | Ht 65.0 in | Wt 229.6 lb

## 2015-04-06 DIAGNOSIS — K50119 Crohn's disease of large intestine with unspecified complications: Secondary | ICD-10-CM

## 2015-04-06 DIAGNOSIS — R140 Abdominal distension (gaseous): Secondary | ICD-10-CM | POA: Diagnosis not present

## 2015-04-06 DIAGNOSIS — K219 Gastro-esophageal reflux disease without esophagitis: Secondary | ICD-10-CM

## 2015-04-06 DIAGNOSIS — R109 Unspecified abdominal pain: Secondary | ICD-10-CM

## 2015-04-06 LAB — IGA: IgA: 199 mg/dL (ref 68–378)

## 2015-04-06 LAB — HIGH SENSITIVITY CRP: CRP, High Sensitivity: 3.47 mg/L (ref 0.000–5.000)

## 2015-04-06 MED ORDER — MESALAMINE 1.2 G PO TBEC: DELAYED_RELEASE_TABLET | ORAL | Status: DC

## 2015-04-06 MED ORDER — OMEPRAZOLE 40 MG PO CPDR: 40.0000 mg | DELAYED_RELEASE_CAPSULE | Freq: Every day | ORAL | Status: DC

## 2015-04-06 NOTE — Progress Notes (Signed)
Subjective:    Patient ID: Kayla Maddox, female    DOB: 1945-05-23, 70 y.o.   MRN: [id number]  HPI Kayla Maddox is a 70 year old female with history of colonic Crohn's, adenomatous colon polyps, GERD and gastritis who is seen for follow-up. She was last seen in April 2016. She reports for 2 months she's been having issues with upper abdominal pressure and bloating. This is worse after supper but is present all day. Not associated with nausea or vomiting. No heartburn. She was previously on PPI but is no longer. No dysphagia or odynophagia. She is eating less but reports she can eat a complete meal without early satiety. Bowel movements of been regular and she denies blood in her stool or melena. She saw primary care had a lab evaluation which was unremarkable and probiotic was recommended. She has not found much benefit with probiotic.  Her last upper endoscopy was 01/01/2013 which showed fundic gland polyps in the proximal stomach, nodular gastritis which was biopsied and mild duodenitis. Gastric biopsies showed chronic focally active gastritis without H. pylori or metaplasia. Colonoscopy performed last in June 2015. This showed segmental colitis from the hepatic flexure to the mid transverse colon. 2 polyps were removed from the ascending colon, 5 polyps removed from the distal transverse, descending and sigmoid colon. 6 polyps removed from the rectosigmoid colon. Pathology revealed sessile serrated polyps and hyperplastic polyps from the distal transverse, sigmoid and descending colon. Tubular adenoma and sessile serrated polyp in the ascending colon. Hyperplastic polyps in the rectosigmoid colon. An chronic active colitis at the hepatic flexure. She has been maintained on Lialda since the colonoscopy initially at 4.8 g daily but more recently 2.4 g daily  Review of Systems As per history of present illness, otherwise negative  Current Medications, Allergies, Past Medical History, Past Surgical  History, Family History and Social History were reviewed in [employer] record.     Objective:   Physical Exam BP 138/84 mmHg  Pulse 71  Ht 5' 5"  (1.651 m)  Wt 229 lb 9.6 oz (104.146 kg)  BMI 38.21 kg/m2 Constitutional: Well-developed and well-nourished. No distress. HEENT: Normocephalic and atraumatic. Oropharynx is clear and moist. No oropharyngeal exudate. Conjunctivae are normal.  No scleral icterus. Neck: Neck supple. Trachea midline. Cardiovascular: Normal rate, regular rhythm and intact distal pulses. No M/R/G Pulmonary/chest: Effort normal and breath sounds normal. No wheezing, rales or rhonchi. Abdominal: Soft, mild upper abdominal tenderness without rebound or guarding, obese, nondistended. Bowel sounds active throughout.  Extremities: no clubbing, cyanosis, or edema Neurological: Alert and oriented to person place and time. Skin: Skin is warm and dry.  Psychiatric: Normal mood and affect. Behavior is normal.  CBC    Component Value Date/Time   WBC 8.3 02/15/2015 1632   RBC 5.20* 02/15/2015 1632   HGB 14.8 02/15/2015 1632   HCT 45.0 02/15/2015 1632   PLT 280.0 02/15/2015 1632   MCV 86.5 02/15/2015 1632   MCHC 32.9 02/15/2015 1632   RDW 13.5 02/15/2015 1632   LYMPHSABS 2.6 02/15/2015 1632   MONOABS 0.6 02/15/2015 1632   EOSABS 0.2 02/15/2015 1632   BASOSABS 0.1 02/15/2015 1632    CMP     Component Value Date/Time   NA 142 02/15/2015 1632   K 3.9 02/15/2015 1632   CL 101 02/15/2015 1632   CO2 33* 02/15/2015 1632   GLUCOSE 91 02/15/2015 1632   BUN 12 02/15/2015 1632   CREATININE 0.80 02/15/2015 1632   CALCIUM 9.6 02/15/2015 1632  PROT 7.1 02/15/2015 1632   ALBUMIN 3.9 02/15/2015 1632   AST 22 02/15/2015 1632   ALT 24 02/15/2015 1632   ALKPHOS 61 02/15/2015 1632   BILITOT 0.5 02/15/2015 1632       Assessment & Plan:  70 year old female with history of colonic Crohn's, adenomatous colon polyps, GERD and gastritis who is seen for  follow-up.   1. Upper abdominal bloating/pressure -- no benefit from probiotic and in some cases probiotic can worsen bloating. Discontinue probiotic. Trial of omeprazole 40 mg daily given history of gastritis and current symptoms. Call in 2-3 weeks for update on symptoms. If not better consider abdominal ultrasound and also increasing Lialda for possible colitis flare given the presence of her disease high in the abdomen (in the transverse colon and at hepatic flexure).  For now continue Lialda 2.4 g daily. Check celiac panel and CRP today. Bacterial overgrowth also a consideration  2. Segmental colitis -- as above. Continue Lialda 2.4 g daily.  25 minutes spent with the patient today. Greater than 50% was spent in counseling and coordination of care with the patient

## 2015-04-06 NOTE — Patient Instructions (Signed)
Your physician has requested that you go to the basement for the following lab work before leaving today: IgA, TtG, CRP  We have sent the following medications to your pharmacy for you to pick up at your convenience: Omeprazole 40 mg daily Lialda 2 tablets daily  Please stop your probiotic.  Call our office in 2-3 weeks with an update on how you are doing. Our phone number is [phone].  Follow up with Dr Hilarie Fredrickson in 3-6 months.

## 2015-04-08 LAB — TISSUE TRANSGLUTAMINASE, IGA: Tissue Transglutaminase Ab, IgA: 1 U/mL (ref <4)

## 2015-04-20 ENCOUNTER — Other Ambulatory Visit: Payer: Self-pay

## 2015-04-20 ENCOUNTER — Encounter: Payer: Self-pay | Admitting: Internal Medicine

## 2015-04-20 DIAGNOSIS — R1011 Right upper quadrant pain: Secondary | ICD-10-CM

## 2015-04-20 MED ORDER — MESALAMINE 1.2 G PO TBEC: 4.8000 g | DELAYED_RELEASE_TABLET | Freq: Every day | ORAL | Status: DC

## 2015-04-20 NOTE — Telephone Encounter (Signed)
Okay recommend abdominal ultrasound for upper and right upper abdominal pain Increase Lialda 4.8 g daily Call in 2-3 weeks if not improving Await ultrasound results

## 2015-04-26 ENCOUNTER — Ambulatory Visit (HOSPITAL_COMMUNITY)
Admission: RE | Admit: 2015-04-26 | Discharge: 2015-04-26 | Disposition: A | Discharge status: Home / Self Care | Payer: BLUE CROSS/BLUE SHIELD | Source: Ambulatory Visit | Attending: Internal Medicine | Admitting: Internal Medicine

## 2015-04-26 DIAGNOSIS — K760 Fatty (change of) liver, not elsewhere classified: Secondary | ICD-10-CM | POA: Insufficient documentation

## 2015-04-26 DIAGNOSIS — K824 Cholesterolosis of gallbladder: Secondary | ICD-10-CM | POA: Diagnosis not present

## 2015-04-26 DIAGNOSIS — R1011 Right upper quadrant pain: Secondary | ICD-10-CM | POA: Diagnosis present

## 2015-05-04 ENCOUNTER — Ambulatory Visit (INDEPENDENT_AMBULATORY_CARE_PROVIDER_SITE_OTHER): Payer: BLUE CROSS/BLUE SHIELD | Admitting: Internal Medicine

## 2015-05-04 ENCOUNTER — Encounter: Payer: Self-pay | Admitting: Internal Medicine

## 2015-05-04 ENCOUNTER — Other Ambulatory Visit (INDEPENDENT_AMBULATORY_CARE_PROVIDER_SITE_OTHER): Payer: BLUE CROSS/BLUE SHIELD

## 2015-05-04 VITALS — BP 138/82 | HR 84 | Temp 97.8°F | Resp 16 | Wt 226.0 lb

## 2015-05-04 DIAGNOSIS — Z23 Encounter for immunization: Secondary | ICD-10-CM | POA: Diagnosis not present

## 2015-05-04 DIAGNOSIS — K760 Fatty (change of) liver, not elsewhere classified: Secondary | ICD-10-CM

## 2015-05-04 DIAGNOSIS — J4522 Mild intermittent asthma with status asthmaticus: Secondary | ICD-10-CM

## 2015-05-04 DIAGNOSIS — R0602 Shortness of breath: Secondary | ICD-10-CM

## 2015-05-04 DIAGNOSIS — I10 Essential (primary) hypertension: Secondary | ICD-10-CM

## 2015-05-04 DIAGNOSIS — K219 Gastro-esophageal reflux disease without esophagitis: Secondary | ICD-10-CM

## 2015-05-04 DIAGNOSIS — Z0000 Encounter for general adult medical examination without abnormal findings: Secondary | ICD-10-CM | POA: Diagnosis not present

## 2015-05-04 DIAGNOSIS — G4733 Obstructive sleep apnea (adult) (pediatric): Secondary | ICD-10-CM

## 2015-05-04 LAB — HEMOGLOBIN A1C: Hgb A1c MFr Bld: 6.1 % (ref 4.6–6.5)

## 2015-05-04 LAB — CBC WITH DIFFERENTIAL/PLATELET
BASOS PCT: 1.3 % (ref 0.0–3.0)
Basophils Absolute: 0.1 10*3/uL (ref 0.0–0.1)
EOS ABS: 0.1 10*3/uL (ref 0.0–0.7)
Eosinophils Relative: 1.8 % (ref 0.0–5.0)
HCT: 46.5 % — ABNORMAL HIGH (ref 36.0–46.0)
Hemoglobin: 15.6 g/dL — ABNORMAL HIGH (ref 12.0–15.0)
Lymphocytes Relative: 28.4 % (ref 12.0–46.0)
Lymphs Abs: 2.1 10*3/uL (ref 0.7–4.0)
MCHC: 33.5 g/dL (ref 30.0–36.0)
MCV: 85.7 fl (ref 78.0–100.0)
MONO ABS: 0.5 10*3/uL (ref 0.1–1.0)
Monocytes Relative: 6.2 % (ref 3.0–12.0)
NEUTROS ABS: 4.7 10*3/uL (ref 1.4–7.7)
NEUTROS PCT: 62.3 % (ref 43.0–77.0)
PLATELETS: 260 10*3/uL (ref 150.0–400.0)
RBC: 5.43 Mil/uL — ABNORMAL HIGH (ref 3.87–5.11)
RDW: 13.7 % (ref 11.5–15.5)
WBC: 7.6 10*3/uL (ref 4.0–10.5)

## 2015-05-04 LAB — LIPID PANEL
CHOLESTEROL: 223 mg/dL — AB (ref 0–200)
HDL: 49.3 mg/dL (ref >39.00)
LDL CALC: 134 mg/dL — AB (ref 0–99)
NONHDL: 173.4
Total CHOL/HDL Ratio: 5
Triglycerides: 197 mg/dL — ABNORMAL HIGH (ref 0.0–149.0)
VLDL: 39.4 mg/dL (ref 0.0–40.0)

## 2015-05-04 LAB — COMPREHENSIVE METABOLIC PANEL
ALT: 18 U/L (ref 0–35)
AST: 18 U/L (ref 0–37)
Albumin: 4.1 g/dL (ref 3.5–5.2)
Alkaline Phosphatase: 61 U/L (ref 39–117)
BUN: 10 mg/dL (ref 6–23)
CHLORIDE: 100 meq/L (ref 96–112)
CO2: 34 meq/L — AB (ref 19–32)
CREATININE: 0.79 mg/dL (ref 0.40–1.20)
Calcium: 9.4 mg/dL (ref 8.4–10.5)
GFR: 76.92 mL/min (ref >60.00)
GLUCOSE: 111 mg/dL — AB (ref 70–99)
Potassium: 3.9 mEq/L (ref 3.5–5.1)
SODIUM: 141 meq/L (ref 135–145)
Total Bilirubin: 0.7 mg/dL (ref 0.2–1.2)
Total Protein: 7.2 g/dL (ref 6.0–8.3)

## 2015-05-04 LAB — TSH: TSH: 1.86 u[IU]/mL (ref 0.35–4.50)

## 2015-05-04 NOTE — Patient Instructions (Addendum)
[name] , Thank you for taking time to come for your Medicare Wellness Visit. I appreciate your ongoing commitment to your health goals. Please review the following plan we discussed and let me know if I can assist you in the future.   These are the goals we discussed: Goals    Work on increasing exercise and weight loss      This is a list of the screening recommended for you and due dates:  Health Maintenance  Topic Date Due  .  Hepatitis C: One time screening is recommended by Center for Disease Control  (CDC) for  adults born from [birth year] through 1963.   [birth year]  --  ordered today  . Mammogram  12/11/2014    -- up to date  Done 02/2015  . Pneumonia vaccines (2 of 2 - PPSV23) 01/12/2015   -- given today  . Flu Shot  11/13/2015*  . Colon Cancer Screening  08/13/2016  . Tetanus Vaccine  10/29/2023  . DEXA scan (bone density measurement)  Completed  . Shingles Vaccine  Completed  *Topic was postponed. The date shown is not the original due date.     We have reviewed your prior records including labs and tests today.  Test(s) ordered today. Your results will be released to [place] (or called to you) after review, usually within 72hours after test completion. If any changes need to be made, you will be notified at that same time.  All other Health Maintenance issues reviewed.   All recommended immunizations and age-appropriate screenings are up-to-date.  Pneumonia vaccine administered today.   Medications reviewed and updated.  No changes recommended at this time.   Please followup annually, sooner if needed   Health Maintenance, Female Adopting a healthy lifestyle and getting preventive care can go a long way to promote health and wellness. Talk with your health care provider about what schedule of regular examinations is right for you. This is a good chance for you to check in with your provider about disease prevention and staying healthy. In between checkups, there are  plenty of things you can do on your own. Experts have done a lot of research about which lifestyle changes and preventive measures are most likely to keep you healthy. Ask your health care provider for more information. WEIGHT AND DIET  Eat a healthy diet  Be sure to include plenty of vegetables, fruits, low-fat dairy products, and lean protein.  Do not eat a lot of foods high in solid fats, added sugars, or salt.  Get regular exercise. This is one of the most important things you can do for your health.  Most adults should exercise for at least 150 minutes each week. The exercise should increase your heart rate and make you sweat (moderate-intensity exercise).  Most adults should also do strengthening exercises at least twice a week. This is in addition to the moderate-intensity exercise.  Maintain a healthy weight  Body mass index (BMI) is a measurement that can be used to identify possible weight problems. It estimates body fat based on height and weight. Your health care provider can help determine your BMI and help you achieve or maintain a healthy weight.  For females 85 years of age and older:   A BMI below 18.5 is considered underweight.  A BMI of 18.5 to 24.9 is normal.  A BMI of 25 to 29.9 is considered overweight.  A BMI of 30 and above is considered obese.  Watch levels of cholesterol and  blood lipids  You should start having your blood tested for lipids and cholesterol at 68 years of age, then have this test every 5 years.  You may need to have your cholesterol levels checked more often if:  Your lipid or cholesterol levels are high.  You are older than 68 years of age.  You are at high risk for heart disease.  CANCER SCREENING   Lung Cancer  Lung cancer screening is recommended for adults 88-9 years old who are at high risk for lung cancer because of a history of smoking.  A yearly low-dose CT scan of the lungs is recommended for people who:  Currently  smoke.  Have quit within the past 15 years.  Have at least a 30-pack-year history of smoking. A pack year is smoking an average of one pack of cigarettes a day for 1 year.  Yearly screening should continue until it has been 15 years since you quit.  Yearly screening should stop if you develop a health problem that would prevent you from having lung cancer treatment.  Breast Cancer  Practice breast self-awareness. This means understanding how your breasts normally appear and feel.  It also means doing regular breast self-exams. Let your health care provider know about any changes, no matter how small.  If you are in your 20s or 30s, you should have a clinical breast exam (CBE) by a health care provider every 1-3 years as part of a regular health exam.  If you are 64 or older, have a CBE every year. Also consider having a breast X-ray (mammogram) every year.  If you have a family history of breast cancer, talk to your health care provider about genetic screening.  If you are at high risk for breast cancer, talk to your health care provider about having an MRI and a mammogram every year.  Breast cancer gene (BRCA) assessment is recommended for women who have family members with BRCA-related cancers. BRCA-related cancers include:  Breast.  Ovarian.  Tubal.  Peritoneal cancers.  Results of the assessment will determine the need for genetic counseling and BRCA1 and BRCA2 testing. Cervical Cancer Your health care provider may recommend that you be screened regularly for cancer of the pelvic organs (ovaries, uterus, and vagina). This screening involves a pelvic examination, including checking for microscopic changes to the surface of your cervix (Pap test). You may be encouraged to have this screening done every 3 years, beginning at age 46.  For women ages 20-65, health care providers may recommend pelvic exams and Pap testing every 3 years, or they may recommend the Pap and pelvic  exam, combined with testing for human papilloma virus (HPV), every 5 years. Some types of HPV increase your risk of cervical cancer. Testing for HPV may also be done on women of any age with unclear Pap test results.  Other health care providers may not recommend any screening for nonpregnant women who are considered low risk for pelvic cancer and who do not have symptoms. Ask your health care provider if a screening pelvic exam is right for you.  If you have had past treatment for cervical cancer or a condition that could lead to cancer, you need Pap tests and screening for cancer for at least 20 years after your treatment. If Pap tests have been discontinued, your risk factors (such as having a new sexual partner) need to be reassessed to determine if screening should resume. Some women have medical problems that increase the chance of getting  cervical cancer. In these cases, your health care provider may recommend more frequent screening and Pap tests. Colorectal Cancer  This type of cancer can be detected and often prevented.  Routine colorectal cancer screening usually begins at 68 years of age and continues through 68 years of age.  Your health care provider may recommend screening at an earlier age if you have risk factors for colon cancer.  Your health care provider may also recommend using home test kits to check for hidden blood in the stool.  A small camera at the end of a tube can be used to examine your colon directly (sigmoidoscopy or colonoscopy). This is done to check for the earliest forms of colorectal cancer.  Routine screening usually begins at age 65.  Direct examination of the colon should be repeated every 5-10 years through 68 years of age. However, you may need to be screened more often if early forms of precancerous polyps or small growths are found. Skin Cancer  Check your skin from head to toe regularly.  Tell your health care provider about any new moles or  changes in moles, especially if there is a change in a mole's shape or color.  Also tell your health care provider if you have a mole that is larger than the size of a pencil eraser.  Always use sunscreen. Apply sunscreen liberally and repeatedly throughout the day.  Protect yourself by wearing long sleeves, pants, a wide-brimmed hat, and sunglasses whenever you are outside. HEART DISEASE, DIABETES, AND HIGH BLOOD PRESSURE   High blood pressure causes heart disease and increases the risk of stroke. High blood pressure is more likely to develop in:  People who have blood pressure in the high end of the normal range (130-139/85-89 mm Hg).  People who are overweight or obese.  People who are African American.  If you are 37-38 years of age, have your blood pressure checked every 3-5 years. If you are 25 years of age or older, have your blood pressure checked every year. You should have your blood pressure measured twice--once when you are at a hospital or clinic, and once when you are not at a hospital or clinic. Record the average of the two measurements. To check your blood pressure when you are not at a hospital or clinic, you can use:  An automated blood pressure machine at a pharmacy.  A home blood pressure monitor.  If you are between 35 years and 55 years old, ask your health care provider if you should take aspirin to prevent strokes.  Have regular diabetes screenings. This involves taking a blood sample to check your fasting blood sugar level.  If you are at a normal weight and have a low risk for diabetes, have this test once every three years after 68 years of age.  If you are overweight and have a high risk for diabetes, consider being tested at a younger age or more often. PREVENTING INFECTION  Hepatitis B  If you have a higher risk for hepatitis B, you should be screened for this virus. You are considered at high risk for hepatitis B if:  You were born in a country where  hepatitis B is common. Ask your health care provider which countries are considered high risk.  Your parents were born in a high-risk country, and you have not been immunized against hepatitis B (hepatitis B vaccine).  You have HIV or AIDS.  You use needles to inject street drugs.  You live with  someone who has hepatitis B.  You have had sex with someone who has hepatitis B.  You get hemodialysis treatment.  You take certain medicines for conditions, including cancer, organ transplantation, and autoimmune conditions. Hepatitis C  Blood testing is recommended for:  Everyone born from [birth year] through 1963.  Anyone with known risk factors for hepatitis C. Sexually transmitted infections (STIs)  You should be screened for sexually transmitted infections (STIs) including gonorrhea and chlamydia if:  You are sexually active and are younger than 68 years of age.  You are older than 68 years of age and your health care provider tells you that you are at risk for this type of infection.  Your sexual activity has changed since you were last screened and you are at an increased risk for chlamydia or gonorrhea. Ask your health care provider if you are at risk.  If you do not have HIV, but are at risk, it may be recommended that you take a prescription medicine daily to prevent HIV infection. This is called pre-exposure prophylaxis (PrEP). You are considered at risk if:  You are sexually active and do not regularly use condoms or know the HIV status of your partner(s).  You take drugs by injection.  You are sexually active with a partner who has HIV. Talk with your health care provider about whether you are at high risk of being infected with HIV. If you choose to begin PrEP, you should first be tested for HIV. You should then be tested every 3 months for as long as you are taking PrEP.  PREGNANCY   If you are premenopausal and you may become pregnant, ask your health care provider about  preconception counseling.  If you may become pregnant, take 400 to 800 micrograms (mcg) of folic acid every day.  If you want to prevent pregnancy, talk to your health care provider about birth control (contraception). OSTEOPOROSIS AND MENOPAUSE   Osteoporosis is a disease in which the bones lose minerals and strength with aging. This can result in serious bone fractures. Your risk for osteoporosis can be identified using a bone density scan.  If you are 31 years of age or older, or if you are at risk for osteoporosis and fractures, ask your health care provider if you should be screened.  Ask your health care provider whether you should take a calcium or vitamin D supplement to lower your risk for osteoporosis.  Menopause may have certain physical symptoms and risks.  Hormone replacement therapy may reduce some of these symptoms and risks. Talk to your health care provider about whether hormone replacement therapy is right for you.  HOME CARE INSTRUCTIONS   Schedule regular health, dental, and eye exams.  Stay current with your immunizations.   Do not use any tobacco products including cigarettes, chewing tobacco, or electronic cigarettes.  If you are pregnant, do not drink alcohol.  If you are breastfeeding, limit how much and how often you drink alcohol.  Limit alcohol intake to no more than 1 drink per day for nonpregnant women. One drink equals 12 ounces of beer, 5 ounces of wine, or 1 ounces of hard liquor.  Do not use street drugs.  Do not share needles.  Ask your health care provider for help if you need support or information about quitting drugs.  Tell your health care provider if you often feel depressed.  Tell your health care provider if you have ever been abused or do not feel safe at home.  This information is not intended to replace advice given to you by your health care provider. Make sure you discuss any questions you have with your health care  provider.   Document Released: 09/12/2010 Document Revised: 03/20/2014 Document Reviewed: 01/29/2013 Elsevier Interactive Patient Education [employer].

## 2015-05-04 NOTE — Assessment & Plan Note (Addendum)
Mild, intermittent  Ha been controlled for years without any inhalers-uses only as needed for URIs

## 2015-05-04 NOTE — Assessment & Plan Note (Signed)
BP Readings from Last 3 Encounters:  05/04/15 138/82  04/06/15 138/84  02/15/15 143/58   Controlled, but borderline high Continue to work on lifestyle changes  Exercise, weight loss Low-sodium diet

## 2015-05-04 NOTE — Assessment & Plan Note (Signed)
Controlled continue omeprazole daily

## 2015-05-04 NOTE — Assessment & Plan Note (Signed)
Using cpap nightly

## 2015-05-04 NOTE — Progress Notes (Signed)
Pre visit review using our clinic review tool, if applicable. No additional management support is needed unless otherwise documented below in the visit note. 

## 2015-05-04 NOTE — Assessment & Plan Note (Addendum)
With exertion only, chronic No change Work on weight loss and increasing exercise

## 2015-05-04 NOTE — Assessment & Plan Note (Signed)
Stressed weight loss Discussed increased risk of cirrhosis

## 2015-05-04 NOTE — Progress Notes (Signed)
Subjective:    Patient ID: Kayla Maddox, female    DOB: 11-23-1945, 70 y.o.   MRN: [id number]  HPI She is here to establish with a new pcp and for a wellness/physical exam.   She has a lump under his skin in her right upper outer thigh.  She noticed it nine months ago.  It is not painful and has not changed in size.   She has a persistent soreness in her right lower, lateral leg.  She denies trauma.  It started about two weeks ago.  She denies bruising or swelling.  She denies any change in the soreness.  She denies calf pain.  She denies change in activity or prolonged inactivity.    Her legs gets swollen at the end of the day. This has been going on for about one year.  She sits all day at work and does not exercise.  She recently took an antibiotic for two weeks for a URI, she sill has nasal congestion, bleeding when she blows her nose. Her other symptoms have improved or resolved.   Here for medicare wellness.   I have personally reviewed and have noted 1.The patient's medical and social history 2.Their use of alcohol, tobacco or illicit drugs 3.Their current medications and supplements 4.The patient's functional ability including ADL's, fall risks, home safety risks and                 hearing or visual impairment. 5.Diet and physical activities 6.Evidence for depression or mood disorders   Are there smokers in your home (other than you)? No  Risk Factors Exercise: none, but will start when she retires in June Dietary issues discussed: well balanced diet  Cardiac risk factors: advanced age (older than 17 for men, 15 for women), hypertension, hyperlipidemia, and obesity (BMI >= 30 kg/m2).  Depression Screen  Have you felt down, depressed or hopeless? No  Have you felt little interest or pleasure in doing things?  No  Activities of Daily Living In your present state of health, do you have any difficulty performing  the following activities?:  Driving? Yes Managing money?  Yes Feeding yourself? Yes Getting from bed to chair? Yes Climbing a flight of stairs? Yes Preparing food and eating?: Yes Bathing or showering? Yes Getting dressed: Yes Getting to/using the toilet? Yes Moving around from place to place: Yes In the past year have you fallen or had a near fall?: No   Are you sexually active?  yes  Do you have more than one partner?  no  Hearing Difficulties: No Do you often ask people to speak up or repeat themselves? No Do you experience ringing or noises in your ears? No Do you have difficulty understanding soft or whispered voices? No Vision:              Any change in vision:  No             Up to date with eye exam:   Yes Memory:  Do you feel that you have a problem with memory? No  Do you often misplace items? No  Do you feel safe at home?  Yes  Cognitive Testing  Alert, Orientated? Yes  Normal Appearance? Yes  Recall of three objects?  Yes  Can perform simple calculations? Yes  Displays appropriate judgment? Yes  Can read the correct time from a watch face? Yes   Advanced Directives have been discussed with the patient? Yes  Medications and allergies  reviewed with patient and updated if appropriate.  Patient Active Problem List   Diagnosis Date Noted  . Abdominal cramping 02/16/2015  . Sensation of feeling hot 02/16/2015  . Obesity 11/13/2014  . Dizziness 06/12/2014  . Chest tightness 04/29/2014  . Flushing 04/29/2014  . Hematuria, gross 04/16/2014  . Back pain with sciatica 11/06/2013  . Anxiety state 10/28/2013  . Crohn's colitis ([place]) 10/21/2013  . Adenomatous colon polyp 10/21/2013  . Gastroesophageal reflux disease without esophagitis 10/21/2013  . Hyperlipidemia 02/10/2010  . Essential hypertension 02/10/2010  . ASTHMA 02/10/2010  . OSA (obstructive sleep apnea) 02/10/2010  . DYSPNEA 02/10/2010  . Upper airway cough syndrome 02/09/2010    Current  Outpatient Prescriptions on File Prior to Visit  Medication Sig Dispense Refill  . ALPRAZolam (XANAX) 0.25 MG tablet Take 1 tablet (0.25 mg total) by mouth daily as needed for anxiety. 30 tablet 3  . dextromethorphan (DELSYM) 30 MG/5ML liquid Take by mouth as needed for cough.    . hydrochlorothiazide (HYDRODIURIL) 25 MG tablet Take 1 tablet (25 mg total) by mouth daily. 30 tablet 6  . mesalamine (LIALDA) 1.2 g EC tablet Take 4 tablets (4.8 g total) by mouth daily with breakfast. 120 tablet 3  . omeprazole (PRILOSEC) 40 MG capsule Take 1 capsule (40 mg total) by mouth daily. 30 capsule 3  . Probiotic Product (PROBIOTIC ADVANCED PO) Take 1 capsule by mouth daily.    . simvastatin (ZOCOR) 20 MG tablet Take 1 tablet (20 mg total) by mouth at bedtime. 30 tablet 6   No current facility-administered medications on file prior to visit.    Past Medical History  Diagnosis Date  . Anxiety   . Allergic rhinitis   . Hypertension   . Hyperlipidemia   . Obesity   . Sleep apnea   . Diverticulosis   . Hyperplastic colon polyp   . Tubular adenoma of colon   . Chronic colitis   . Gastric heterotopia   . Fundic gland polyps of stomach, benign   . GERD (gastroesophageal reflux disease)   . Crohn's colitis [facility])     Past Surgical History  Procedure Laterality Date  . Nasal sinus surgery  2012  . Foot surgery Left 2005  . Spine surgery  1994  . Bladder suspension  2011    Social History   Social History  . Marital Status: Married    Spouse Name: N/A  . Number of Children: 1  . Years of Education: N/A   Occupational History  . office clerk    Social History Main Topics  . Smoking status: Former Smoker -- 1.00 packs/day for 20 years    Types: Cigarettes    Quit date: 03/13/1981  . Smokeless tobacco: Never Used  . Alcohol Use: No  . Drug Use: No  . Sexual Activity: Not Asked   Other Topics Concern  . None   Social History Narrative   No exercise      Retiring in June 2017     Family History  Problem Relation Age of Onset  . Diabetes Father   . Hypertension Father   . Heart disease Father   . Hypertension Mother   . Heart disease Mother   . Colon cancer Neg Hx   . Esophageal cancer Neg Hx   . Rectal cancer Neg Hx   . Stomach cancer Neg Hx     Review of Systems  Constitutional: Negative for fever, chills, appetite change and fatigue.  HENT: Positive for  congestion and postnasal drip. Negative for ear pain, sinus pressure and sore throat.   Eyes: Negative for visual disturbance.  Respiratory: Positive for cough (PND, mucus ) and shortness of breath (with exertion). Negative for wheezing.   Cardiovascular: Positive for leg swelling. Negative for chest pain and palpitations.  Gastrointestinal: Negative for nausea, abdominal pain, diarrhea, constipation and blood in stool.       No GERD  Endocrine: Negative for polydipsia and polyuria.  Genitourinary: Positive for frequency. Negative for dysuria and hematuria.  Musculoskeletal: Positive for back pain.  Skin: Negative for rash.  Neurological: Negative for dizziness, light-headedness, numbness and headaches.  Psychiatric/Behavioral: Negative for sleep disturbance and dysphoric mood. The patient is nervous/anxious (occasional).        Objective:   Filed Vitals:   05/04/15 0833  BP: 138/82  Pulse: 84  Temp: 97.8 F (36.6 C)  Resp: 16   Filed Weights   05/04/15 0833  Weight: 226 lb (102.513 kg)   Body mass index is 37.61 kg/(m^2).   Physical Exam Constitutional: She appears well-developed and well-nourished. No distress.  HENT:  Head: Normocephalic and atraumatic.  Right Ear: External ear normal. Normal ear canal and TM Left Ear: External ear normal.  Normal ear canal and TM Mouth/Throat: Oropharynx is clear and moist.  Normal bilateral ear canals and tympanic membranes  Eyes: Conjunctivae and EOM are normal.  Neck: Neck supple. No tracheal deviation present. No thyromegaly present.  No  carotid bruit  Cardiovascular: Normal rate, regular rhythm and normal heart sounds.   No murmur heard.  No edema. Pulmonary/Chest: Effort normal and breath sounds normal. No respiratory distress. She has no wheezes. She has no rales.  Breast: deferred to Gyn Abdominal: Soft. She exhibits no distension. There is no tenderness.  Lymphadenopathy: She has no cervical adenopathy.  Skin: Skin is warm and dry. She is not diaphoretic. lump that is nontender and superficial right outer upper leg - likely a lipoma; right lateral calf without bruising or swelling, slightly tender - focal Psychiatric: She has a normal mood and affect. Her behavior is normal.         Assessment & Plan:   Wellness Exam: Immunizations  -pneumovax today, other vaccines up to date EKG - done 2016, no need to repeat today Colonoscopy Up to date Mammogram dexa   Up to date Eye exam - up to date Hearing loss - none Memory concerns/difficulties - none outside of simple recall, which she feels is normal Independent of ADLs - yes   Physical exam: Screening blood work ordered Immunizations -pneumovax today, other vaccines up to date Colonoscopy  Up to date Mammogram  Up to date - done 02/25/15 Gyn up to date Dexa  Up to date Eye exams up todate EKG - done last year, will not repeat today Exercise - none now, but plans on starting after she retires in June - stressed the importance of exercise for several reasons - start soon and exercise when she is able for now and then regularly after retiring Weight -- work on weight loss - she understands the importance of losing weight Skin  - no concern, likely lipoma in right leg - she will just monitor Substance abuse - none  Monitor lipoma and tenderness in right lower leg.  I think she is low risk for a dvt so we will not get an [place] today, but if no improvement or any worsening she will call   See Problem List for Assessment and Plan of  chronic medical problems.

## 2015-05-05 LAB — HEPATITIS C ANTIBODY: HCV AB: NEGATIVE

## 2015-05-06 ENCOUNTER — Encounter: Payer: Self-pay | Admitting: Internal Medicine

## 2015-05-06 DIAGNOSIS — E118 Type 2 diabetes mellitus with unspecified complications: Secondary | ICD-10-CM | POA: Insufficient documentation

## 2015-05-06 DIAGNOSIS — E1169 Type 2 diabetes mellitus with other specified complication: Secondary | ICD-10-CM | POA: Insufficient documentation

## 2015-05-06 DIAGNOSIS — R7303 Prediabetes: Secondary | ICD-10-CM | POA: Insufficient documentation

## 2015-07-24 ENCOUNTER — Other Ambulatory Visit: Payer: Self-pay | Admitting: Internal Medicine

## 2015-08-20 ENCOUNTER — Ambulatory Visit: Payer: BLUE CROSS/BLUE SHIELD | Admitting: Pulmonary Disease

## 2015-09-02 ENCOUNTER — Other Ambulatory Visit: Payer: Self-pay | Admitting: Emergency Medicine

## 2015-09-02 MED ORDER — HYDROCHLOROTHIAZIDE 25 MG PO TABS: 25.0000 mg | ORAL_TABLET | Freq: Every day | ORAL | Status: DC

## 2015-09-27 ENCOUNTER — Other Ambulatory Visit: Payer: Self-pay | Admitting: Internal Medicine

## 2015-09-28 ENCOUNTER — Telehealth: Payer: Self-pay | Admitting: Internal Medicine

## 2015-09-29 MED ORDER — MESALAMINE 1.2 G PO TBEC: DELAYED_RELEASE_TABLET | ORAL | Status: DC

## 2015-09-29 NOTE — Telephone Encounter (Signed)
Rx sent 

## 2015-11-03 ENCOUNTER — Ambulatory Visit: Payer: BLUE CROSS/BLUE SHIELD | Admitting: Pulmonary Disease

## 2015-11-09 ENCOUNTER — Telehealth: Payer: Self-pay | Admitting: *Deleted

## 2015-11-09 NOTE — Telephone Encounter (Signed)
Rec'd fax pt requesting refill on Alprazolam 0.14m. Last filled 06/13/14.../Johny Chess

## 2015-11-10 MED ORDER — ALPRAZOLAM 0.25 MG PO TABS: 0.2500 mg | ORAL_TABLET | Freq: Every day | ORAL | 0 refills | Status: DC | PRN

## 2015-11-10 NOTE — Telephone Encounter (Signed)
Called refill into pharmacy had to leave on pharmacy vm...Kayla Maddox

## 2015-11-10 NOTE — Telephone Encounter (Signed)
ok 

## 2015-11-23 ENCOUNTER — Encounter: Payer: Self-pay | Admitting: *Deleted

## 2015-12-13 ENCOUNTER — Ambulatory Visit (INDEPENDENT_AMBULATORY_CARE_PROVIDER_SITE_OTHER): Payer: Medicare Other | Admitting: Internal Medicine

## 2015-12-13 ENCOUNTER — Encounter: Payer: Self-pay | Admitting: Internal Medicine

## 2015-12-13 ENCOUNTER — Other Ambulatory Visit (INDEPENDENT_AMBULATORY_CARE_PROVIDER_SITE_OTHER): Payer: Medicare Other

## 2015-12-13 VITALS — BP 124/70 | HR 83 | Ht 65.0 in | Wt 215.4 lb

## 2015-12-13 DIAGNOSIS — K219 Gastro-esophageal reflux disease without esophagitis: Secondary | ICD-10-CM | POA: Diagnosis not present

## 2015-12-13 DIAGNOSIS — Z86010 Personal history of colonic polyps: Secondary | ICD-10-CM | POA: Diagnosis not present

## 2015-12-13 DIAGNOSIS — K5010 Crohn's disease of large intestine without complications: Secondary | ICD-10-CM

## 2015-12-13 LAB — COMPREHENSIVE METABOLIC PANEL
ALT: 17 U/L (ref 0–35)
AST: 16 U/L (ref 0–37)
Albumin: 3.9 g/dL (ref 3.5–5.2)
Alkaline Phosphatase: 54 U/L (ref 39–117)
BUN: 15 mg/dL (ref 6–23)
CHLORIDE: 102 meq/L (ref 96–112)
CO2: 33 mEq/L — ABNORMAL HIGH (ref 19–32)
Calcium: 9.6 mg/dL (ref 8.4–10.5)
Creatinine, Ser: 0.72 mg/dL (ref 0.40–1.20)
GFR: 85.46 mL/min (ref >60.00)
GLUCOSE: 85 mg/dL (ref 70–99)
POTASSIUM: 4.3 meq/L (ref 3.5–5.1)
SODIUM: 141 meq/L (ref 135–145)
Total Bilirubin: 0.4 mg/dL (ref 0.2–1.2)
Total Protein: 6.9 g/dL (ref 6.0–8.3)

## 2015-12-13 LAB — HIGH SENSITIVITY CRP: CRP HIGH SENSITIVITY: 1.98 mg/L (ref 0.000–5.000)

## 2015-12-13 LAB — CBC WITH DIFFERENTIAL/PLATELET
BASOS PCT: 1.2 % (ref 0.0–3.0)
Basophils Absolute: 0.1 10*3/uL (ref 0.0–0.1)
EOS PCT: 2.4 % (ref 0.0–5.0)
Eosinophils Absolute: 0.2 10*3/uL (ref 0.0–0.7)
HCT: 42.4 % (ref 36.0–46.0)
HEMOGLOBIN: 14.6 g/dL (ref 12.0–15.0)
LYMPHS ABS: 2.5 10*3/uL (ref 0.7–4.0)
Lymphocytes Relative: 32.1 % (ref 12.0–46.0)
MCHC: 34.4 g/dL (ref 30.0–36.0)
MCV: 85.8 fl (ref 78.0–100.0)
MONO ABS: 0.5 10*3/uL (ref 0.1–1.0)
MONOS PCT: 7.1 % (ref 3.0–12.0)
Neutro Abs: 4.4 10*3/uL (ref 1.4–7.7)
Neutrophils Relative %: 57.2 % (ref 43.0–77.0)
Platelets: 249 10*3/uL (ref 150.0–400.0)
RBC: 4.94 Mil/uL (ref 3.87–5.11)
RDW: 13.1 % (ref 11.5–15.5)
WBC: 7.7 10*3/uL (ref 4.0–10.5)

## 2015-12-13 MED ORDER — MESALAMINE 1.2 G PO TBEC: DELAYED_RELEASE_TABLET | ORAL | 2 refills | Status: DC

## 2015-12-13 MED ORDER — OMEPRAZOLE 40 MG PO CPDR: 40.0000 mg | DELAYED_RELEASE_CAPSULE | Freq: Every day | ORAL | 2 refills | Status: DC

## 2015-12-13 NOTE — Patient Instructions (Signed)
Your physician has requested that you go to the basement for the following lab work before leaving today: CRP, CBC, CMP  We have sent the following medications to your pharmacy for you to pick up at your convenience: Lialda Omeprazole  You will be due for a recall colonoscopy in 08/2016. We will send you a reminder in the mail when it gets closer to that time.  You will be due for an abdominal ultrasound in 04/2016. We will contact you with an appointment when it gets closer to that time.  Please follow up with Dr Hilarie Fredrickson in 1 year.  If you are age 68 or older, your body mass index should be between 23-30. Your Body mass index is 35.84 kg/m. If this is out of the aforementioned range listed, please consider follow up with your Primary Care Provider.  If you are age 74 or younger, your body mass index should be between 19-25. Your Body mass index is 35.84 kg/m. If this is out of the aformentioned range listed, please consider follow up with your Primary Care Provider.

## 2015-12-13 NOTE — Progress Notes (Signed)
Subjective:    Patient ID: Kayla Maddox, female    DOB: 1945-05-30, 70 y.o.   MRN: [id number]  HPI Kayla Maddox is a 70 year old female with a history of chronic Crohn's disease, adenomatous colon polyps, GERD and gastritis, small gallbladder polyp is here for follow-up. She was last seen in January 2017. That time she was having issues with upper abdominal pressure and bloating. Ultrasound was performed which excluded gallstones but showed a small gallbladder polyp. We tried started omeprazole 40 mg daily which helps slightly but symptoms persisted. We then increase Lialda 4.8 g daily from 2.4 g daily and her symptoms resolved. She reports she's been doing very well recently.  She reports good appetite. No issues with significant heartburn, dysphagia or odynophagia. No abdominal pain. Bowel habits are regular without blood or melena. She has 2 bowel movements per day. She denies abdominal pain. The abdominal pressure and bloating has resolved. Appetite is good. Weight is stable.   Review of Systems As per history of present illness, otherwise negative  Current Medications, Allergies, Past Medical History, Past Surgical History, Family History and Social History were reviewed in [employer] record.     Objective:   Physical Exam BP 124/70   Pulse 83   Ht 5' 5"  (1.651 m)   Wt 215 lb 6 oz (97.7 kg)   SpO2 93%   BMI 35.84 kg/m  Constitutional: Well-developed and well-nourished. No distress. HEENT: Normocephalic and atraumatic. Oropharynx is clear and moist. No oropharyngeal exudate. Conjunctivae are normal.  No scleral icterus. Neck: Neck supple. Trachea midline. Cardiovascular: Normal rate, regular rhythm and intact distal pulses. No M/R/G Pulmonary/chest: Effort normal and breath sounds normal. No wheezing, rales or rhonchi. Abdominal: Soft, Obese, nontender, nondistended. Bowel sounds active throughout. Extremities: no clubbing, cyanosis, or  edema Lymphadenopathy: No cervical adenopathy noted. Neurological: Alert and oriented to person place and time. Skin: Skin is warm and dry. No rashes noted. Psychiatric: Normal mood and affect. Behavior is normal.  CBC    Component Value Date/Time   WBC 7.6 05/04/2015 0950   RBC 5.43 (H) 05/04/2015 0950   HGB 15.6 (H) 05/04/2015 0950   HCT 46.5 (H) 05/04/2015 0950   PLT 260.0 05/04/2015 0950   MCV 85.7 05/04/2015 0950   MCHC 33.5 05/04/2015 0950   RDW 13.7 05/04/2015 0950   LYMPHSABS 2.1 05/04/2015 0950   MONOABS 0.5 05/04/2015 0950   EOSABS 0.1 05/04/2015 0950   BASOSABS 0.1 05/04/2015 0950   CMP     Component Value Date/Time   NA 141 05/04/2015 0950   K 3.9 05/04/2015 0950   CL 100 05/04/2015 0950   CO2 34 (H) 05/04/2015 0950   GLUCOSE 111 (H) 05/04/2015 0950   BUN 10 05/04/2015 0950   CREATININE 0.79 05/04/2015 0950   CALCIUM 9.4 05/04/2015 0950   PROT 7.2 05/04/2015 0950   ALBUMIN 4.1 05/04/2015 0950   AST 18 05/04/2015 0950   ALT 18 05/04/2015 0950   ALKPHOS 61 05/04/2015 0950   BILITOT 0.7 05/04/2015 0950   ABDOMEN ULTRASOUND COMPLETE   COMPARISON:  None.   FINDINGS: Gallbladder: Well distended without significant gallbladder wall thickening. A focal echogenic focus which is non mobile identified along the inferior gallbladder wall likely representing a small gallbladder polyp.   Common bile duct: Diameter: 4.9 mm.   Liver: Mild increased echogenicity which may be related to fatty infiltration.   IVC: No abnormality visualized.   Pancreas: Tail not visualized due to overlying bowel gas.  Spleen: Size and appearance within normal limits.   Right Kidney: Length: 11.7 cm. Echogenicity within normal limits. No mass or hydronephrosis visualized.   Left Kidney: Length: 11.3 cm. Echogenicity within normal limits. No mass or hydronephrosis visualized.   Abdominal aorta: No aneurysm visualized.   Other findings: None.   IMPRESSION: Small  gallbladder polyp without complicating factors.   Fatty liver.     Electronically Signed   By: Inez Catalina M.D.   On: 04/26/2015 10:23       Assessment & Plan:   70 year old female with a history of chronic Crohn's disease, adenomatous colon polyps, GERD and gastritis, small gallbladder polyp is here for follow-up.  1. Crohn's colitis (hepatic flexure and transverse colon)-- in clinical remission. Continue Lialda 4.8 g daily. She will be due surveillance colonoscopy in June 2018. We discussed this test including the risks, benefits and alternatives and she wishes to proceed at that time. Annual flu vaccine recommended. We will assess overall disease activity at the time of her surveillance colonoscopy.3  2. GERD with history of gastritis -- continue omeprazole 40 mg daily. GERD diet recommended  3. History of adenomatous colon polyps -- surveillance colonoscopy as above  Annual office follow-up Labs today to include CBC CMP and CRP  25 minutes spent with the patient today. Greater than 50% was spent in counseling and coordination of care with the patient

## 2015-12-14 ENCOUNTER — Encounter: Payer: Self-pay | Admitting: Internal Medicine

## 2016-03-20 LAB — HM MAMMOGRAPHY

## 2016-03-27 ENCOUNTER — Other Ambulatory Visit: Payer: Self-pay | Admitting: Internal Medicine

## 2016-03-28 ENCOUNTER — Encounter: Payer: Self-pay | Admitting: Geriatric Medicine

## 2016-04-12 ENCOUNTER — Telehealth: Payer: Self-pay | Admitting: *Deleted

## 2016-04-12 DIAGNOSIS — K824 Cholesterolosis of gallbladder: Secondary | ICD-10-CM

## 2016-04-12 NOTE — Telephone Encounter (Signed)
Patient has been scheduled for limited RUQ abdominal ultrsound (for follow up of gallbladder polyp) at [facility] on Friday 04/28/16 @ 9:00 am. NPO 6 hours prior to appt. Patient has been advised of this and verbalizes understanding.

## 2016-04-12 NOTE — Telephone Encounter (Signed)
-----   Message from Larina Bras, [place] sent at 12/13/2015 11:47 AM EDT ----- Paitent needs ultrasound gallbladder only in 04/2016 for surveillence of gallbladder polyp. See 12/13/15 office note. Call pt and advise

## 2016-04-14 ENCOUNTER — Ambulatory Visit (INDEPENDENT_AMBULATORY_CARE_PROVIDER_SITE_OTHER): Payer: Medicare Other | Admitting: Internal Medicine

## 2016-04-14 ENCOUNTER — Encounter: Payer: Self-pay | Admitting: Internal Medicine

## 2016-04-14 VITALS — BP 134/76 | HR 89 | Temp 98.9°F | Resp 16 | Wt 216.0 lb

## 2016-04-14 DIAGNOSIS — J0100 Acute maxillary sinusitis, unspecified: Secondary | ICD-10-CM

## 2016-04-14 MED ORDER — BENZONATATE 200 MG PO CAPS: 200.0000 mg | ORAL_CAPSULE | Freq: Three times a day (TID) | ORAL | 1 refills | Status: DC | PRN

## 2016-04-14 MED ORDER — CEFDINIR 300 MG PO CAPS: 300.0000 mg | ORAL_CAPSULE | Freq: Two times a day (BID) | ORAL | 0 refills | Status: DC

## 2016-04-14 NOTE — Progress Notes (Signed)
Subjective:    Patient ID: Kayla Maddox, female    DOB: [birth year], 71 y.o.   MRN: [id number]  HPI She is here for an acute visit for cold symptoms.   Her symptoms started about one week ago.   She is experiencing headaches, facial pressure/ sinus pain, teeth pain, roof of mouth itches and a cough. .  She denies fever, ear pain, sore throat, SOB, wheeze and GI symptoms.   She has tried taking delsym, mucinex and dayquil with some improvement in symptoms.   Medications and allergies reviewed with patient and updated if appropriate.  Patient Active Problem List   Diagnosis Date Noted  . Prediabetes 05/06/2015  . Fatty liver 05/04/2015  . Abdominal cramping 02/16/2015  . Sensation of feeling hot 02/16/2015  . Obesity 11/13/2014  . Dizziness 06/12/2014  . Chest tightness 04/29/2014  . Flushing 04/29/2014  . Hematuria, gross 04/16/2014  . Back pain with sciatica 11/06/2013  . Anxiety state 10/28/2013  . Crohn's colitis ([place]) 10/21/2013  . Adenomatous colon polyp 10/21/2013  . Gastroesophageal reflux disease without esophagitis 10/21/2013  . Hyperlipidemia 02/10/2010  . Essential hypertension 02/10/2010  . Asthma 02/10/2010  . OSA (obstructive sleep apnea) 02/10/2010  . DYSPNEA 02/10/2010  . Upper airway cough syndrome 02/09/2010    Current Outpatient Prescriptions on File Prior to Visit  Medication Sig Dispense Refill  . ALPRAZolam (XANAX) 0.25 MG tablet Take 1 tablet (0.25 mg total) by mouth daily as needed for anxiety. 30 tablet 0  . dextromethorphan (DELSYM) 30 MG/5ML liquid Take by mouth as needed for cough.    . fluticasone (FLONASE) 50 MCG/ACT nasal spray INHALE 2 SPRAYS IN EACH NOSTRIL EVERY DAY  11  . hydrochlorothiazide (HYDRODIURIL) 25 MG tablet TAKE ONE TABLET BY MOUTH DAILY 30 tablet 1  . mesalamine (LIALDA) 1.2 g EC tablet TAKE 4 TABLETS EVERY DAY WITH BREAKFAST 360 tablet 2  . omeprazole (PRILOSEC) 40 MG capsule Take 1 capsule (40 mg total) by mouth daily.  90 capsule 2   No current facility-administered medications on file prior to visit.     Past Medical History:  Diagnosis Date  . Allergic rhinitis   . Anxiety   . Chronic colitis   . Crohn's colitis ([place])   . Diverticulosis   . Fundic gland polyps of stomach, benign   . Gallbladder polyp 2017  . Gastric heterotopia   . GERD (gastroesophageal reflux disease)   . Hyperlipidemia   . Hyperplastic colon polyp   . Hypertension   . Obesity   . Sleep apnea   . Tubular adenoma of colon     Past Surgical History:  Procedure Laterality Date  . BLADDER SUSPENSION  2011  . FOOT SURGERY Left 2005  . NASAL SINUS SURGERY  2012  . [place]    Social History   Social History  . Marital status: Married    Spouse name: N/A  . Number of children: 1  . 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 Smoking status: Former Smoker    Packs/day: 1.00    Years: 20.00    Types: Cigarettes    Quit date: 03/13/1981  . Smokeless tobacco: Never Used  . Alcohol use No  . Drug use: No  . Sexual activity: Not on file   Other Topics Concern  . Not on file   Social History Narrative   No exercise  Retiring in June 2017    Family History  Problem Relation Age of Onset  . Diabetes Father   . Hypertension Father   . Heart disease Father   . Hypertension Mother   . Heart disease Mother   . Colon cancer Neg Hx   . Esophageal cancer Neg Hx   . Rectal cancer Neg Hx   . Stomach cancer Neg Hx     Review of Systems  Constitutional: Negative for fever.  HENT: Positive for congestion, sinus pain (teeth pain) and sinus pressure. Negative for ear pain and sore throat.   Respiratory: Positive for cough. Negative for chest tightness, shortness of breath and wheezing.   Gastrointestinal: Negative for abdominal pain, diarrhea and nausea.  Musculoskeletal: Negative for myalgias.  Neurological: Positive for headaches.        Objective:   Vitals:   04/14/16 1630  BP: 134/76  Pulse: 89  Resp: 16  Temp: 98.9 F (37.2 C)   Filed Weights   04/14/16 1630  Weight: 216 lb (98 kg)   Body mass index is 35.94 kg/m.  Wt Readings from Last 3 Encounters:  04/14/16 216 lb (98 kg)  12/13/15 215 lb 6 oz (97.7 kg)  05/04/15 226 lb (102.5 kg)     Physical Exam GENERAL APPEARANCE: Appears stated age, well appearing, NAD EYES: conjunctiva clear, no icterus HEENT: bilateral tympanic membranes and ear canals normal, oropharynx with mild erythema, no thyromegaly, trachea midline, no cervical or supraclavicular lymphadenopathy LUNGS: Clear to auscultation without wheeze or crackles, unlabored breathing, good air entry bilaterally HEART: Normal S1,S2 without murmurs EXTREMITIES: Without clubbing, cyanosis, or edema       Assessment & Plan:   See Problem List for Assessment and Plan of chronic medical problems.

## 2016-04-14 NOTE — Progress Notes (Signed)
Pre visit review using our clinic review tool, if applicable. No additional management support is needed unless otherwise documented below in the visit note. 

## 2016-04-14 NOTE — Patient Instructions (Signed)
Your prescription(s) have been submitted to your pharmacy or been printed and provided for you. Please take as directed and contact our office if you believe you are having problem(s) with the medication(s) or have any questions.  If your symptoms worsen or fail to improve, please contact our office for further instruction, or in case of emergency go directly to the emergency room at the closest medical facility.   General Recommendations:    Please drink plenty of fluids.  Get plenty of rest   Sleep in humidified air  Use saline nasal sprays  Netti pot  OTC Medications:  Decongestants - helps relieve congestion   Flonase (generic fluticasone) or Nasacort (generic triamcinolone) - please make sure to use the "cross-over" technique at a 45 degree angle towards the opposite eye as opposed to straight up the nasal passageway.   Sudafed (generic pseudoephedrine - Note this is the one that is available behind the pharmacy counter); Products with phenylephrine (-PE) may also be used but is often not as effective as pseudoephedrine.   If you have HIGH BLOOD PRESSURE - Coricidin HBP; AVOID any product that is -D as this contains pseudoephedrine which may increase your blood pressure.  Afrin (oxymetazoline) every 6-8 hours for up to 3 days.  Allergies - helps relieve runny nose, itchy eyes and sneezing   Claritin (generic loratidine), Allegra (fexofenidine), or Zyrtec (generic cyrterizine) for runny nose. These medications should not cause drowsiness.  Note - Benadryl (generic diphenhydramine) may be used however may cause drowsiness  Cough -   Delsym or Robitussin (generic dextromethorphan)  Expectorants - helps loosen mucus to ease removal   Mucinex (generic guaifenesin) as directed on the package.  Headaches / General Aches   Tylenol (generic acetaminophen) - DO NOT EXCEED 3 grams (3,000 mg) in a 24 hour time period  Advil/Motrin (generic  ibuprofen)  Sore Throat -   Salt water gargle   Chloraseptic (generic benzocaine) spray or lozenges / Sucrets (generic dyclonine)

## 2016-04-14 NOTE — Assessment & Plan Note (Signed)
Moderate in nature Probable bacterial infection Start omnicef Flonase, mucinex, dayquil, benzoate as needed Rest, fluids Call if no improvement

## 2016-04-28 ENCOUNTER — Ambulatory Visit (HOSPITAL_COMMUNITY)
Admission: RE | Admit: 2016-04-28 | Discharge: 2016-04-28 | Disposition: A | Discharge status: Home / Self Care | Payer: Medicare Other | Source: Ambulatory Visit | Attending: Internal Medicine | Admitting: Internal Medicine

## 2016-04-28 DIAGNOSIS — K824 Cholesterolosis of gallbladder: Secondary | ICD-10-CM | POA: Insufficient documentation

## 2016-05-12 ENCOUNTER — Encounter: Payer: Medicare Other | Admitting: Internal Medicine

## 2016-05-24 ENCOUNTER — Encounter: Payer: Self-pay | Admitting: Internal Medicine

## 2016-05-24 ENCOUNTER — Ambulatory Visit (INDEPENDENT_AMBULATORY_CARE_PROVIDER_SITE_OTHER): Payer: Medicare Other | Admitting: Internal Medicine

## 2016-05-24 ENCOUNTER — Other Ambulatory Visit (INDEPENDENT_AMBULATORY_CARE_PROVIDER_SITE_OTHER): Payer: Medicare Other

## 2016-05-24 VITALS — BP 136/80 | HR 71 | Temp 97.9°F | Resp 16 | Ht 65.0 in | Wt 217.0 lb

## 2016-05-24 DIAGNOSIS — F411 Generalized anxiety disorder: Secondary | ICD-10-CM | POA: Diagnosis not present

## 2016-05-24 DIAGNOSIS — R079 Chest pain, unspecified: Secondary | ICD-10-CM | POA: Diagnosis not present

## 2016-05-24 DIAGNOSIS — K5010 Crohn's disease of large intestine without complications: Secondary | ICD-10-CM

## 2016-05-24 DIAGNOSIS — Z0000 Encounter for general adult medical examination without abnormal findings: Secondary | ICD-10-CM | POA: Diagnosis not present

## 2016-05-24 DIAGNOSIS — K219 Gastro-esophageal reflux disease without esophagitis: Secondary | ICD-10-CM

## 2016-05-24 DIAGNOSIS — R7303 Prediabetes: Secondary | ICD-10-CM

## 2016-05-24 DIAGNOSIS — R0789 Other chest pain: Secondary | ICD-10-CM | POA: Insufficient documentation

## 2016-05-24 DIAGNOSIS — E7800 Pure hypercholesterolemia, unspecified: Secondary | ICD-10-CM

## 2016-05-24 DIAGNOSIS — I10 Essential (primary) hypertension: Secondary | ICD-10-CM

## 2016-05-24 DIAGNOSIS — R1031 Right lower quadrant pain: Secondary | ICD-10-CM | POA: Insufficient documentation

## 2016-05-24 LAB — COMPREHENSIVE METABOLIC PANEL
ALBUMIN: 4.2 g/dL (ref 3.5–5.2)
ALK PHOS: 59 U/L (ref 39–117)
ALT: 16 U/L (ref 0–35)
AST: 17 U/L (ref 0–37)
BUN: 15 mg/dL (ref 6–23)
CHLORIDE: 102 meq/L (ref 96–112)
CO2: 33 mEq/L — ABNORMAL HIGH (ref 19–32)
Calcium: 9.6 mg/dL (ref 8.4–10.5)
Creatinine, Ser: 0.74 mg/dL (ref 0.40–1.20)
GFR: 82.69 mL/min (ref >60.00)
Glucose, Bld: 102 mg/dL — ABNORMAL HIGH (ref 70–99)
POTASSIUM: 3.8 meq/L (ref 3.5–5.1)
Sodium: 142 mEq/L (ref 135–145)
TOTAL PROTEIN: 7.2 g/dL (ref 6.0–8.3)
Total Bilirubin: 0.5 mg/dL (ref 0.2–1.2)

## 2016-05-24 LAB — CBC WITH DIFFERENTIAL/PLATELET
BASOS PCT: 0.7 % (ref 0.0–3.0)
Basophils Absolute: 0 10*3/uL (ref 0.0–0.1)
EOS PCT: 1.9 % (ref 0.0–5.0)
Eosinophils Absolute: 0.1 10*3/uL (ref 0.0–0.7)
HCT: 45.4 % (ref 36.0–46.0)
HEMOGLOBIN: 15.3 g/dL — AB (ref 12.0–15.0)
LYMPHS ABS: 2.1 10*3/uL (ref 0.7–4.0)
Lymphocytes Relative: 32 % (ref 12.0–46.0)
MCHC: 33.7 g/dL (ref 30.0–36.0)
MCV: 87.2 fl (ref 78.0–100.0)
MONO ABS: 0.4 10*3/uL (ref 0.1–1.0)
Monocytes Relative: 6.8 % (ref 3.0–12.0)
NEUTROS PCT: 58.6 % (ref 43.0–77.0)
Neutro Abs: 3.9 10*3/uL (ref 1.4–7.7)
Platelets: 246 10*3/uL (ref 150.0–400.0)
RBC: 5.21 Mil/uL — AB (ref 3.87–5.11)
RDW: 13.1 % (ref 11.5–15.5)
WBC: 6.6 10*3/uL (ref 4.0–10.5)

## 2016-05-24 LAB — LIPID PANEL
Cholesterol: 251 mg/dL — ABNORMAL HIGH (ref 0–200)
HDL: 50.6 mg/dL (ref >39.00)
LDL Cholesterol: 169 mg/dL — ABNORMAL HIGH (ref 0–99)
NonHDL: 200.8
TRIGLYCERIDES: 157 mg/dL — AB (ref 0.0–149.0)
Total CHOL/HDL Ratio: 5
VLDL: 31.4 mg/dL (ref 0.0–40.0)

## 2016-05-24 LAB — HEMOGLOBIN A1C: HEMOGLOBIN A1C: 5.9 % (ref 4.6–6.5)

## 2016-05-24 LAB — VITAMIN B12: Vitamin B-12: 206 pg/mL — ABNORMAL LOW (ref 211–911)

## 2016-05-24 LAB — TSH: TSH: 1.91 u[IU]/mL (ref 0.35–4.50)

## 2016-05-24 LAB — VITAMIN D 25 HYDROXY (VIT D DEFICIENCY, FRACTURES): VITD: 28 ng/mL — AB (ref 30.00–100.00)

## 2016-05-24 MED ORDER — ALPRAZOLAM 0.25 MG PO TABS: 0.2500 mg | ORAL_TABLET | Freq: Every day | ORAL | 0 refills | Status: DC | PRN

## 2016-05-24 MED ORDER — BENZONATATE 200 MG PO CAPS: 200.0000 mg | ORAL_CAPSULE | Freq: Three times a day (TID) | ORAL | 1 refills | Status: DC | PRN

## 2016-05-24 NOTE — Progress Notes (Signed)
Subjective:    Patient ID: Kayla Maddox, female    DOB: 04-23-1945, 71 y.o.   MRN: [id number]  HPI She is here for a physical exam.   Everyday she has sensation of movement in her RLQ - will last a few seconds. She denies associated pain.  She denies any association with food, gas or bowel movements.  It occurs randomly.  She also feels full in her abdomen all the time /pressure.  She denies nausea or GERD.  She had her gyn visit and exam recently and it was normal.  Her bowels are normal.  She is due for a colonoscopy this year.  She has had increased stress related to her husband medical problems.  She feels she is tolerating it well.   Medications and allergies reviewed with patient and updated if appropriate.  Patient Active Problem List   Diagnosis Date Noted  . Prediabetes 05/06/2015  . Fatty liver 05/04/2015  . Abdominal cramping 02/16/2015  . Sensation of feeling hot 02/16/2015  . Obesity 11/13/2014  . Dizziness 06/12/2014  . Flushing 04/29/2014  . Hematuria, gross 04/16/2014  . Back pain with sciatica 11/06/2013  . Anxiety state 10/28/2013  . Crohn's colitis ([place]) 10/21/2013  . Adenomatous colon polyp 10/21/2013  . Gastroesophageal reflux disease without esophagitis 10/21/2013  . Hyperlipidemia 02/10/2010  . Essential hypertension 02/10/2010  . Asthma 02/10/2010  . OSA (obstructive sleep apnea) 02/10/2010  . DYSPNEA 02/10/2010  . Upper airway cough syndrome 02/09/2010    Current Outpatient Prescriptions on File Prior to Visit  Medication Sig Dispense Refill  . ALPRAZolam (XANAX) 0.25 MG tablet Take 1 tablet (0.25 mg total) by mouth daily as needed for anxiety. 30 tablet 0  . fluticasone (FLONASE) 50 MCG/ACT nasal spray INHALE 2 SPRAYS IN EACH NOSTRIL EVERY DAY  11  . hydrochlorothiazide (HYDRODIURIL) 25 MG tablet TAKE ONE TABLET BY MOUTH DAILY 30 tablet 1  . mesalamine (LIALDA) 1.2 g EC tablet TAKE 4 TABLETS EVERY DAY WITH BREAKFAST 360 tablet 2  . omeprazole  (PRILOSEC) 40 MG capsule Take 1 capsule (40 mg total) by mouth daily. 90 capsule 2   No current facility-administered medications on file prior to visit.     Past Medical History:  Diagnosis Date  . Allergic rhinitis   . Anxiety   . Chronic colitis   . Crohn's colitis ([place])   . Diverticulosis   . Fundic gland polyps of stomach, benign   . Gallbladder polyp 2017  . Gastric heterotopia   . GERD (gastroesophageal reflux disease)   . Hyperlipidemia   . Hyperplastic colon polyp   . Hypertension   . Obesity   . Sleep apnea   . Tubular adenoma of colon     Past Surgical History:  Procedure Laterality Date  . BLADDER SUSPENSION  2011  . FOOT SURGERY Left 2005  . NASAL SINUS SURGERY  2012  . [place]    Social History   Social History  . Marital status: Married    Spouse name: N/A  . Number of children: 1  . 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 Smoking status: Former Smoker    Packs/day: 1.00    Years: 20.00    Types: Cigarettes    Quit date: 03/13/1981  . Smokeless tobacco: Never Used  . Alcohol use No  . Drug use: No  . Sexual activity: Not on  file   Other Topics Concern  . Not on file   Social History Narrative   No exercise      Retiring in June 2017    Family History  Problem Relation Age of Onset  . Diabetes Father   . Hypertension Father   . Heart disease Father   . Hypertension Mother   . Heart disease Mother   . Colon cancer Neg Hx   . Esophageal cancer Neg Hx   . Rectal cancer Neg Hx   . Stomach cancer Neg Hx     Review of Systems  Constitutional: Negative for appetite change, chills, fatigue, fever and unexpected weight change.  Eyes: Positive for visual disturbance (floaters).  Respiratory: Positive for cough (chronic). Negative for shortness of breath and wheezing.   Cardiovascular: Positive for chest pain (transient, grabbing sensation). Negative for  palpitations and leg swelling.  Gastrointestinal: Negative for abdominal pain, blood in stool, constipation, diarrhea and nausea.  Genitourinary: Negative for dysuria and hematuria.  Musculoskeletal: Positive for arthralgias (thumbs, hips with walking) and back pain.  Skin: Negative for color change and rash.  Neurological: Negative for light-headedness.  Psychiatric/Behavioral: Negative for dysphoric mood. The patient is nervous/anxious (situational).        Objective:   Vitals:   05/24/16 0907  BP: 136/80  Pulse: 71  Resp: 16  Temp: 97.9 F (36.6 C)   Filed Weights   05/24/16 0907  Weight: 217 lb (98.4 kg)   Body mass index is 36.11 kg/m.  Wt Readings from Last 3 Encounters:  05/24/16 217 lb (98.4 kg)  04/14/16 216 lb (98 kg)  12/13/15 215 lb 6 oz (97.7 kg)     Physical Exam Constitutional: She appears well-developed and well-nourished. No distress.  HENT:  Head: Normocephalic and atraumatic.  Right Ear: External ear normal. Normal ear canal and TM Left Ear: External ear normal.  Normal ear canal and TM Mouth/Throat: Oropharynx is clear and moist.  Eyes: Conjunctivae and EOM are normal.  Neck: Neck supple. No tracheal deviation present. No thyromegaly present.  No carotid bruit  Cardiovascular: Normal rate, regular rhythm and normal heart sounds.   No murmur heard.  No edema. Pulmonary/Chest: Effort normal and breath sounds normal. No respiratory distress. She has no wheezes. She has no rales.  Breast: deferred to Gyn Abdominal: Obese, ventral hernia, Soft. She exhibits no distension. There is no tenderness.  Lymphadenopathy: She has no cervical adenopathy.  Skin: Skin is warm and dry. She is not diaphoretic.  Psychiatric: She has a normal mood and affect. Her behavior is normal.         Assessment & Plan:   Physical exam: Screening blood work  ordered Immunizations   Up to date - she states she had the flu vaccine Colonoscopy  Up to date - due this  year Mammogram  Up to date  Gyn  Up to date  Dexa  - done last friday- Dr cousins Eye exams     Up to date  - having floaters - advised f/u with eye doctor EKG - last EKG 2016 Exercise  None - encouraged regular exercise Weight - stressed weight loss Skin no concerns Substance abuse   none  See Problem List for Assessment and Plan of chronic medical problems.

## 2016-05-24 NOTE — Assessment & Plan Note (Signed)
Check lipid panel Stressed exercise, weight loss

## 2016-05-24 NOTE — Patient Instructions (Addendum)
Test(s) ordered today. Your results will be released to [place] (or called to you) after review, usually within 72hours after test completion. If any changes need to be made, you will be notified at that same time.  All other Health Maintenance issues reviewed.   All recommended immunizations and age-appropriate screenings are up-to-date or discussed.  No immunizations administered today.   Medications reviewed and updated.   No changes recommended at this time.  Your prescription(s) have been submitted to your pharmacy. Please take as directed and contact our office if you believe you are having problem(s) with the medication(s).  A stress test was ordered  Please followup in 6 months for a wellness visit with nurse and follow up with me   Health Maintenance, Female Adopting a healthy lifestyle and getting preventive care can go a long way to promote health and wellness. Talk with your health care provider about what schedule of regular examinations is right for you. This is a good chance for you to check in with your provider about disease prevention and staying healthy. In between checkups, there are plenty of things you can do on your own. Experts have done a lot of research about which lifestyle changes and preventive measures are most likely to keep you healthy. Ask your health care provider for more information. Weight and diet Eat a healthy diet  Be sure to include plenty of vegetables, fruits, low-fat dairy products, and lean protein.  Do not eat a lot of foods high in solid fats, added sugars, or salt.  Get regular exercise. This is one of the most important things you can do for your health.  Most adults should exercise for at least 150 minutes each week. The exercise should increase your heart rate and make you sweat (moderate-intensity exercise).  Most adults should also do strengthening exercises at least twice a week. This is in addition to the moderate-intensity  exercise. Maintain a healthy weight  Body mass index (BMI) is a measurement that can be used to identify possible weight problems. It estimates body fat based on height and weight. Your health care provider can help determine your BMI and help you achieve or maintain a healthy weight.  For females 12 years of age and older:  A BMI below 18.5 is considered underweight.  A BMI of 18.5 to 24.9 is normal.  A BMI of 25 to 29.9 is considered overweight.  A BMI of 30 and above is considered obese. Watch levels of cholesterol and blood lipids  You should start having your blood tested for lipids and cholesterol at 69 years of age, then have this test every 5 years.  You may need to have your cholesterol levels checked more often if:  Your lipid or cholesterol levels are high.  You are older than 69 years of age.  You are at high risk for heart disease. Cancer screening Lung Cancer  Lung cancer screening is recommended for adults 69-64 years old who are at high risk for lung cancer because of a history of smoking.  A yearly low-dose CT scan of the lungs is recommended for people who:  Currently smoke.  Have quit within the past 15 years.  Have at least a 30-pack-year history of smoking. A pack year is smoking an average of one pack of cigarettes a day for 1 year.  Yearly screening should continue until it has been 15 years since you quit.  Yearly screening should stop if you develop a health problem that would  prevent you from having lung cancer treatment. Breast Cancer  Practice breast self-awareness. This means understanding how your breasts normally appear and feel.  It also means doing regular breast self-exams. Let your health care provider know about any changes, no matter how small.  If you are in your 20s or 30s, you should have a clinical breast exam (CBE) by a health care provider every 1-3 years as part of a regular health exam.  If you are 63 or older, have a CBE  every year. Also consider having a breast X-ray (mammogram) every year.  If you have a family history of breast cancer, talk to your health care provider about genetic screening.  If you are at high risk for breast cancer, talk to your health care provider about having an MRI and a mammogram every year.  Breast cancer gene (BRCA) assessment is recommended for women who have family members with BRCA-related cancers. BRCA-related cancers include:  Breast.  Ovarian.  Tubal.  Peritoneal cancers.  Results of the assessment will determine the need for genetic counseling and BRCA1 and BRCA2 testing. Cervical Cancer  Your health care provider may recommend that you be screened regularly for cancer of the pelvic organs (ovaries, uterus, and vagina). This screening involves a pelvic examination, including checking for microscopic changes to the surface of your cervix (Pap test). You may be encouraged to have this screening done every 3 years, beginning at age 77.  For women ages 39-65, health care providers may recommend pelvic exams and Pap testing every 3 years, or they may recommend the Pap and pelvic exam, combined with testing for human papilloma virus (HPV), every 5 years. Some types of HPV increase your risk of cervical cancer. Testing for HPV may also be done on women of any age with unclear Pap test results.  Other health care providers may not recommend any screening for nonpregnant women who are considered low risk for pelvic cancer and who do not have symptoms. Ask your health care provider if a screening pelvic exam is right for you.  If you have had past treatment for cervical cancer or a condition that could lead to cancer, you need Pap tests and screening for cancer for at least 20 years after your treatment. If Pap tests have been discontinued, your risk factors (such as having a new sexual partner) need to be reassessed to determine if screening should resume. Some women have medical  problems that increase the chance of getting cervical cancer. In these cases, your health care provider may recommend more frequent screening and Pap tests. Colorectal Cancer  This type of cancer can be detected and often prevented.  Routine colorectal cancer screening usually begins at 69 years of age and continues through 69 years of age.  Your health care provider may recommend screening at an earlier age if you have risk factors for colon cancer.  Your health care provider may also recommend using home test kits to check for hidden blood in the stool.  A small camera at the end of a tube can be used to examine your colon directly (sigmoidoscopy or colonoscopy). This is done to check for the earliest forms of colorectal cancer.  Routine screening usually begins at age 20.  Direct examination of the colon should be repeated every 5-10 years through 69 years of age. However, you may need to be screened more often if early forms of precancerous polyps or small growths are found. Skin Cancer  Check your skin from head  to toe regularly.  Tell your health care provider about any new moles or changes in moles, especially if there is a change in a mole's shape or color.  Also tell your health care provider if you have a mole that is larger than the size of a pencil eraser.  Always use sunscreen. Apply sunscreen liberally and repeatedly throughout the day.  Protect yourself by wearing long sleeves, pants, a wide-brimmed hat, and sunglasses whenever you are outside. Heart disease, diabetes, and high blood pressure  High blood pressure causes heart disease and increases the risk of stroke. High blood pressure is more likely to develop in:  People who have blood pressure in the high end of the normal range (130-139/85-89 mm Hg).  People who are overweight or obese.  People who are African American.  If you are 69-27 years of age, have your blood pressure checked every 3-5 years. If you  are 45 years of age or older, have your blood pressure checked every year. You should have your blood pressure measured twice-once when you are at a hospital or clinic, and once when you are not at a hospital or clinic. Record the average of the two measurements. To check your blood pressure when you are not at a hospital or clinic, you can use:  An automated blood pressure machine at a pharmacy.  A home blood pressure monitor.  If you are between 75 years and 70 years old, ask your health care provider if you should take aspirin to prevent strokes.  Have regular diabetes screenings. This involves taking a blood sample to check your fasting blood sugar level.  If you are at a normal weight and have a low risk for diabetes, have this test once every three years after 69 years of age.  If you are overweight and have a high risk for diabetes, consider being tested at a younger age or more often. Preventing infection Hepatitis B  If you have a higher risk for hepatitis B, you should be screened for this virus. You are considered at high risk for hepatitis B if:  You were born in a country where hepatitis B is common. Ask your health care provider which countries are considered high risk.  Your parents were born in a high-risk country, and you have not been immunized against hepatitis B (hepatitis B vaccine).  You have HIV or AIDS.  You use needles to inject street drugs.  You live with someone who has hepatitis B.  You have had sex with someone who has hepatitis B.  You get hemodialysis treatment.  You take certain medicines for conditions, including cancer, organ transplantation, and autoimmune conditions. Hepatitis C  Blood testing is recommended for:  Everyone born from [birth year] through 1963.  Anyone with known risk factors for hepatitis C. Sexually transmitted infections (STIs)  You should be screened for sexually transmitted infections (STIs) including gonorrhea and chlamydia  if:  You are sexually active and are younger than 69 years of age.  You are older than 69 years of age and your health care provider tells you that you are at risk for this type of infection.  Your sexual activity has changed since you were last screened and you are at an increased risk for chlamydia or gonorrhea. Ask your health care provider if you are at risk.  If you do not have HIV, but are at risk, it may be recommended that you take a prescription medicine daily to prevent HIV infection. This is  called pre-exposure prophylaxis (PrEP). You are considered at risk if:  You are sexually active and do not regularly use condoms or know the HIV status of your partner(s).  You take drugs by injection.  You are sexually active with a partner who has HIV. Talk with your health care provider about whether you are at high risk of being infected with HIV. If you choose to begin PrEP, you should first be tested for HIV. You should then be tested every 3 months for as long as you are taking PrEP. Pregnancy  If you are premenopausal and you may become pregnant, ask your health care provider about preconception counseling.  If you may become pregnant, take 400 to 800 micrograms (mcg) of folic acid every day.  If you want to prevent pregnancy, talk to your health care provider about birth control (contraception). Osteoporosis and menopause  Osteoporosis is a disease in which the bones lose minerals and strength with aging. This can result in serious bone fractures. Your risk for osteoporosis can be identified using a bone density scan.  If you are 16 years of age or older, or if you are at risk for osteoporosis and fractures, ask your health care provider if you should be screened.  Ask your health care provider whether you should take a calcium or vitamin D supplement to lower your risk for osteoporosis.  Menopause may have certain physical symptoms and risks.  Hormone replacement therapy may  reduce some of these symptoms and risks. Talk to your health care provider about whether hormone replacement therapy is right for you. Follow these instructions at home:  Schedule regular health, dental, and eye exams.  Stay current with your immunizations.  Do not use any tobacco products including cigarettes, chewing tobacco, or electronic cigarettes.  If you are pregnant, do not drink alcohol.  If you are breastfeeding, limit how much and how often you drink alcohol.  Limit alcohol intake to no more than 1 drink per day for nonpregnant women. One drink equals 12 ounces of beer, 5 ounces of wine, or 1 ounces of hard liquor.  Do not use street drugs.  Do not share needles.  Ask your health care provider for help if you need support or information about quitting drugs.  Tell your health care provider if you often feel depressed.  Tell your health care provider if you have ever been abused or do not feel safe at home. This information is not intended to replace advice given to you by your health care provider. Make sure you discuss any questions you have with your health care provider. Document Released: 09/12/2010 Document Revised: 08/05/2015 Document Reviewed: 12/01/2014 Elsevier Interactive Patient Education  2017 [employer].

## 2016-05-24 NOTE — Assessment & Plan Note (Addendum)
Takes prilosec as needed, sometimes daily Encouraged weight loss

## 2016-05-24 NOTE — Assessment & Plan Note (Signed)
Uses xanax as needed for anxiety, which is not often Will continue prn xanax

## 2016-05-24 NOTE — Assessment & Plan Note (Signed)
Lab Results  Component Value Date   HGBA1C 6.1 05/04/2015   Check a1c Low sugar / carb diet Stressed regular exercise, weight loss

## 2016-05-24 NOTE — Assessment & Plan Note (Signed)
Has intermittent left sided chest pain - not necessarily related to activity Has several risk factors for heart disease Will order a stress echo - she thinks she can do the treadmill

## 2016-05-24 NOTE — Progress Notes (Signed)
Pre visit review using our clinic review tool, if applicable. No additional management support is needed unless otherwise documented below in the visit note. 

## 2016-05-24 NOTE — Assessment & Plan Note (Addendum)
Controlled Follows with Dr Hilarie Fredrickson Colonoscopy due this year

## 2016-05-24 NOTE — Assessment & Plan Note (Signed)
Not true discomfort and no pain She has a feeling of something moving No changes in bowels, no inc in gas, no diarrhea, blood in the stool Just had gyn visit and exam was normal Exam is normal Has colonoscopy this year Monitor for now - return if worsens

## 2016-05-24 NOTE — Assessment & Plan Note (Signed)
BP well controlled Current regimen effective and well tolerated Continue current medications at current doses  

## 2016-05-27 ENCOUNTER — Encounter: Payer: Self-pay | Admitting: Internal Medicine

## 2016-06-12 ENCOUNTER — Encounter (HOSPITAL_COMMUNITY): Payer: Self-pay

## 2016-06-12 ENCOUNTER — Ambulatory Visit (HOSPITAL_COMMUNITY): Payer: Medicare Other

## 2016-06-12 ENCOUNTER — Telehealth: Payer: Self-pay | Admitting: Internal Medicine

## 2016-06-12 DIAGNOSIS — R079 Chest pain, unspecified: Secondary | ICD-10-CM

## 2016-06-12 NOTE — Telephone Encounter (Signed)
Patient came in office and said she did not want to the treadmill stress test. She would like to do the other kind, nuclear stress test. She spoke with the other office about it. They informed her to come here and have you put that order in for her to be able to do that. Please advise or follow up with patient. Thank you.

## 2016-06-12 NOTE — Telephone Encounter (Signed)
Please advise 

## 2016-06-13 NOTE — Telephone Encounter (Signed)
ordered

## 2016-06-13 NOTE — Telephone Encounter (Signed)
Spoke with pt to inform.  

## 2016-06-19 ENCOUNTER — Other Ambulatory Visit: Payer: Self-pay | Admitting: Internal Medicine

## 2016-06-22 ENCOUNTER — Telehealth (HOSPITAL_COMMUNITY): Payer: Self-pay | Admitting: *Deleted

## 2016-06-22 NOTE — Telephone Encounter (Signed)
Left message on voicemail per DPR in reference to upcoming appointment scheduled on 06/26/16 with detailed instructions given per Myocardial Perfusion Study Information Sheet for the test. LM to arrive 15 minutes early, and that it is imperative to arrive on time for appointment to keep from having the test rescheduled. If you need to cancel or reschedule your appointment, please call the office within 24 hours of your appointment. Failure to do so may result in a cancellation of your appointment, and a $50 no show fee. Phone number given for call back for any questions. Kirstie Peri

## 2016-06-26 ENCOUNTER — Ambulatory Visit (HOSPITAL_COMMUNITY): Payer: Medicare Other | Attending: Cardiology

## 2016-06-26 DIAGNOSIS — I5189 Other ill-defined heart diseases: Secondary | ICD-10-CM | POA: Insufficient documentation

## 2016-06-26 DIAGNOSIS — I10 Essential (primary) hypertension: Secondary | ICD-10-CM | POA: Diagnosis not present

## 2016-06-26 DIAGNOSIS — R079 Chest pain, unspecified: Secondary | ICD-10-CM | POA: Diagnosis not present

## 2016-06-26 MED ORDER — TECHNETIUM TC 99M TETROFOSMIN IV KIT
31.3000 | PACK | Freq: Once | INTRAVENOUS | Status: AC | PRN
  Administered 2016-06-26: 31.3 via INTRAVENOUS
  Filled 2016-06-26: qty 32

## 2016-06-26 MED ORDER — REGADENOSON 0.4 MG/5ML IV SOLN
0.4000 mg | Freq: Once | INTRAVENOUS | Status: AC
  Administered 2016-06-26: 0.4 mg via INTRAVENOUS

## 2016-06-27 ENCOUNTER — Ambulatory Visit (HOSPITAL_COMMUNITY): Payer: Medicare Other | Attending: Cardiovascular Disease

## 2016-06-27 LAB — MYOCARDIAL PERFUSION IMAGING
CHL CUP NUCLEAR SDS: 13
CHL CUP NUCLEAR SRS: 7
CSEPPHR: 96 {beats}/min
LV dias vol: 68 mL (ref 46–106)
LV sys vol: 18 mL
NUC STRESS TID: 0.82
RATE: 0.33
Rest HR: 76 {beats}/min
SSS: 20

## 2016-06-27 MED ORDER — TECHNETIUM TC 99M TETROFOSMIN IV KIT
31.7000 | PACK | Freq: Once | INTRAVENOUS | Status: AC | PRN
  Administered 2016-06-27: 31.7 via INTRAVENOUS
  Filled 2016-06-27: qty 32

## 2016-07-02 ENCOUNTER — Encounter: Payer: Self-pay | Admitting: Internal Medicine

## 2016-07-03 ENCOUNTER — Other Ambulatory Visit: Payer: Self-pay | Admitting: Internal Medicine

## 2016-08-04 ENCOUNTER — Encounter: Payer: Self-pay | Admitting: Internal Medicine

## 2016-08-15 ENCOUNTER — Other Ambulatory Visit: Payer: Self-pay | Admitting: Internal Medicine

## 2016-08-17 ENCOUNTER — Encounter: Payer: Self-pay | Admitting: Internal Medicine

## 2016-08-17 ENCOUNTER — Encounter: Payer: Self-pay | Admitting: Physician Assistant

## 2016-08-17 ENCOUNTER — Ambulatory Visit (INDEPENDENT_AMBULATORY_CARE_PROVIDER_SITE_OTHER): Payer: Medicare Other | Admitting: Physician Assistant

## 2016-08-17 VITALS — BP 122/70 | HR 60 | Ht 65.0 in | Wt 215.0 lb

## 2016-08-17 DIAGNOSIS — K5010 Crohn's disease of large intestine without complications: Secondary | ICD-10-CM

## 2016-08-17 DIAGNOSIS — K219 Gastro-esophageal reflux disease without esophagitis: Secondary | ICD-10-CM | POA: Diagnosis not present

## 2016-08-17 DIAGNOSIS — Z86010 Personal history of colonic polyps: Secondary | ICD-10-CM | POA: Diagnosis not present

## 2016-08-17 MED ORDER — NA SULFATE-K SULFATE-MG SULF 17.5-3.13-1.6 GM/177ML PO SOLN: 1.0000 | ORAL | 0 refills | Status: DC

## 2016-08-17 NOTE — Patient Instructions (Signed)
You have been scheduled for a colonoscopy. Please follow written instructions given to you at your visit today.  Please pick up your prep supplies at the pharmacy within the next 1-3 days. If you use inhalers (even only as needed), please bring them with you on the day of your procedure. Your physician has requested that you go to www.startemmi.com and enter the access code given to you at your visit today. This web site gives a general overview about your procedure. However, you should still follow specific instructions given to you by our office regarding your preparation for the procedure.   Continue Lialda 4.8 grams daily.

## 2016-08-17 NOTE — Progress Notes (Addendum)
Chief Complaint: Abdominal Pain  HPI:  Kayla Maddox is a 71 year old female with a past medical history of Crohn's colitis, GERD and multiple others listed below,  who presents to clinic today for a complaint of abdominal pain .     Patient typically follows with Dr. Hilarie Fredrickson and was last seen for an office visit on 12/13/15. At that time, patient was doing well after an increase of Lialda to 4.8 g daily. She describes 2 bowel movements a day and that the abdominal pressure and bloating she was having had resolved. It was recommended she have a surveillance colonoscopy in June 2018 and continue Lialda 4.8 g. She also continued on Omeprazole 40 mg daily for GERD with a history of gastritis. Last colonoscopy was performed 08/18/13 with a finding of segmental colitis at the hepatic flexure and in the proximal transverse colon, 2 sessile polyps in the ascending colon, 5 sessile polyps in distal transverse colon, descending colon and sigmoid colon 6 sessile polyps in the rectosigmoid colon as well as mild diverticulosis. Recommend she have repeat in 3 years.   Today, the patient tells me that she has been doing very well. She continues to have 2-3 formed bowel movements per day with no associated blood or abdominal pain. She does describe a unique feeling in the right side of her abdomen. She tells me that over the past 2 months she feels as though there is "a baby in there". She tells me that she can visibly see on the right side of her abdomen, a linear bump, "flip-flop" further to the right side of her abdomen. She tells me that she can actually "see my shirt move" when this happens. The patient tells me this is not uncomfortable but more of a strange sensation. She tells me that it can happen any time of day and often happens multiple times a day. At first she just noticed this in her recliner when laying back but now can see it even when she is sitting.   Patient's reflux remains well controlled on 40 mg  Omeprazole daily.   Patient denies fever, chills, blood in her stool, melena, weight loss, fatigue, anorexia, nausea, vomiting, heartburn or reflux.  Past Medical History:  Diagnosis Date  . Allergic rhinitis   . Anxiety   . Chronic colitis   . Crohn's colitis ([place])   . Diverticulosis   . Fundic gland polyps of stomach, benign   . Gallbladder polyp 2017  . Gastric heterotopia   . GERD (gastroesophageal reflux disease)   . Hyperlipidemia   . Hyperplastic colon polyp   . Hypertension   . Obesity   . Sleep apnea   . Tubular adenoma of colon     Past Surgical History:  Procedure Laterality Date  . BLADDER SUSPENSION  2011  . FOOT SURGERY Left 2005  . NASAL SINUS SURGERY  2012  . SPINE SURGERY  1994    Current Outpatient Prescriptions  Medication Sig Dispense Refill  . ALPRAZolam (XANAX) 0.25 MG tablet Take 1 tablet (0.25 mg total) by mouth daily as needed for anxiety. 30 tablet 0  . benzonatate (TESSALON) 200 MG capsule Take 1 capsule (200 mg total) by mouth 3 (three) times daily as needed for cough. 30 capsule 1  . fluticasone (FLONASE) 50 MCG/ACT nasal spray INHALE 2 SPRAYS IN EACH NOSTRIL EVERY DAY  11  . hydrochlorothiazide (HYDRODIURIL) 25 MG tablet TAKE ONE TABLET BY MOUTH DAILY 30 tablet 5  . mesalamine (LIALDA) 1.2 g EC  tablet TAKE 4 TABLETS EVERY DAY WITH BREAKFAST 360 tablet 2  . omeprazole (PRILOSEC) 40 MG capsule Take 1 capsule (40 mg total) by mouth daily. 90 capsule 2   No current facility-administered medications for this visit.     Allergies as of 08/17/2016 - Review Complete 08/17/2016  Allergen Reaction Noted  . Amoxicillin Rash   . Clarithromycin Rash   . Doxycycline Rash 12/30/2012  . Prednisone Rash 12/30/2012  . Sulfonamide derivatives Rash     Family History  Problem Relation Age of Onset  . Diabetes Father   . Hypertension Father   . Heart disease Father   . Hypertension Mother   . Heart disease Mother   . Colon cancer Neg Hx   .  Esophageal cancer Neg Hx   . Rectal cancer Neg Hx   . Stomach cancer Neg Hx     Social History   Social History  . Marital status: Married    Spouse name: N/A  . Number of children: 1  . 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 Smoking status: Former Smoker    Packs/day: 1.00    Years: 20.00    Types: Cigarettes    Quit date: 03/13/1981  . Smokeless tobacco: Never Used  . Alcohol use No  . Drug use: No  . Sexual activity: Not on file   Other Topics Concern  . Not on file   Social History Narrative   No exercise      Retiring in June 2017    Review of Systems:    Constitutional: No weight loss, fever or chills Cardiovascular: No chest pain Respiratory: No SOB Gastrointestinal: See HPI and otherwise negative   Physical Exam:  Vital signs: BP 122/70   Pulse 60   Ht 5' 5"  (1.651 m)   Wt 215 lb (97.5 kg)   BMI 35.78 kg/m    Constitutional:   Pleasant overweight Caucasian female appears to be in NAD, Well developed, Well nourished, alert and cooperative Respiratory: Respirations even and unlabored. Lungs clear to auscultation bilaterally.   No wheezes, crackles, or rhonchi.  Cardiovascular: Normal S1, S2. No MRG. Regular rate and rhythm. No peripheral edema, cyanosis or pallor.  Gastrointestinal:  Soft, nondistended, nontender. No rebound or guarding. Normal bowel sounds. No appreciable masses or hepatomegaly. Psychiatric:  Demonstrates good judgement and reason without abnormal affect or behaviors.  MOST RECENT LABS AND IMAGING: CBC    Component Value Date/Time   WBC 6.6 05/24/2016 0953   RBC 5.21 (H) 05/24/2016 0953   HGB 15.3 (H) 05/24/2016 0953   HCT 45.4 05/24/2016 0953   PLT 246.0 05/24/2016 0953   MCV 87.2 05/24/2016 0953   MCHC 33.7 05/24/2016 0953   RDW 13.1 05/24/2016 0953   LYMPHSABS 2.1 05/24/2016 0953   MONOABS 0.4 05/24/2016 0953   EOSABS 0.1 05/24/2016 0953   BASOSABS 0.0  05/24/2016 0953    CMP     Component Value Date/Time   NA 142 05/24/2016 0953   K 3.8 05/24/2016 0953   CL 102 05/24/2016 0953   CO2 33 (H) 05/24/2016 0953   GLUCOSE 102 (H) 05/24/2016 0953   BUN 15 05/24/2016 0953   CREATININE 0.74 05/24/2016 0953   CALCIUM 9.6 05/24/2016 0953   PROT 7.2 05/24/2016 0953   ALBUMIN 4.2 05/24/2016 0953   AST 17 05/24/2016 0953   ALT 16 05/24/2016 0953   ALKPHOS 59 05/24/2016 0953  BILITOT 0.5 05/24/2016 0953    Assessment: 1. Crohn's colitis (hepatic flexure and transverse colon): In clinical remission, on Lialda 4.8 g daily, due for colonoscopy for surveillance 2. History of adenomatous polyps last colonoscopy 3 years ago, repeat is due now 3. GERD: Controlled on Omeprazole 40 mg daily 4. Abnormal abdominal sensation: Patient describes feeling as though a part of her stomach is "flip flopping", on the right side of her abdomen she feels and sees a linear bump move over further to the right side of her abdomen, various times throughout the day; consider possible gas versus feces passing through this area versus other  Plan: 1. Scheduled her for surveillance colonoscopy in the [place] with Dr. Hilarie Fredrickson. Did discuss risks, benefits, limitations and alternatives and the patient agrees to proceed. 2. Discussed patient's abnormal abdominal sensation. Likely this is gas or feces passing through this area. No pain is associated and she has had no change in bowel habits. 3. Continue Omeprazole 40 mg daily 4. Continue Lialda 4.8 g daily 5. Patient to return to clinic per Dr. Vena Rua recommendations after time of procedure.  Ellouise Newer, PA-C [facility] 08/17/2016, 11:06 AM  Cc: Binnie Rail, MD   Addendum: Reviewed and agree with management. Pyrtle, Lajuan Lines, MD

## 2016-08-22 ENCOUNTER — Ambulatory Visit (AMBULATORY_SURGERY_CENTER): Payer: Medicare Other | Admitting: Internal Medicine

## 2016-08-22 ENCOUNTER — Encounter: Payer: Self-pay | Admitting: Internal Medicine

## 2016-08-22 ENCOUNTER — Other Ambulatory Visit: Payer: Self-pay | Admitting: *Deleted

## 2016-08-22 VITALS — BP 106/55 | HR 66 | Temp 98.4°F | Resp 13 | Ht 65.0 in | Wt 215.0 lb

## 2016-08-22 DIAGNOSIS — D120 Benign neoplasm of cecum: Secondary | ICD-10-CM | POA: Diagnosis not present

## 2016-08-22 DIAGNOSIS — K635 Polyp of colon: Secondary | ICD-10-CM | POA: Diagnosis not present

## 2016-08-22 DIAGNOSIS — K5010 Crohn's disease of large intestine without complications: Secondary | ICD-10-CM | POA: Diagnosis not present

## 2016-08-22 DIAGNOSIS — D125 Benign neoplasm of sigmoid colon: Secondary | ICD-10-CM

## 2016-08-22 DIAGNOSIS — D123 Benign neoplasm of transverse colon: Secondary | ICD-10-CM

## 2016-08-22 DIAGNOSIS — D122 Benign neoplasm of ascending colon: Secondary | ICD-10-CM

## 2016-08-22 DIAGNOSIS — Z86010 Personal history of colonic polyps: Secondary | ICD-10-CM

## 2016-08-22 HISTORY — PX: COLONOSCOPY: SHX174

## 2016-08-22 MED ORDER — SODIUM CHLORIDE 0.9 % IV SOLN: 500.0000 mL | INTRAVENOUS | Status: DC

## 2016-08-22 MED ORDER — MESALAMINE 1.2 G PO TBEC: DELAYED_RELEASE_TABLET | ORAL | 3 refills | Status: DC

## 2016-08-22 NOTE — Progress Notes (Signed)
No egg or soy allergy known to patient  No issues with past sedation with any surgeries  or procedures, no intubation problems  No diet pills per patient No home 02 use per patient  No blood thinners per patient  Pt denies issues with constipation  No A fib or A flutter

## 2016-08-22 NOTE — Patient Instructions (Addendum)
YOU HAD AN ENDOSCOPIC PROCEDURE TODAY AT [facility]:   Refer to the procedure report that was given to you for any specific questions about what was found during the examination.  If the procedure report does not answer your questions, please call your gastroenterologist to clarify.  If you requested that your care partner not be given the details of your procedure findings, then the procedure report has been included in a sealed envelope for you to review at your convenience later.  YOU SHOULD EXPECT: Some feelings of bloating in the abdomen. Passage of more gas than usual.  Walking can help get rid of the air that was put into your GI tract during the procedure and reduce the bloating. If you had a lower endoscopy (such as a colonoscopy or flexible sigmoidoscopy) you may notice spotting of blood in your stool or on the toilet paper. If you underwent a bowel prep for your procedure, you may not have a normal bowel movement for a few days.  Please Note:  You might notice some irritation and congestion in your nose or some drainage.  This is from the oxygen used during your procedure.  There is no need for concern and it should clear up in a day or so.  SYMPTOMS TO REPORT IMMEDIATELY:   Following lower endoscopy (colonoscopy or flexible sigmoidoscopy):  Excessive amounts of blood in the stool  Significant tenderness or worsening of abdominal pains  Swelling of the abdomen that is new, acute  Fever of 100F or higher   For urgent or emergent issues, a gastroenterologist can be reached at any hour by calling [phone].   DIET:  We do recommend a small meal at first, but then you may proceed to your regular diet.  Drink plenty of fluids but you should avoid alcoholic beverages for 24 hours.  ACTIVITY:  You should plan to take it easy for the rest of today and you should NOT DRIVE or use heavy machinery until tomorrow (because of the sedation medicines used during the test).     FOLLOW UP: Our staff will call the number listed on your records the next business day following your procedure to check on you and address any questions or concerns that you may have regarding the information given to you following your procedure. If we do not reach you, we will leave a message.  However, if you are feeling well and you are not experiencing any problems, there is no need to return our call.  We will assume that you have returned to your regular daily activities without incident.  If any biopsies were taken you will be contacted by phone or by letter within the next 1-3 weeks.  Please call us at [phone] if you have not heard about the biopsies in 3 weeks.   Await for biopsy results to determine next repeat Colonoscopy screening Polyps (handout given) Hemorrhoids (handout given) Diverticulosis (handout given) Continue Lialda 4.8 gram daily Recall for Colonoscopy in 1 year and office visit in 1 year   SIGNATURES/CONFIDENTIALITY: You and/or your care partner have signed paperwork which will be entered into your electronic medical record.  These signatures attest to the fact that that the information above on your After Visit Summary has been reviewed and is understood.  Full responsibility of the confidentiality of this discharge information lies with you and/or your care-partner.

## 2016-08-22 NOTE — Progress Notes (Signed)
Spontaneous respirations throughout. VSS. Resting comfortably. To PACU on room air. Report to  [facility].

## 2016-08-22 NOTE — Op Note (Addendum)
[facility] Patient Name: Kayla Maddox Procedure Date: 08/22/2016 2:33 PM MRN: [id number] Endoscopist: Jerene Bears , MD Age: 71 Referring MD:  Date of Birth: 26-Aug-1945 Gender: Female Account #: [id number] Procedure:                Colonoscopy Indications:              Surveillance: Personal history of adenomatous                            polyps on last colonoscopy 3 years ago, history of                            segmental Crohn's colitis Medicines:                Monitored Anesthesia Care Procedure:                Pre-Anesthesia Assessment:                           - Prior to the procedure, a History and Physical                            was performed, and patient medications and                            allergies were reviewed. The patient's tolerance of                            previous anesthesia was also reviewed. The risks                            and benefits of the procedure and the sedation                            options and risks were discussed with the patient.                            All questions were answered, and informed consent                            was obtained. Prior Anticoagulants: The patient has                            taken no previous anticoagulant or antiplatelet                            agents. ASA Grade Assessment: II - A patient with                            mild systemic disease. After reviewing the risks                            and benefits, the patient was deemed in  satisfactory condition to undergo the procedure.                           After obtaining informed consent, the colonoscope                            was passed under direct vision. Throughout the                            procedure, the patient's blood pressure, pulse, and                            oxygen saturations were monitored continuously. The                            Colonoscope was introduced through the  anus and                            advanced to the the cecum, identified by                            appendiceal orifice and ileocecal valve. The                            colonoscopy was performed without difficulty. The                            patient tolerated the procedure well. The quality                            of the bowel preparation was good. The ileocecal                            valve, appendiceal orifice, and rectum were                            photographed. Scope In: 2:42:57 PM Scope Out: 3:13:03 PM Scope Withdrawal Time: 0 hours 25 minutes 16 seconds  Total Procedure Duration: 0 hours 30 minutes 6 seconds  Findings:                 The digital rectal exam was normal.                           A 6 mm polyp was found in the cecum. The polyp was                            sessile. The polyp was removed with a cold snare.                            Resection and retrieval were complete.                           Three sessile polyps were found in the ascending  colon. The polyps were 4 to 7 mm in size. These                            polyps were removed with a cold snare. Resection                            and retrieval were complete.                           Four sessile polyps were found in the transverse                            colon. The polyps were 3 to 7 mm in size. These                            polyps were removed with a cold snare. Resection                            and retrieval were complete.                           A 5 mm polyp was found in the sigmoid colon. The                            polyp was sessile. The polyp was removed with a                            cold snare. Resection and retrieval were complete.                           Multiple small and large-mouthed diverticula were                            found in the sigmoid colon and descending colon.                           Internal hemorrhoids  were found during                            retroflexion. The hemorrhoids were small.                           There is no endoscopic evidence of colitis in the                            entire colon. Complications:            No immediate complications. Estimated Blood Loss:     Estimated blood loss was minimal. Impression:               - One 6 mm polyp in the cecum, removed with a cold                            snare. Resected  and retrieved.                           - Three 4 to 7 mm polyps in the ascending colon,                            removed with a cold snare. Resected and retrieved.                           - Four 3 to 7 mm polyps in the transverse colon,                            removed with a cold snare. Resected and retrieved.                           - One 5 mm polyp in the sigmoid colon, removed with                            a cold snare. Resected and retrieved.                           - Moderate diverticulosis in the sigmoid colon and                            in the descending colon.                           - Internal hemorrhoids.                           - Crohn's colitis in endoscopic remission. Recommendation:           - Patient has a contact number available for                            emergencies. The signs and symptoms of potential                            delayed complications were discussed with the                            patient. Return to normal activities tomorrow.                            Written discharge instructions were provided to the                            patient.                           - Resume previous diet.                           - Continue present medications including Lialda 4.8  g daily.                           - Await pathology results.                           - Repeat colonoscopy is recommended for                            surveillance. The colonoscopy date will be                             determined after pathology results from today's                            exam become available for review. Jerene Bears, MD 08/22/2016 3:19:02 PM This report has been signed electronically.

## 2016-08-22 NOTE — Progress Notes (Signed)
Called to room to assist during endoscopic procedure.  Patient ID and intended procedure confirmed with present staff. Received instructions for my participation in the procedure from the performing physician.  

## 2016-08-23 ENCOUNTER — Telehealth: Payer: Self-pay

## 2016-08-23 NOTE — Telephone Encounter (Signed)
  Follow up Call-  Call back number 08/22/2016  Post procedure Call Back phone  # [phone]  Permission to leave phone message Yes  Some recent data might be hidden     Patient questions:  Do you have a fever, pain , or abdominal swelling? No. Pain Score  0 *  Have you tolerated food without any problems? Yes.    Have you been able to return to your normal activities? Yes.    Do you have any questions about your discharge instructions: Diet   No. Medications  No. Follow up visit  No.  Do you have questions or concerns about your Care? No.  Actions: * If pain score is 4 or above: No action needed, pain <4.

## 2016-08-29 ENCOUNTER — Encounter: Payer: Self-pay | Admitting: Internal Medicine

## 2016-11-23 NOTE — Progress Notes (Signed)
Subjective:   Kayla Maddox is a 71 y.o. female who presents for Medicare Annual (Subsequent) preventive examination.  Review of Systems:  No ROS.  Medicare Wellness Visit. Additional risk factors are reflected in the social history.  Cardiac Risk Factors include: advanced age (>17mn, >>62women);dyslipidemia;hypertension;obesity (BMI >30kg/m2)  Sleep patterns: feels rested on waking, gets up 1-2 times nightly to void and sleeps 6-8 hours nightly.    Home Safety/Smoke Alarms: Feels safe in home. Smoke alarms in place.  Living environment; residence and Firearm Safety: 1-story house/ trailer, no firearms. Lives with family, no needs for DME, good support system Seat Belt Safety/Bike Helmet: Wears seat belt.     Objective:     Vitals: BP 127/68   Pulse 85   Temp 98.6 F (37 C)   Resp 20   Ht 5' 5"  (1.651 m)   Wt 225 lb (102.1 kg)   SpO2 98%   BMI 37.44 kg/m   Body mass index is 37.44 kg/m.   Tobacco History  Smoking Status  . Former Smoker  . Packs/day: 1.00  . Years: 20.00  . Types: Cigarettes  . Quit date: 03/13/1981  Smokeless Tobacco  . Never Used     Counseling given: Not Answered   Past Medical History:  Diagnosis Date  . Allergic rhinitis   . Allergy   . Anxiety   . Arthritis   . Cataract    bilat removed   . Chronic colitis   . [place]   . Diverticulosis   . Fundic gland polyps of stomach, benign   . Gallbladder polyp 2017  . Gastric heterotopia   . GERD (gastroesophageal reflux disease)   . Hyperlipidemia   . Hyperplastic colon polyp   . Hypertension   . Obesity   . Sleep apnea   . Tubular adenoma of colon    Past Surgical History:  Procedure Laterality Date  . BLADDER SUSPENSION  2011  . COLONOSCOPY    . FOOT SURGERY Left 2005  . NASAL SINUS SURGERY  2012  . POLYPECTOMY    . [place] . UPPER GASTROINTESTINAL ENDOSCOPY     Family History  Problem Relation Age of Onset  . Diabetes Father   . Hypertension  Father   . Heart disease Father   . Hypertension Mother   . Heart disease Mother   . Colon cancer Neg Hx   . Esophageal cancer Neg Hx   . Rectal cancer Neg Hx   . Stomach cancer Neg Hx   . Colon polyps Neg Hx    History  Sexual Activity  . Sexual activity: Not on file    Outpatient Encounter Prescriptions as of 11/27/2016  Medication Sig  . ALPRAZolam (XANAX) 0.25 MG tablet Take 1 tablet (0.25 mg total) by mouth daily as needed for anxiety.  . benzonatate (TESSALON) 200 MG capsule Take 1 capsule (200 mg total) by mouth 3 (three) times daily as needed for cough.  . fluticasone (FLONASE) 50 MCG/ACT nasal spray INHALE 2 SPRAYS IN EACH NOSTRIL EVERY DAY  . hydrochlorothiazide (HYDRODIURIL) 25 MG tablet TAKE ONE TABLET BY MOUTH DAILY  . mesalamine (LIALDA) 1.2 g EC tablet TAKE 4 TABLETS EVERY DAY WITH BREAKFAST  . omeprazole (PRILOSEC) 40 MG capsule Take 1 capsule (40 mg total) by mouth daily. (Patient taking differently: Take 40 mg by mouth as needed. )  . [DISCONTINUED] 0.9 %  sodium chloride infusion    No facility-administered encounter medications on  file as of 11/27/2016.     Activities of Daily Living In your present state of health, do you have any difficulty performing the following activities: 11/27/2016  Hearing? N  Vision? N  Difficulty concentrating or making decisions? N  Walking or climbing stairs? N  Dressing or bathing? N  Doing errands, shopping? N  Preparing Food and eating ? N  Using the Toilet? N  In the past six months, have you accidently leaked urine? Y  Do you have problems with loss of bowel control? N  Managing your Medications? N  Managing your Finances? N  Housekeeping or managing your Housekeeping? N  Some recent data might be hidden    Patient Care Team: Binnie Rail, MD as PCP - General (Internal Medicine) Pyrtle, Lajuan Lines, MD as Consulting Physician (Gastroenterology) Ernestine Conrad, MD as Referring Physician (Otolaryngology) Servando Salina, MD as Consulting Physician (Obstetrics and Gynecology) Bjorn Loser, MD as Consulting Physician (Urology)    Assessment:    Physical assessment deferred to PCP.  Exercise Activities and Dietary recommendations Current Exercise Habits: Structured exercise class, Time (Minutes): 25, Frequency (Times/Week): 2, Weekly Exercise (Minutes/Week): 50, Intensity: Mild, Exercise limited by: orthopedic condition(s) (stationary bike)  Diet (meal preparation, eat out, water intake, caffeinated beverages, dairy products, fruits and vegetables): in general, a "healthy" diet  , well balanced   Reviewed heart healthy, discussed portion control, and weight loss tips.  Goals    . Take 30 minutes in the afternoon just for myself          To enjoy quite, peace and time just for myself.      Fall Risk Fall Risk  11/27/2016 04/14/2016 10/28/2013  Falls in the past year? No No No  Risk for fall due to : Impaired balance/gait - -   Depression Screen PHQ 2/9 Scores 11/27/2016 04/14/2016 10/28/2013  PHQ - 2 Score 2 0 0  PHQ- 9 Score 6 - -     Cognitive Function MMSE - Mini Mental State Exam 11/27/2016  Orientation to time 5  Orientation to Place 5  Registration 3  Attention/ Calculation 5  Recall 3  Language- name 2 objects 2  Language- repeat 1  Language- follow 3 step command 3  Language- read & follow direction 1  Write a sentence 1  Copy design 1  Total score 30        Immunization History  Administered Date(s) Administered  . Influenza Split 12/11/2012  . Influenza Whole 01/17/2010  . Influenza,inj,Quad PF,6+ Mos 01/10/2014  . Influenza-Unspecified 12/12/2014  . Pneumococcal Conjugate-13 10/28/2013  . Pneumococcal Polysaccharide-23 01/11/2010, 05/04/2015  . Tdap 10/28/2013  . Zoster 03/14/2011   Screening Tests Health Maintenance  Topic Date Due  . INFLUENZA VACCINE  10/11/2016  . MAMMOGRAM  03/20/2017  . COLONOSCOPY  08/23/2019  . TETANUS/TDAP  10/29/2023  . DEXA  SCAN  Completed  . Hepatitis C Screening  Completed  . PNA vac Low Risk Adult  Completed      Plan:    Continue doing brain stimulating activities (puzzles, reading, adult coloring books, staying active) to keep memory sharp.   Continue to eat heart healthy diet (full of fruits, vegetables, whole grains, lean protein, water--limit salt, fat, and sugar intake) and increase physical activity as tolerated.  I have personally reviewed and noted the following in the patient's chart:   . Medical and social history . Use of alcohol, tobacco or illicit drugs  . Current medications and supplements . Functional  ability and status . Nutritional status . Physical activity . Advanced directives . List of other physicians . Vitals . Screenings to include cognitive, depression, and falls . Referrals and appointments  In addition, I have reviewed and discussed with patient certain preventive protocols, quality metrics, and best practice recommendations. A written personalized care plan for preventive services as well as general preventive health recommendations were provided to patient.     Michiel Cowboy, RN  11/27/2016   Medical screening examination/treatment/procedure(s) were performed by non-physician practitioner and as supervising physician I was immediately available for consultation/collaboration. I agree with above. Binnie Rail, MD

## 2016-11-23 NOTE — Progress Notes (Signed)
Pre visit review using our clinic review tool, if applicable. No additional management support is needed unless otherwise documented below in the visit note. 

## 2016-11-24 ENCOUNTER — Ambulatory Visit: Payer: Medicare Other | Admitting: Internal Medicine

## 2016-11-24 ENCOUNTER — Ambulatory Visit: Payer: Medicare Other

## 2016-11-26 DIAGNOSIS — E538 Deficiency of other specified B group vitamins: Secondary | ICD-10-CM | POA: Insufficient documentation

## 2016-11-26 NOTE — Progress Notes (Signed)
Subjective:    Patient ID: Kayla Maddox, female    DOB: December 13, 1945, 71 y.o.   MRN: [id number]  HPI The patient is here for follow up.  Hypertension: She is taking her medication daily. She is compliant with a low sodium diet.  She does have intermittent chest pain, but this has been evaluated and she had a negative stress test. She also gets frequent headaches recently. She denies palpitations, edema, shortness of breath and lightheadedness. She is exercising regularly, but minimally- at most twice a week.      GERD:  She is not taking her medication daily as prescribed.  She denies any GERD symptoms and feels her GERD is well controlled without medication.   Prediabetes:  She is not compliant with a low sugar/carbohydrate diet.  She is exercising minimally.  She has gained weight.    Hyperlipidemia: She is not on any medication. She is not always compliant with a low fat/cholesterol diet. She is exercising some.   B12 deficiency: She is taking her B12 daily as prescribed.  Anxiety:  She has increased anxiety.  She now uses xanax only as needed. She has daily anxiety.  She was on Xanax multiple times a day a few years ago and wondered if it could be increased.  She does have some depression. She has never been on a daily antianxiety or antidepressant medication  Urinary frequency, urgency:  She urinates 4 times before lunch and 4 times between lunch and dinner.  She has stress and urge incontinence.  She did do pelvic PT in the past.  She had a mini sling inserted in the past.   Cough:  She has multiple triggers for her cough -him to be postnasal drip, food, etc.  She has seen allergy, GI and Pulmonary in the past.  She uses the tessalon perles as needed.  She is not taking the prilosec daily.    Headaches, ? Sinus infection: she has had a lot of headaches and wonders if it is stress.  She has had intermittent flushing under her eyes for the past two weeks.  She has nasal congestion at  times - difficulty breathing at times.  She is not able to get any mucus out when blowing.  She has some sinus pressure.   She does have a history of sinus infections. She has not had any chills recently.  Medications and allergies reviewed with patient and updated if appropriate.  Patient Active Problem List   Diagnosis Date Noted  . B12 deficiency 11/26/2016  . Chest pain 05/24/2016  . RLQ discomfort 05/24/2016  . Prediabetes 05/06/2015  . Fatty liver 05/04/2015  . Sensation of feeling hot 02/16/2015  . Obesity 11/13/2014  . Dizziness 06/12/2014  . Flushing 04/29/2014  . Hematuria, gross 04/16/2014  . Back pain with sciatica 11/06/2013  . Anxiety state 10/28/2013  . Crohn's colitis ([place]) 10/21/2013  . Adenomatous colon polyp 10/21/2013  . Gastroesophageal reflux disease without esophagitis 10/21/2013  . Hyperlipidemia 02/10/2010  . Essential hypertension 02/10/2010  . Asthma 02/10/2010  . OSA (obstructive sleep apnea) 02/10/2010  . DYSPNEA 02/10/2010  . Upper airway cough syndrome 02/09/2010    Current Outpatient Prescriptions on File Prior to Visit  Medication Sig Dispense Refill  . ALPRAZolam (XANAX) 0.25 MG tablet Take 1 tablet (0.25 mg total) by mouth daily as needed for anxiety. 30 tablet 0  . benzonatate (TESSALON) 200 MG capsule Take 1 capsule (200 mg total) by mouth 3 (three) times daily  as needed for cough. 30 capsule 1  . fluticasone (FLONASE) 50 MCG/ACT nasal spray INHALE 2 SPRAYS IN EACH NOSTRIL EVERY DAY  11  . hydrochlorothiazide (HYDRODIURIL) 25 MG tablet TAKE ONE TABLET BY MOUTH DAILY 30 tablet 5  . mesalamine (LIALDA) 1.2 g EC tablet TAKE 4 TABLETS EVERY DAY WITH BREAKFAST 360 tablet 3  . omeprazole (PRILOSEC) 40 MG capsule Take 1 capsule (40 mg total) by mouth daily. (Patient taking differently: Take 40 mg by mouth as needed. ) 90 capsule 2   No current facility-administered medications on file prior to visit.     Past Medical History:  Diagnosis Date  .  Allergic rhinitis   . Allergy   . Anxiety   . Arthritis   . Cataract    bilat removed   . Chronic colitis   . Crohn's colitis ([place])   . Diverticulosis   . Fundic gland polyps of stomach, benign   . Gallbladder polyp 2017  . Gastric heterotopia   . GERD (gastroesophageal reflux disease)   . Hyperlipidemia   . Hyperplastic colon polyp   . Hypertension   . Obesity   . Sleep apnea   . Tubular adenoma of colon     Past Surgical History:  Procedure Laterality Date  . BLADDER SUSPENSION  2011  . COLONOSCOPY    . FOOT SURGERY Left 2005  . NASAL SINUS SURGERY  2012  . POLYPECTOMY    . [place]  . UPPER GASTROINTESTINAL ENDOSCOPY      Social History   Social History  . Marital status: Married    Spouse name: N/A  . Number of children: 1  . 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 Smoking status: Former Smoker    Packs/day: 1.00    Years: 20.00    Types: Cigarettes    Quit date: 03/13/1981  . Smokeless tobacco: Never Used  . Alcohol use No  . Drug use: No  . Sexual activity: Not Asked   Other Topics Concern  . None   Social History Narrative   No exercise      Retiring in June 2017    Family History  Problem Relation Age of Onset  . Diabetes Father   . Hypertension Father   . Heart disease Father   . Hypertension Mother   . Heart disease Mother   . Colon cancer Neg Hx   . Esophageal cancer Neg Hx   . Rectal cancer Neg Hx   . Stomach cancer Neg Hx   . Colon polyps Neg Hx     Review of Systems  Constitutional: Negative for chills and fever.  HENT: Positive for congestion, postnasal drip (chronic) and sinus pressure (intermittent). Negative for ear pain and sore throat.   Respiratory: Positive for cough. Negative for shortness of breath and wheezing.   Cardiovascular: Positive for chest pain (occ). Negative for palpitations and leg swelling.  Neurological: Positive for  headaches. Negative for dizziness and light-headedness.       Objective:   Vitals:   11/27/16 1517  BP: 127/68  Pulse: 85  Resp: 20  Temp: 98.6 F (37 C)  SpO2: 98%   Wt Readings from Last 3 Encounters:  11/27/16 225 lb (102.1 kg)  08/22/16 215 lb (97.5 kg)  08/17/16 215 lb (97.5 kg)   Body mass index is 37.44 kg/m.   Physical Exam    GENERAL  APPEARANCE: Appears stated age, well appearing, NAD EYES: conjunctiva clear, no icterus HEENT: bilateral tympanic membranes and ear canals normal, oropharynx with no erythema, no thyromegaly, trachea midline, no cervical or supraclavicular lymphadenopathy LUNGS: Clear to auscultation without wheeze or crackles, unlabored breathing, good air entry bilaterally HEART/CVS: Normal S1,S2 without murmurs. No edema SKIN; warm, dry PSYCH: Mildly anxious mood and affect      Assessment & Plan:    See Problem List for Assessment and Plan of chronic medical problems.

## 2016-11-27 ENCOUNTER — Encounter: Payer: Self-pay | Admitting: Internal Medicine

## 2016-11-27 ENCOUNTER — Ambulatory Visit (INDEPENDENT_AMBULATORY_CARE_PROVIDER_SITE_OTHER): Payer: Medicare Other | Admitting: Internal Medicine

## 2016-11-27 ENCOUNTER — Other Ambulatory Visit (INDEPENDENT_AMBULATORY_CARE_PROVIDER_SITE_OTHER): Payer: Medicare Other

## 2016-11-27 VITALS — BP 127/68 | HR 85 | Temp 98.6°F | Resp 20 | Ht 65.0 in | Wt 225.0 lb

## 2016-11-27 DIAGNOSIS — I10 Essential (primary) hypertension: Secondary | ICD-10-CM

## 2016-11-27 DIAGNOSIS — R7303 Prediabetes: Secondary | ICD-10-CM

## 2016-11-27 DIAGNOSIS — J0100 Acute maxillary sinusitis, unspecified: Secondary | ICD-10-CM

## 2016-11-27 DIAGNOSIS — K219 Gastro-esophageal reflux disease without esophagitis: Secondary | ICD-10-CM | POA: Diagnosis not present

## 2016-11-27 DIAGNOSIS — J329 Chronic sinusitis, unspecified: Secondary | ICD-10-CM | POA: Insufficient documentation

## 2016-11-27 DIAGNOSIS — E538 Deficiency of other specified B group vitamins: Secondary | ICD-10-CM

## 2016-11-27 DIAGNOSIS — J019 Acute sinusitis, unspecified: Secondary | ICD-10-CM | POA: Insufficient documentation

## 2016-11-27 DIAGNOSIS — F411 Generalized anxiety disorder: Secondary | ICD-10-CM | POA: Diagnosis not present

## 2016-11-27 DIAGNOSIS — Z23 Encounter for immunization: Secondary | ICD-10-CM

## 2016-11-27 LAB — COMPREHENSIVE METABOLIC PANEL
ALT: 18 U/L (ref 0–35)
AST: 16 U/L (ref 0–37)
Albumin: 4.2 g/dL (ref 3.5–5.2)
Alkaline Phosphatase: 68 U/L (ref 39–117)
BILIRUBIN TOTAL: 0.3 mg/dL (ref 0.2–1.2)
BUN: 18 mg/dL (ref 6–23)
CALCIUM: 10 mg/dL (ref 8.4–10.5)
CHLORIDE: 101 meq/L (ref 96–112)
CO2: 33 meq/L — AB (ref 19–32)
Creatinine, Ser: 0.92 mg/dL (ref 0.40–1.20)
GFR: 64.22 mL/min (ref >60.00)
Glucose, Bld: 92 mg/dL (ref 70–99)
Potassium: 4.1 mEq/L (ref 3.5–5.1)
Sodium: 140 mEq/L (ref 135–145)
Total Protein: 7.4 g/dL (ref 6.0–8.3)

## 2016-11-27 LAB — LIPID PANEL
CHOLESTEROL: 248 mg/dL — AB (ref 0–200)
HDL: 62.1 mg/dL (ref >39.00)
NONHDL: 185.64
TRIGLYCERIDES: 238 mg/dL — AB (ref 0.0–149.0)
Total CHOL/HDL Ratio: 4
VLDL: 47.6 mg/dL — ABNORMAL HIGH (ref 0.0–40.0)

## 2016-11-27 LAB — VITAMIN B12: Vitamin B-12: 924 pg/mL — ABNORMAL HIGH (ref 211–911)

## 2016-11-27 LAB — HEMOGLOBIN A1C: Hgb A1c MFr Bld: 5.9 % (ref 4.6–6.5)

## 2016-11-27 MED ORDER — MIRABEGRON ER 25 MG PO TB24: 25.0000 mg | ORAL_TABLET | Freq: Every day | ORAL | 5 refills | Status: DC

## 2016-11-27 MED ORDER — ESCITALOPRAM OXALATE 10 MG PO TABS: 10.0000 mg | ORAL_TABLET | Freq: Every day | ORAL | 5 refills | Status: DC

## 2016-11-27 MED ORDER — BENZONATATE 200 MG PO CAPS: 200.0000 mg | ORAL_CAPSULE | Freq: Three times a day (TID) | ORAL | 1 refills | Status: DC | PRN

## 2016-11-27 MED ORDER — CEFDINIR 300 MG PO CAPS: 300.0000 mg | ORAL_CAPSULE | Freq: Two times a day (BID) | ORAL | 0 refills | Status: DC

## 2016-11-27 MED ORDER — ALPRAZOLAM 0.25 MG PO TABS: 0.2500 mg | ORAL_TABLET | Freq: Two times a day (BID) | ORAL | 0 refills | Status: DC | PRN

## 2016-11-27 NOTE — Assessment & Plan Note (Signed)
Her current symptoms suggest a probable sinus infection Will start [place], which she has been in the past Flonase, [employer] as needed Call if no improvement

## 2016-11-27 NOTE — Assessment & Plan Note (Signed)
Check B12 level. 

## 2016-11-27 NOTE — Assessment & Plan Note (Signed)
Check a1c Low sugar / carb diet Stressed regular exercise, weight loss  

## 2016-11-27 NOTE — Assessment & Plan Note (Signed)
Has been using Xanax as needed, but feels her anxiety is no longer controlled-with like to increase the Xanax if possible Since she is having daily anxiety and possibly some mild depression we will start Lexapro 10 mg daily We will increase Xanax to twice daily as needed-advised her to only take as needed Follow-up in 6 weeks

## 2016-11-27 NOTE — Assessment & Plan Note (Signed)
BP well controlled Current regimen effective and well tolerated Continue current medications at current doses-may need to consider switching hydrochlorothiazide to something else given her increased urination, but we will continue for now. Discussed that she may have ankle swelling if she discontinues the medication CMP

## 2016-11-27 NOTE — Assessment & Plan Note (Signed)
She is not experiencing any GERD symptoms Currently taking Prilosec on a daily basis Recommended trying Prilosec daily to see if it affects her cough

## 2016-11-27 NOTE — Patient Instructions (Addendum)
Have blood work done today  Start cefdinir for the sinus infection.   Start lexapro for anxiety/depression.  Use the xanax as needed.   Start myrbetriq for your bladder.  We should consider changing the hydrochlorothiazide to a different BP med.  Follow up in 6 weeks   Cefdinir was last antibiotic Dr. Quay Burow  Continue doing brain stimulating activities (puzzles, reading, adult coloring books, staying active) to keep memory sharp.   Continue to eat heart healthy diet (full of fruits, vegetables, whole grains, lean protein, water--limit salt, fat, and sugar intake) and increase physical activity as tolerated.   Kayla Maddox , Thank you for taking time to come for your Medicare Wellness Visit. I appreciate your ongoing commitment to your health goals. Please review the following plan we discussed and let me know if I can assist you in the future.   These are the goals we discussed: Goals    . Take 30 minutes in the afternoon just for myself          To enjoy quite, peace and time just for myself.       This is a list of the screening recommended for you and due dates:  Health Maintenance  Topic Date Due  . Flu Shot  10/11/2016  . Mammogram  03/20/2017  . Colon Cancer Screening  08/23/2019  . Tetanus Vaccine  10/29/2023  . DEXA scan (bone density measurement)  Completed  .  Hepatitis C: One time screening is recommended by Center for Disease Control  (CDC) for  adults born from [birth year] through 1963.   Completed  . Pneumonia vaccines  Completed   Influenza Virus Vaccine injection What is this medicine? INFLUENZA VIRUS VACCINE (in floo EN zuh VAHY ruhs vak SEEN) helps to reduce the risk of getting influenza also known as the flu. The vaccine only helps protect you against some strains of the flu. This medicine may be used for other purposes; ask your health care provider or pharmacist if you have questions. COMMON BRAND NAME(S): Afluria, Agriflu, Alfuria, FLUAD, Fluarix, Fluarix  Quadrivalent, Flublok, Flublok Quadrivalent, FLUCELVAX, Flulaval, Fluvirin, Fluzone, Fluzone High-Dose, Fluzone Intradermal What should I tell my health care provider before I take this medicine? They need to know if you have any of these conditions: -bleeding disorder like hemophilia -fever or infection -Guillain-Barre syndrome or other neurological problems -immune system problems -infection with the human immunodeficiency virus (HIV) or AIDS -low blood platelet counts -multiple sclerosis -an unusual or allergic reaction to influenza virus vaccine, latex, other medicines, foods, dyes, or preservatives. Different brands of vaccines contain different allergens. Some may contain latex or eggs. Talk to your doctor about your allergies to make sure that you get the right vaccine. -pregnant or trying to get pregnant -breast-feeding How should I use this medicine? This vaccine is for injection into a muscle or under the skin. It is given by a health care professional. A copy of Vaccine Information Statements will be given before each vaccination. Read this sheet carefully each time. The sheet may change frequently. Talk to your healthcare provider to see which vaccines are right for you. Some vaccines should not be used in all age groups. Overdosage: If you think you have taken too much of this medicine contact a poison control center or emergency room at once. NOTE: This medicine is only for you. Do not share this medicine with others. What if I miss a dose? This does not apply. What may interact with this medicine? -chemotherapy or  radiation therapy -medicines that lower your immune system like etanercept, anakinra, infliximab, and adalimumab -medicines that treat or prevent blood clots like warfarin -phenytoin -steroid medicines like prednisone or cortisone -theophylline -vaccines This list may not describe all possible interactions. Give your health care provider a list of all the  medicines, herbs, non-prescription drugs, or dietary supplements you use. Also tell them if you smoke, drink alcohol, or use illegal drugs. Some items may interact with your medicine. What should I watch for while using this medicine? Report any side effects that do not go away within 3 days to your doctor or health care professional. Call your health care provider if any unusual symptoms occur within 6 weeks of receiving this vaccine. You may still catch the flu, but the illness is not usually as bad. You cannot get the flu from the vaccine. The vaccine will not protect against colds or other illnesses that may cause fever. The vaccine is needed every year. What side effects may I notice from receiving this medicine? Side effects that you should report to your doctor or health care professional as soon as possible: -allergic reactions like skin rash, itching or hives, swelling of the face, lips, or tongue Side effects that usually do not require medical attention (report to your doctor or health care professional if they continue or are bothersome): -fever -headache -muscle aches and pains -pain, tenderness, redness, or swelling at the injection site -tiredness This list may not describe all possible side effects. Call your doctor for medical advice about side effects. You may report side effects to FDA at 1-800-FDA-1088. Where should I keep my medicine? The vaccine will be given by a health care professional in a clinic, pharmacy, doctor's office, or other health care setting. You will not be given vaccine doses to store at home. NOTE: This sheet is a summary. It may not cover all possible information. If you have questions about this medicine, talk to your doctor, pharmacist, or health care provider.  2018 Elsevier/Gold Standard (2014-09-18 10:07:28)  Cancer Survivorship and Stress Management Being diagnosed with cancer is very stressful. As a cancer survivor, you are likely facing a lot of  health changes as well as challenges in many other areas of your life. How you handle the stress that comes with these challenges can make a big difference in your health and well-being. What types of stress are common for a cancer survivor? Cancer survivors may experience many types of stress:  Emotional stress. You may feel a wide range of emotions. At times, you may feel thankful and relieved, and other times you may feel angry, afraid, guilty, or depressed. You may also feel what is called survivor guilt because you survived when others with cancer did not. Cancer survivors, caregivers, and loved ones may also develop post-traumatic stress disorder (PTSD), an anxiety disorder that can affect people who have survived an event that has threatened their life.  Financial stress. Cancer can cause financial hardship in many ways, such as: ? Lost income from time off work. ? Treatment costs, especially for people who lack health insurance coverage. ? The cost of nursing care or child care if family support is lacking.  Interpersonal stress. When you have cancer, it affects your whole family. Family and friends may feel helpless. Spouses or others often take on more duties, and children may worry about you and the family's future. Your own stress may change the way you interact with others.  Work-related stress. Going back to work  takes planning and can be tiring. Co-workers may not know what to say, or they may wonder how your return will affect them. Answering questions about your cancer and treatment may make you feel uncomfortable.  Why is it important to manage stress? When you experience high levels of stress over a long period, it can affect your mind and body. It may affect your body's ability to heal properly and to fight off illness. It can also affect how your body responds to cancer treatments. Using strategies to cope with stress during and after cancer treatment can lead to lower levels of  depression, anxiety, and other symptoms. How can I effectively manage stress? Finding ways to manage stress is an important part of living with cancer. Try these steps to help manage the various types of stress you may be experiencing: Emotional stress management Talk with your health care provider about your stress level and how you are managing it. Be honest. If you need help, your health care provider may refer you to another professional, such as a psychologist, social worker, chaplain, or psychiatrist. With help from your health care provider, you can also try:  Relaxation techniques such as meditation or deep breathing.  Counseling or talk therapy.  Medicines for depression or anxiety.  Writing about your feelings. Keeping a journal can help you let go of worries and fears.  Exercise or other physical activities. Talk with your health care provider about what activities are safe for you. Options may include: ? Walking for a few minutes each day. ? Dancing, gardening, or other enjoyable activities. ? Strength training. ? Yoga or tai chi.  Financial stress management  Talk with the hospital's Education officer, museum, business office, or Chief Strategy Officer. They may be able to help you set up a payment plan or may know of resources that can help with transportation, home care, and other expenses.  Talk with your insurance company about what kinds of treatment and care options are covered each step of the way. Ask if a case manager can be assigned to you so you can talk with the same person each time that you have questions.  Talk with your health care provider about generic drugs, patient assistance programs, and other measures that can help reduce costs in your treatment plan.  Stay on track with your follow-up plan of care to help avoid more health problems and hospital stays.  Reach out to your bank and the companies you owe. They may be able to set up payment plans and other options during  financial hardship.  Look for support services that can help you cope financially. A good place to find resources is the [employer] website: https://supportorgs.cancer.gov  Organize your hospital bills, insurance statements, and other financial information in order to track and find information easily. For instance, you can have a separate folder for different kinds of paperwork. You can also organize folders electronically on your computer. Interpersonal stress management  Accept help when it is offered. Make a list of things you might need help with, and let others choose what they can do.  Ask for help if you need it. Friends and family may want to help but may feel uncomfortable offering. To give your caregivers a break, you could also look for paid help or check with organizations in your community, such as charities, religious organizations, or cancer support groups.  Do as much as you can for yourself, with approval from your health care provider. Staying active, taking  care of yourself, being social, and doing things you enjoy can help you feel better sooner.  Let your caregivers, family, and friends know that you are thankful for them and their help. Work-related stress management  Do not take on too much too soon. Talk with your employer about options such as part-time hours, job sharing, unpaid time off, or frequent breaks to take medicine or to see a health care provider. You may be protected by laws for disabled employees.  Think about how much you want to tell your co-workers about your illness. Keeping your explanation simple and positive can help you and your co-workers feel more at ease.  If you are not yet comfortable with certain questions or comments, politely say so.  Try to think of any comments that might bother you, and decide how you will respond. Contact a health care provider if:  Stress is interfering with your life and relationships.  You would  like a referral to a mental health specialist or other resources.  You need help with transportation, home care, or other needs. Your health care team may know of resources in the community. Get help right away if:  You have a sense of panic or are overwhelmed with stress, sadness, or anxiety.  You have thoughts of wanting to die or harm yourself or others in some way. If you ever feel like you may hurt yourself or others, or have thoughts about taking your own life, get help right away. You can go to your nearest emergency department or call:  Your local emergency services (911 in the [place]  A suicide crisis helpline, such as the [place] at 514-886-4579. This is open 24 hours a day.  Summary  Recovering from cancer is stressful. Using coping strategies may make a difference.  Your finances, relationships, and work life will change, and you may need help and time to adjust.  Seek and accept help from your health care provider, mental health professionals, loved ones, and community resources if needed.  Do what you can for yourself, and let others know that you are thankful for their help.  If you feel overwhelmed by stress, sadness, or anxiety, get professional help right away. This information is not intended to replace advice given to you by your health care provider. Make sure you discuss any questions you have with your health care provider. Document Released: 11/05/2015 Document Revised: 11/05/2015 Document Reviewed: 11/05/2015 Elsevier Interactive Patient Education  [employer].

## 2016-11-28 LAB — LDL CHOLESTEROL, DIRECT: Direct LDL: 163 mg/dL

## 2016-11-30 ENCOUNTER — Encounter: Payer: Self-pay | Admitting: Internal Medicine

## 2016-12-14 ENCOUNTER — Encounter: Payer: Self-pay | Admitting: Internal Medicine

## 2017-01-07 DIAGNOSIS — N3281 Overactive bladder: Secondary | ICD-10-CM | POA: Insufficient documentation

## 2017-01-07 NOTE — Progress Notes (Signed)
Subjective:    Patient ID: Kayla Maddox, female    DOB: October 18, 1945, 71 y.o.   MRN: [id number]  HPI The patient is here for follow up.  Anxiety, depression: we started lexapro 6 weeks ago.  She is taking her medication daily as prescribed. She uses the xanax as needed.  She denies any side effects from the medication. She feels her anxiety and depression are better controlled and she is happy with her current dose of medication.  She is happy with the new medication.  Overactive bladder:  She started myrbetriq 6 weeks ago for increased frequency and urgency.  She has had decreased urinary frequency and is able to make it to the bathroom now.  She denies any side effects and feels the dose of the medication is adequate.  She has had a sensation of her throat closing up intermittently for a while.  This tends to occur 1-2 times a month.  Usually occurs if she is eating something-it just feels that her throat wants to close up and this most recent episode her throat to close up.  She is able to breathe-she feels she is able to breathing, but has difficulty breathing out.  This past episode she had severe burning in the center of her chest.  She thinks her episodes are increasing.  She does have heartburn and was taking her omeprazole as needed, but started taking it daily to see if that would help.  She denies any nausea or pain with swallowing.  On occasion she will have trouble swallowing, but only if she is nervous-no other time.   Medications and allergies reviewed with patient and updated if appropriate.  Patient Active Problem List   Diagnosis Date Noted  . Esophageal spasm 01/08/2017  . Overactive bladder 01/07/2017  . Sinus infection 11/27/2016  . B12 deficiency 11/26/2016  . Chest pain 05/24/2016  . RLQ discomfort 05/24/2016  . Prediabetes 05/06/2015  . Fatty liver 05/04/2015  . Sensation of feeling hot 02/16/2015  . Obesity 11/13/2014  . Dizziness 06/12/2014  . Flushing  04/29/2014  . Hematuria, gross 04/16/2014  . Back pain with sciatica 11/06/2013  . Anxiety and depression 10/28/2013  . Crohn's colitis ([place]) 10/21/2013  . Adenomatous colon polyp 10/21/2013  . Gastroesophageal reflux disease without esophagitis 10/21/2013  . Hyperlipidemia 02/10/2010  . Essential hypertension 02/10/2010  . Asthma 02/10/2010  . OSA (obstructive sleep apnea) 02/10/2010  . DYSPNEA 02/10/2010  . Upper airway cough syndrome 02/09/2010    Current Outpatient Prescriptions on File Prior to Visit  Medication Sig Dispense Refill  . benzonatate (TESSALON) 200 MG capsule Take 1 capsule (200 mg total) by mouth 3 (three) times daily as needed for cough. 30 capsule 1  . escitalopram (LEXAPRO) 10 MG tablet Take 1 tablet (10 mg total) by mouth daily. 30 tablet 5  . fluticasone (FLONASE) 50 MCG/ACT nasal spray INHALE 2 SPRAYS IN EACH NOSTRIL EVERY DAY  11  . hydrochlorothiazide (HYDRODIURIL) 25 MG tablet TAKE ONE TABLET BY MOUTH DAILY 30 tablet 5  . mesalamine (LIALDA) 1.2 g EC tablet TAKE 4 TABLETS EVERY DAY WITH BREAKFAST 360 tablet 3  . mirabegron ER (MYRBETRIQ) 25 MG TB24 tablet Take 1 tablet (25 mg total) by mouth daily. 30 tablet 5  . omeprazole (PRILOSEC) 40 MG capsule Take 1 capsule (40 mg total) by mouth daily. (Patient taking differently: Take 40 mg by mouth as needed. ) 90 capsule 2   No current facility-administered medications on file prior to  visit.     Past Medical History:  Diagnosis Date  . Allergic rhinitis   . Allergy   . Anxiety   . Arthritis   . Cataract    bilat removed   . Chronic colitis   . Crohn's colitis ([place])   . Diverticulosis   . Fundic gland polyps of stomach, benign   . Gallbladder polyp 2017  . Gastric heterotopia   . GERD (gastroesophageal reflux disease)   . Hyperlipidemia   . Hyperplastic colon polyp   . Hypertension   . Obesity   . Sleep apnea   . Tubular adenoma of colon     Past Surgical History:  Procedure Laterality Date  .  BLADDER SUSPENSION  2011  . COLONOSCOPY    . FOOT SURGERY Left 2005  . NASAL SINUS SURGERY  2012  . POLYPECTOMY    . [place]  . UPPER GASTROINTESTINAL ENDOSCOPY      Social History   Social History  . Marital status: Married    Spouse name: N/A  . Number of children: 1  . 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 Smoking status: Former Smoker    Packs/day: 1.00    Years: 20.00    Types: Cigarettes    Quit date: 03/13/1981  . Smokeless tobacco: Never Used  . Alcohol use No  . Drug use: No  . Sexual activity: Not on file   Other Topics Concern  . Not on file   Social History Narrative   No exercise      Retiring in June 2017    Family History  Problem Relation Age of Onset  . Diabetes Father   . Hypertension Father   . Heart disease Father   . Hypertension Mother   . Heart disease Mother   . Colon cancer Neg Hx   . Esophageal cancer Neg Hx   . Rectal cancer Neg Hx   . Stomach cancer Neg Hx   . Colon polyps Neg Hx     Review of Systems  Cardiovascular: Positive for chest pain (likely esophageal).  Gastrointestinal: Negative for abdominal pain and nausea.       Gerd controlled  Genitourinary: Negative for dysuria and frequency.  Psychiatric/Behavioral: Positive for dysphoric mood (controlled). The patient is nervous/anxious (controlled).        Objective:   Vitals:   01/08/17 1112 01/08/17 1139  BP: (!) 148/80 (!) 142/78  Pulse: 88   Resp: 16   Temp: 98.3 F (36.8 C)   SpO2: 94%    Wt Readings from Last 3 Encounters:  01/08/17 222 lb (100.7 kg)  11/27/16 225 lb (102.1 kg)  08/22/16 215 lb (97.5 kg)   Body mass index is 36.94 kg/m.   Physical Exam    Constitutional: Appears well-developed and well-nourished. No distress.  HENT:  Head: Normocephalic and atraumatic.  Skin: Skin is warm and dry. Not diaphoretic.  Psychiatric: Normal mood and affect. Behavior is  normal.      Assessment & Plan:    See Problem List for Assessment and Plan of chronic medical problems.

## 2017-01-07 NOTE — Patient Instructions (Addendum)
Medications reviewed and updated.  Changes include starting zetia for your cholesterol.   Your prescription(s) have been submitted to your pharmacy. Please take as directed and contact our office if you believe you are having problem(s) with the medication(s).   Please followup in 6 months      Esophageal Spasm An esophageal spasm is a muscle spasm of the tube that connects the back of your throat to your stomach (esophagus). Your esophagus normally moves food and liquids down into your stomach with smooth, wavelike muscle contractions. If you have esophageal spasms, abnormal muscle contractions in your esophagus can be painful and cause you to have difficulty swallowing (dysphagia). There are two types of esophageal spasms. You may have one or both types:  Diffuse esophageal spasms. These are irregular, uncoordinated muscle movements of the esophagus. The diffuse type causes more dysphagia.  Nutcracker esophagus. This is a type of muscle contraction that is coordinated but too strong. The muscles move in a regular order, but the contraction is stronger than necessary. The nutcracker type of esophageal spasm is more painful.  Severe cases of esophageal spasm can make it hard to eat well and do all your usual activities. Esophageal spasms often occur along with severe heartburn (reflux esophagitis). Swallowed foods or liquids may also come back up into your throat (regurgitation). What are the causes? The cause of esophageal spasm is not known. What increases the risk? You may have a higher risk for esophageal spasm if you:  Are female.  Are an older person.  Eat very quickly.  Swallow foods or drinks that are very hot or very cold.  Are depressed or anxious.  What are the signs or symptoms? Signs and symptoms can vary from day to day. They may be mild or severe. They may last for minutes or hours. Common signs and symptoms include:  Chest pain. This may feel like a heart  attack.  Back pain.  Dysphagia.  Heartburn.  The feeling that something is stuck in your throat (globus).  Regurgitation of foods or liquids.  How is this diagnosed? Your health care provider may suspect esophageal spasm based on your symptoms and a physical exam. Tests may be done to help confirm the diagnosis. These may include:  Endoscopy. A flexible telescope is passed into your esophagus.  Barium swallow. An X-ray is done while you drink a substance (contrast material) that shows up on X-ray.  Esophageal manometry. Pressure measurements of the inside of your esophagus are taken while you swallow.  How is this treated? Mild cases of esophageal spasms may not need to be treated. You may be able to manage the spasms by avoiding the foods and eating habits that trigger them. Treatment for spasms that are more severe or frequent can include the following:  You may be given medicine to: ? Relax the esophageal muscles. ? Relieve muscle spasms (calcium channel blockers and nitrates). ? Block nerve endings in the esophagus. This is done with an injection of a certain toxin (botulinum). ? Relieve heartburn (proton pump inhibitors).  Antidepressant medicines are sometimes used to ease symptoms.  For severe cases, surgery is sometimes done to reduce esophageal muscle contractions (myotomy).  Follow these instructions at home:  Take medicines only as directed by your health care provider.  Do not eat foods that trigger heartburn or esophageal spasms.  Eat your meals slowly.  Chew your food completely.  Avoid foods and drinks that are very hot or very cold.  Find ways to manage  stress.  Ask for help if you struggle with depression or anxiety.  Keep all follow-up visits as directed by your health care provider. This is important. Contact a health care provider if:  Your symptoms change or get worse.  You are losing weight because of dysphagia.  Your medicine is not  helping your symptoms.  Your esophageal spasms are interfering with your quality of life. Get help right away if:  You have very bad or unusual chest pain.  You have trouble breathing.  You choke. This information is not intended to replace advice given to you by your health care provider. Make sure you discuss any questions you have with your health care provider. Document Released: 05/20/2002 Document Revised: 08/05/2015 Document Reviewed: 05/23/2013 Elsevier Interactive Patient Education  [employer].

## 2017-01-07 NOTE — Assessment & Plan Note (Addendum)
She has been taking myrbetriq for 6 weeks Urgency and frequency improved No need to increase dose at this time Continue it at 25 mg daily

## 2017-01-08 ENCOUNTER — Encounter: Payer: Self-pay | Admitting: Internal Medicine

## 2017-01-08 ENCOUNTER — Ambulatory Visit (INDEPENDENT_AMBULATORY_CARE_PROVIDER_SITE_OTHER): Payer: Medicare Other | Admitting: Internal Medicine

## 2017-01-08 VITALS — BP 142/78 | HR 88 | Temp 98.3°F | Resp 16 | Wt 222.0 lb

## 2017-01-08 DIAGNOSIS — F329 Major depressive disorder, single episode, unspecified: Secondary | ICD-10-CM

## 2017-01-08 DIAGNOSIS — F419 Anxiety disorder, unspecified: Secondary | ICD-10-CM

## 2017-01-08 DIAGNOSIS — N3281 Overactive bladder: Secondary | ICD-10-CM

## 2017-01-08 DIAGNOSIS — K224 Dyskinesia of esophagus: Secondary | ICD-10-CM | POA: Diagnosis not present

## 2017-01-08 DIAGNOSIS — K219 Gastro-esophageal reflux disease without esophagitis: Secondary | ICD-10-CM

## 2017-01-08 MED ORDER — ALPRAZOLAM 0.25 MG PO TABS: 0.2500 mg | ORAL_TABLET | Freq: Two times a day (BID) | ORAL | 0 refills | Status: DC | PRN

## 2017-01-08 MED ORDER — EZETIMIBE 10 MG PO TABS: 10.0000 mg | ORAL_TABLET | Freq: Every day | ORAL | 3 refills | Status: DC

## 2017-01-08 NOTE — Assessment & Plan Note (Signed)
She is taking Lexapro daily and this has improved her anxiety and probable mild depression We will continue with the current dose, but advised that we can increase the dose if needed Continue Xanax as needed-we will fax to pharmacy Follow-up in 6 months, sooner if needed

## 2017-01-08 NOTE — Assessment & Plan Note (Signed)
Continue omeprazole daily

## 2017-01-08 NOTE — Assessment & Plan Note (Signed)
Symptoms consistent with esophageal spasms Advised her to follow up with Dr Hilarie Fredrickson if symptoms persist Discussed we can try a different htn medication if symptoms persist, but would recommend seeing Dr Hilarie Fredrickson

## 2017-01-09 ENCOUNTER — Telehealth: Payer: Self-pay | Admitting: Internal Medicine

## 2017-01-09 MED ORDER — ALPRAZOLAM 0.25 MG PO TABS: 0.2500 mg | ORAL_TABLET | Freq: Two times a day (BID) | ORAL | 0 refills | Status: DC | PRN

## 2017-01-09 NOTE — Telephone Encounter (Signed)
Pharmacy called asking if the Xanax prescription could be resent. They have not received it.

## 2017-01-09 NOTE — Telephone Encounter (Signed)
RX faxed to [facility] 

## 2017-03-21 ENCOUNTER — Encounter: Payer: Self-pay | Admitting: Internal Medicine

## 2017-03-21 LAB — HM MAMMOGRAPHY

## 2017-03-28 ENCOUNTER — Encounter: Payer: Self-pay | Admitting: Internal Medicine

## 2017-03-28 ENCOUNTER — Other Ambulatory Visit: Payer: Self-pay | Admitting: Internal Medicine

## 2017-04-06 ENCOUNTER — Other Ambulatory Visit: Payer: Self-pay | Admitting: Internal Medicine

## 2017-06-04 ENCOUNTER — Other Ambulatory Visit: Payer: Self-pay | Admitting: Internal Medicine

## 2017-06-10 ENCOUNTER — Other Ambulatory Visit: Payer: Self-pay | Admitting: Internal Medicine

## 2017-07-02 ENCOUNTER — Encounter: Payer: Self-pay | Admitting: Internal Medicine

## 2017-07-06 ENCOUNTER — Other Ambulatory Visit: Payer: Self-pay | Admitting: Internal Medicine

## 2017-07-08 NOTE — Progress Notes (Signed)
Subjective:    Patient ID: Kayla Maddox, female    DOB: 1945/04/08, 72 y.o.   MRN: [id number]  HPI The patient is here for follow up.  Hypertension: She is taking her medication daily. She is compliant with a low sodium diet.  She denies chest pain, palpitations, edema, shortness of breath and regular headaches. She is exercising regularly - going to gym - riding bikes and some weights.  She states she could do more exercise.  Prediabetes:  She is fairly compliant with a low sugar/carbohydrate diet.  She is exercising.  OSA:  She is not using her cpap nightly.  She sleeps in a recliner.  She feels refreshed when waking.  She does not feel tired.  She would rather not use the CPAP machine.  Overactive bladder:  She is taking myrbetriq daily.  She feels medication works well.  Depression, anxiety: She is taking her medication daily as prescribed. She takes the xanax only as needed.  She denies any side effects from the medication. She feels her depression and anxiety are controlled and she is happy with her current dose of medication.   GERD:  She is taking her medication daily as prescribed.  She denies any GERD symptoms and feels her GERD is well controlled.   Obesity:  She is dong some exercise.  She goes to the gym and rides the bike and does some weights.  She eats a lot of chicken and veges.  She only snacks after supper.  She does eat sweets and could probably cut back on those.  She has been trying to lose weight and has had difficulty.  She wondered about a weight loss medication.  Medications and allergies reviewed with patient and updated if appropriate.  Patient Active Problem List   Diagnosis Date Noted  . Esophageal spasm 01/08/2017  . Overactive bladder 01/07/2017  . B12 deficiency 11/26/2016  . Chest pain 05/24/2016  . RLQ discomfort 05/24/2016  . Prediabetes 05/06/2015  . Fatty liver 05/04/2015  . Sensation of feeling hot 02/16/2015  . Obesity 11/13/2014  .  Dizziness 06/12/2014  . Flushing 04/29/2014  . Hematuria, gross 04/16/2014  . Back pain with sciatica 11/06/2013  . Anxiety and depression 10/28/2013  . Crohn's colitis ([place]) 10/21/2013  . Adenomatous colon polyp 10/21/2013  . Gastroesophageal reflux disease without esophagitis 10/21/2013  . Hyperlipidemia 02/10/2010  . Essential hypertension 02/10/2010  . Asthma 02/10/2010  . OSA (obstructive sleep apnea) 02/10/2010  . DYSPNEA 02/10/2010  . Upper airway cough syndrome 02/09/2010    Current Outpatient Medications on File Prior to Visit  Medication Sig Dispense Refill  . ALPRAZolam (XANAX) 0.25 MG tablet Take 1 tablet (0.25 mg total) by mouth 2 (two) times daily as needed for anxiety. 60 tablet 0  . benzonatate (TESSALON) 200 MG capsule Take 1 capsule (200 mg total) by mouth 3 (three) times daily as needed for cough. 30 capsule 1  . escitalopram (LEXAPRO) 10 MG tablet TAKE ONE TABLET BY MOUTH DAILY 30 tablet 1  . ezetimibe (ZETIA) 10 MG tablet Take 1 tablet (10 mg total) by mouth daily. 90 tablet 3  . fluticasone (FLONASE) 50 MCG/ACT nasal spray INHALE 2 SPRAYS IN EACH NOSTRIL EVERY DAY  11  . hydrochlorothiazide (HYDRODIURIL) 25 MG tablet Take 1 tablet (25 mg total) by mouth daily. Annual appt is due must see provider for future refills 30 tablet 0  . mesalamine (LIALDA) 1.2 g EC tablet TAKE 4 TABLETS EVERY DAY WITH BREAKFAST  360 tablet 3  . mirabegron ER (MYRBETRIQ) 25 MG TB24 tablet Take 1 tablet (25 mg total) by mouth daily. Annual appt is due must see provider for future refills 30 tablet 0  . omeprazole (PRILOSEC) 40 MG capsule TAKE ONE CAPSULE BY MOUTH DAILY 90 capsule 1   No current facility-administered medications on file prior to visit.     Past Medical History:  Diagnosis Date  . Allergic rhinitis   . Allergy   . Anxiety   . Arthritis   . Cataract    bilat removed   . Chronic colitis   . Crohn's colitis ([place])   . Diverticulosis   . Fundic gland polyps of stomach,  benign   . Gallbladder polyp 2017  . Gastric heterotopia   . GERD (gastroesophageal reflux disease)   . Hyperlipidemia   . Hyperplastic colon polyp   . Hypertension   . Obesity   . Sleep apnea   . Tubular adenoma of colon     Past Surgical History:  Procedure Laterality Date  . BLADDER SUSPENSION  2011  . COLONOSCOPY    . FOOT SURGERY Left 2005  . NASAL SINUS SURGERY  2012  . POLYPECTOMY    . [place]  . UPPER GASTROINTESTINAL ENDOSCOPY      Social History   Socioeconomic History  . Marital status: Married    Spouse name: Not on file  . Number of children: 1  . Years of education: Not on file  . Highest education level: Not on file  Occupational History  . 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 Financial resource strain: Not on file  . Food insecurity:    Worry: Not on file    Inability: Not on file  . Transportation needs:    Medical: Not on file    Non-medical: Not on file  Tobacco Use  . Smoking status: Former Smoker    Packs/day: 1.00    Years: 20.00    Pack years: 20.00    Types: Cigarettes    Last attempt to quit: 03/13/1981    Years since quitting: 36.3  . Smokeless tobacco: Never Used  Substance and Sexual Activity  . Alcohol use: No  . Drug use: No  . Sexual activity: Not on file  Lifestyle  . Physical activity:    Days per week: Not on file    Minutes per session: Not on file  . Stress: Not on file  Relationships  . Social connections:    Talks on phone: Not on file    Gets together: Not on file    Attends religious service: Not on file    Active member of club or organization: Not on file    Attends meetings of clubs or organizations: Not on file    Relationship status: Not on file  Other Topics Concern  . Not on file  Social History Narrative   No exercise      Retiring in June 2017    Family History  Problem Relation Age of Onset  . Diabetes Father   . Hypertension Father   . Heart disease  Father   . Hypertension Mother   . Heart disease Mother   . Colon cancer Neg Hx   . Esophageal cancer Neg Hx   . Rectal cancer Neg Hx   . Stomach cancer Neg Hx   . Colon polyps Neg Hx     Review of Systems  Constitutional: Negative for chills and fever.  Respiratory: Positive for cough (improved). Negative for shortness of breath and wheezing.   Cardiovascular: Negative for chest pain, palpitations and leg swelling.  Neurological: Negative for dizziness, light-headedness and headaches.       Objective:   Vitals:   07/10/17 1127  BP: 134/80  Pulse: 77  Resp: 16  Temp: 98.6 F (37 C)  SpO2: 94%   BP Readings from Last 3 Encounters:  07/10/17 134/80  01/08/17 (!) 142/78  11/27/16 127/68   Wt Readings from Last 3 Encounters:  07/10/17 216 lb (98 kg)  01/08/17 222 lb (100.7 kg)  11/27/16 225 lb (102.1 kg)   Body mass index is 35.94 kg/m.   Physical Exam    Constitutional: Appears well-developed and well-nourished. No distress.  HENT:  Head: Normocephalic and atraumatic.  Neck: Neck supple. No tracheal deviation present. No thyromegaly present.  No cervical lymphadenopathy Cardiovascular: Normal rate, regular rhythm and normal heart sounds.   No murmur heard. No carotid bruit .  No edema Pulmonary/Chest: Effort normal and breath sounds normal. No respiratory distress. No has no wheezes. No rales.  Skin: Skin is warm and dry. Not diaphoretic.  Psychiatric: Normal mood and affect. Behavior is normal.      Assessment & Plan:    See Problem List for Assessment and Plan of chronic medical problems.

## 2017-07-10 ENCOUNTER — Encounter: Payer: Self-pay | Admitting: Internal Medicine

## 2017-07-10 ENCOUNTER — Other Ambulatory Visit (INDEPENDENT_AMBULATORY_CARE_PROVIDER_SITE_OTHER): Payer: Medicare Other

## 2017-07-10 ENCOUNTER — Ambulatory Visit: Payer: Medicare Other | Admitting: Internal Medicine

## 2017-07-10 VITALS — BP 134/80 | HR 77 | Temp 98.6°F | Resp 16 | Wt 216.0 lb

## 2017-07-10 DIAGNOSIS — N3281 Overactive bladder: Secondary | ICD-10-CM

## 2017-07-10 DIAGNOSIS — F32A Depression, unspecified: Secondary | ICD-10-CM

## 2017-07-10 DIAGNOSIS — R7303 Prediabetes: Secondary | ICD-10-CM

## 2017-07-10 DIAGNOSIS — K219 Gastro-esophageal reflux disease without esophagitis: Secondary | ICD-10-CM

## 2017-07-10 DIAGNOSIS — I10 Essential (primary) hypertension: Secondary | ICD-10-CM

## 2017-07-10 DIAGNOSIS — F419 Anxiety disorder, unspecified: Secondary | ICD-10-CM | POA: Diagnosis not present

## 2017-07-10 DIAGNOSIS — E782 Mixed hyperlipidemia: Secondary | ICD-10-CM | POA: Diagnosis not present

## 2017-07-10 DIAGNOSIS — F329 Major depressive disorder, single episode, unspecified: Secondary | ICD-10-CM | POA: Diagnosis not present

## 2017-07-10 DIAGNOSIS — E669 Obesity, unspecified: Secondary | ICD-10-CM

## 2017-07-10 LAB — LIPID PANEL
CHOLESTEROL: 200 mg/dL (ref 0–200)
HDL: 52.5 mg/dL (ref >39.00)
LDL CALC: 116 mg/dL — AB (ref 0–99)
NonHDL: 147.55
TRIGLYCERIDES: 159 mg/dL — AB (ref 0.0–149.0)
Total CHOL/HDL Ratio: 4
VLDL: 31.8 mg/dL (ref 0.0–40.0)

## 2017-07-10 LAB — COMPREHENSIVE METABOLIC PANEL
ALBUMIN: 4.3 g/dL (ref 3.5–5.2)
ALT: 16 U/L (ref 0–35)
AST: 15 U/L (ref 0–37)
Alkaline Phosphatase: 56 U/L (ref 39–117)
BUN: 12 mg/dL (ref 6–23)
CALCIUM: 10.1 mg/dL (ref 8.4–10.5)
CHLORIDE: 101 meq/L (ref 96–112)
CO2: 34 mEq/L — ABNORMAL HIGH (ref 19–32)
Creatinine, Ser: 0.77 mg/dL (ref 0.40–1.20)
GFR: 78.72 mL/min (ref >60.00)
Glucose, Bld: 95 mg/dL (ref 70–99)
Potassium: 4.3 mEq/L (ref 3.5–5.1)
Sodium: 142 mEq/L (ref 135–145)
Total Bilirubin: 0.6 mg/dL (ref 0.2–1.2)
Total Protein: 7.2 g/dL (ref 6.0–8.3)

## 2017-07-10 LAB — HEMOGLOBIN A1C: HEMOGLOBIN A1C: 5.9 % (ref 4.6–6.5)

## 2017-07-10 LAB — TSH: TSH: 2.29 u[IU]/mL (ref 0.35–4.50)

## 2017-07-10 MED ORDER — VITAMIN B-12 1000 MCG PO TABS: 1000.0000 ug | ORAL_TABLET | Freq: Every day | ORAL | Status: AC

## 2017-07-10 MED ORDER — HYDROCHLOROTHIAZIDE 25 MG PO TABS: 25.0000 mg | ORAL_TABLET | Freq: Every day | ORAL | 3 refills | Status: DC

## 2017-07-10 MED ORDER — MIRABEGRON ER 25 MG PO TB24: 25.0000 mg | ORAL_TABLET | Freq: Every day | ORAL | 3 refills | Status: DC

## 2017-07-10 MED ORDER — BUPROPION HCL ER (XL) 150 MG PO TB24: 150.0000 mg | ORAL_TABLET | Freq: Every day | ORAL | 1 refills | Status: DC

## 2017-07-10 NOTE — Assessment & Plan Note (Signed)
Controlled, stable Continue current dose of medication  

## 2017-07-10 NOTE — Assessment & Plan Note (Signed)
BP well controlled Current regimen effective and well tolerated Continue current medications at current doses CMP 

## 2017-07-10 NOTE — Patient Instructions (Addendum)
Weight loss medication: saxenda - injection belviq - daily medication contrave - wellbutrin/naltrexone Qysmia - topamax / phentermine  Other options: metformin and wellbutrin (mild anti-depressant)    Test(s) ordered today. Your results will be released to [facility] (or called to you) after review, usually within 72hours after test completion. If any changes need to be made, you will be notified at that same time.   Medications reviewed and updated.  Changes include adding wellbutrin daily.     Please followup in 6 months

## 2017-07-10 NOTE — Assessment & Plan Note (Signed)
Overall controlled Continue Lexapro, dose We will add Wellbutrin mostly for weight loss, but may augment the Lexapro as well Continue Xanax only as needed

## 2017-07-10 NOTE — Assessment & Plan Note (Signed)
Check lipid panel  Continue Zetia Regular exercise and healthy diet encouraged

## 2017-07-10 NOTE — Assessment & Plan Note (Signed)
Check a1c Low sugar / carb diet Stressed regular exercise, weight loss  

## 2017-07-10 NOTE — Assessment & Plan Note (Signed)
Having difficulty losing weight Complications of her obesity include prediabetes, hypertension, GERD Discussed the importance of increasing her current activity level Discussed low sugar/carbohydrate diet and decreasing portions Will refer to nutrition Discussed weight management clinic and medication options We will try adding Wellbutrin, can consider metformin as well

## 2017-07-10 NOTE — Assessment & Plan Note (Signed)
GERD controlled Continue daily medication  

## 2017-07-11 ENCOUNTER — Encounter: Payer: Self-pay | Admitting: Internal Medicine

## 2017-08-12 ENCOUNTER — Other Ambulatory Visit: Payer: Self-pay | Admitting: Internal Medicine

## 2017-09-05 ENCOUNTER — Encounter: Payer: Self-pay | Admitting: *Deleted

## 2017-09-20 ENCOUNTER — Ambulatory Visit: Payer: Medicare Other | Admitting: Internal Medicine

## 2017-09-20 ENCOUNTER — Encounter: Payer: Self-pay | Admitting: Internal Medicine

## 2017-09-20 VITALS — BP 114/64 | HR 73 | Ht 65.0 in | Wt 208.0 lb

## 2017-09-20 DIAGNOSIS — K219 Gastro-esophageal reflux disease without esophagitis: Secondary | ICD-10-CM

## 2017-09-20 DIAGNOSIS — Z86010 Personal history of colonic polyps: Secondary | ICD-10-CM | POA: Diagnosis not present

## 2017-09-20 DIAGNOSIS — K5010 Crohn's disease of large intestine without complications: Secondary | ICD-10-CM | POA: Diagnosis not present

## 2017-09-20 MED ORDER — MESALAMINE 1.2 G PO TBEC: DELAYED_RELEASE_TABLET | ORAL | 2 refills | Status: DC

## 2017-09-20 NOTE — Patient Instructions (Signed)
We have sent the following medications to your pharmacy for you to pick up at your convenience: Lialda 4.8 mg daily.  Please purchase the following medications over the counter and take as directed: Zantac 150 mg every 12 hours as needed  Discontinue omeprazole.  Follow up with Dr Hilarie Fredrickson in 1 year.  If you are age 71 or older, your body mass index should be between 23-30. Your Body mass index is 34.61 kg/m. If this is out of the aforementioned range listed, please consider follow up with your Primary Care Provider.  If you are age 48 or younger, your body mass index should be between 19-25. Your Body mass index is 34.61 kg/m. If this is out of the aformentioned range listed, please consider follow up with your Primary Care Provider.

## 2017-09-21 ENCOUNTER — Encounter: Payer: Self-pay | Admitting: Internal Medicine

## 2017-09-21 NOTE — Progress Notes (Signed)
   Subjective:    Patient ID: Kayla Maddox, female    DOB: Oct 16, 1945, 72 y.o.   MRN: [id number]  HPI Kayla Maddox is a 72 year old female with a history of Crohn's colitis, GERD, adenomatous colon polyps who is here for follow-up.  She was last seen 1 year ago by Ellouise Newer, PA-C.  She is maintained on Lialda 4.8 g daily.  She also is using oral vitamin B12 supplementation.  She states she is doing well.  She is having 1-2 formed to loose bowel movements daily.  No blood in her stool or melena.  No abdominal pain.  Reflux is rarely an issue and for this reason she is using omeprazole more on an as-needed basis which she states is quite rare.  She is having reflux symptoms if she drinks too much coffee or eats chocolate.  No issues with dysphagia or odynophagia, nausea or vomiting.  Appetite has been good.  Labs were checked several months ago in April by primary care.  Her creatinine was normal at 0.77.  Liver enzymes were normal with AST 15, ALT 16, alkaline phosphatase 56 and total bilirubin 0.6.  B12 normal when checked less than 1 year ago at 924  Last colonoscopy was 08/22/2016.  This revealed a 6 mm cecal polyp, 3 a sending colon polyps from 4 to 7 mm in size, 4 transverse colon polyps from 3 to 7 mm in size, a 5 mm sigmoid polyp.  All polyps were removed.  Multiple diverticula in the sigmoid and descending.  Internal hemorrhoids.  No endoscopic evidence of active colitis.  Pathology showed sessile serrated polyps and hyperplastic polyps.   Review of Systems As per HPI, otherwise negative  Current Medications, Allergies, Past Medical History, Past Surgical History, Family History and Social History were reviewed in [facility] record.     Objective:   Physical Exam BP 114/64   Pulse 73   Ht 5' 5"  (1.651 m)   Wt 208 lb (94.3 kg)   BMI 34.61 kg/m  Constitutional: Well-developed and well-nourished. No distress. HEENT: Normocephalic and atraumatic.   Conjunctivae are normal.  No scleral icterus. Neck: Neck supple. Trachea midline. Cardiovascular: Normal rate, regular rhythm and intact distal pulses. No M/R/G Pulmonary/chest: Effort normal and breath sounds normal. No wheezing, rales or rhonchi. Abdominal: Soft, nontender, nondistended. Bowel sounds active throughout. There are no masses palpable. No hepatosplenomegaly. Extremities: no clubbing, cyanosis, or edema Neurological: Alert and oriented to person place and time. Skin: Skin is warm and dry. Psychiatric: Normal mood and affect. Behavior is normal.     Assessment & Plan:  72 year old female with a history of Crohn's colitis, GERD, adenomatous colon polyps who is here for follow-up.    1.  Crohn's colitis --in clinical and endoscopic remission.  Continue Lialda 4.8 g daily  2.  History of sessile serrated polyps --surveillance colonoscopy recommended in June 2021  3.  GERD --intermittent and for this reason I do not think she needs to use PPI intermittently.  Instead I recommended we try ranitidine 150 mg every 12 hours as needed for heartburn.  If heartburn more frequent she should notify me.  Otherwise discontinue omeprazole.  Annual follow-up, sooner if needed 15 minutes spent with the patient today. Greater than 50% was spent in counseling and coordination of care with the patient

## 2017-09-29 ENCOUNTER — Other Ambulatory Visit: Payer: Self-pay | Admitting: Internal Medicine

## 2017-10-03 ENCOUNTER — Encounter: Payer: Self-pay | Admitting: Internal Medicine

## 2017-10-04 NOTE — Telephone Encounter (Signed)
Is this okay to refill? 

## 2017-10-06 MED ORDER — BENZONATATE 200 MG PO CAPS: 200.0000 mg | ORAL_CAPSULE | Freq: Three times a day (TID) | ORAL | 1 refills | Status: DC | PRN

## 2017-11-28 NOTE — Progress Notes (Addendum)
Subjective:   Kayla Maddox is a 72 y.o. female who presents for Medicare Annual (Subsequent) preventive examination.  Review of Systems:  No ROS.  Medicare Wellness Visit. Additional risk factors are reflected in the social history.  Cardiac Risk Factors include: advanced age (>82mn, >>63women);dyslipidemia;hypertension;obesity (BMI >30kg/m2) Sleep patterns: feels rested on waking, gets up 1 times nightly to void and sleeps 7 hours nightly.    Home Safety/Smoke Alarms: Feels safe in home. Smoke alarms in place.  Living environment; residence and Firearm Safety: 1-story house/ trailer, no firearms Lives with husband, no needs for DME, good support system. Seat Belt Safety/Bike Helmet: Wears seat belt.      Objective:     Vitals: BP 127/82   Pulse 63   Resp 17   Ht 5' 5"  (1.651 m)   Wt 213 lb (96.6 kg)   SpO2 98%   BMI 35.45 kg/m   Body mass index is 35.45 kg/m.  Advanced Directives 11/29/2017 11/27/2016 08/22/2016 08/07/2013  Does Patient Have a Medical Advance Directive? Yes Yes Yes Patient does not have advance directive  Type of Advance Directive [place] will [place] will Living will;Healthcare Power of Attorney -  Copy of HOrdin Chart? No - copy requested No - copy requested - -    Tobacco Social History   Tobacco Use  Smoking Status Former Smoker  . Packs/day: 1.00  . Years: 20.00  . Pack years: 20.00  . Types: Cigarettes  . Last attempt to quit: 03/13/1981  . Years since quitting: 36.7  Smokeless Tobacco Never Used     Counseling given: Not Answered  Past Medical History:  Diagnosis Date  . Allergic rhinitis   . Allergy   . Anxiety   . Arthritis   . Cataract    bilat removed   . Chronic colitis   . Crohn's colitis ([place]   . Diverticulosis   . Fundic gland polyps of stomach, benign   . Gallbladder polyp 2017  . Gastric heterotopia   . GERD (gastroesophageal reflux  disease)   . Hyperlipidemia   . Hyperplastic colon polyp   . Hypertension   . Internal hemorrhoids   . Obesity   . Serrated adenoma of colon   . Sleep apnea   . Tubular adenoma of colon    Past Surgical History:  Procedure Laterality Date  . BLADDER SUSPENSION  2011  . cataract surgery Bilateral 2017  . COLONOSCOPY    . FOOT SURGERY Left 2005  . NASAL SINUS SURGERY  2012  . POLYPECTOMY    . [place] . UPPER GASTROINTESTINAL ENDOSCOPY     Family History  Problem Relation Age of Onset  . Diabetes Father   . Hypertension Father   . Heart disease Father   . Hypertension Mother   . Heart disease Mother   . Colon cancer Neg Hx   . Esophageal cancer Neg Hx   . Rectal cancer Neg Hx   . Stomach cancer Neg Hx   . Colon polyps Neg Hx    Social History   Socioeconomic History  . Marital status: Married    Spouse name: Not on file  . Number of children: 1  . Years of education: Not on file  . Highest education level: Not on file  Occupational History  . Occupation: oSurveyor, mining [employer]  Social Needs  . Financial resource strain: Not hard  at all  . Food insecurity:    Worry: Never true    Inability: Never true  . Transportation needs:    Medical: No    Non-medical: No  Tobacco Use  . Smoking status: Former Smoker    Packs/day: 1.00    Years: 20.00    Pack years: 20.00    Types: Cigarettes    Last attempt to quit: 03/13/1981    Years since quitting: 36.7  . Smokeless tobacco: Never Used  Substance and Sexual Activity  . Alcohol use: No  . Drug use: No  . Sexual activity: Not Currently  Lifestyle  . Physical activity:    Days per week: 0 days    Minutes per session: 0 min  . Stress: Not at all  Relationships  . Social connections:    Talks on phone: More than three times a week    Gets together: More than three times a week    Attends religious service: 1 to 4 times per year    Active member of club or organization: Yes    Attends  meetings of clubs or organizations: More than 4 times per year    Relationship status: Married  Other Topics Concern  . Not on file  Social History Narrative   No exercise      Retiring in June 2017    Outpatient Encounter Medications as of 11/29/2017  Medication Sig  . ALPRAZolam (XANAX) 0.25 MG tablet Take 1 tablet (0.25 mg total) by mouth 2 (two) times daily as needed for anxiety.  . Ascorbic Acid (VITAMIN C) 1000 MG tablet Take 1,000 mg by mouth daily.  . benzonatate (TESSALON) 200 MG capsule Take 1 capsule (200 mg total) by mouth 3 (three) times daily as needed for cough.  . Biotin 5000 MCG CAPS Take 5,000 capsules by mouth daily.  Marland Kitchen buPROPion (WELLBUTRIN XL) 150 MG 24 hr tablet Take 1 tablet (150 mg total) by mouth daily.  . Coenzyme Q10 (CO Q-10) 200 MG CAPS Take 200 mg by mouth.  . escitalopram (LEXAPRO) 10 MG tablet TAKE ONE TABLET BY MOUTH DAILY  . ezetimibe (ZETIA) 10 MG tablet Take 1 tablet (10 mg total) by mouth daily.  . fluticasone (FLONASE) 50 MCG/ACT nasal spray INHALE 2 SPRAYS IN EACH NOSTRIL EVERY DAY  . fluticasone (FLONASE) 50 MCG/ACT nasal spray INHALE 2 SPRAYS IN EACH NOSTRIL EVERY DAY  . hydrochlorothiazide (HYDRODIURIL) 25 MG tablet Take 1 tablet (25 mg total) by mouth daily.  . mesalamine (LIALDA) 1.2 g EC tablet TAKE 4 TABLETS EVERY DAY WITH BREAKFAST  . mirabegron ER (MYRBETRIQ) 25 MG TB24 tablet Take 1 tablet (25 mg total) by mouth daily.  . ranitidine (ZANTAC) 150 MG capsule Take 150 mg by mouth 2 (two) times daily.  . vitamin B-12 (CYANOCOBALAMIN) 1000 MCG tablet Take 1 tablet (1,000 mcg total) by mouth daily.   No facility-administered encounter medications on file as of 11/29/2017.     Activities of Daily Living In your present state of health, do you have any difficulty performing the following activities: 11/29/2017  Hearing? N  Vision? N  Difficulty concentrating or making decisions? N  Walking or climbing stairs? N  Dressing or bathing? N  Doing  errands, shopping? N  Preparing Food and eating ? N  Using the Toilet? N  In the past six months, have you accidently leaked urine? N  Do you have problems with loss of bowel control? N  Managing your Medications? N  Managing your  Finances? N  Housekeeping or managing your Housekeeping? N  Some recent data might be hidden    Patient Care Team: Binnie Rail, MD as PCP - General (Internal Medicine) Pyrtle, Lajuan Lines, MD as Consulting Physician (Gastroenterology) Ernestine Conrad, MD as Referring Physician (Otolaryngology) Servando Salina, MD as Consulting Physician (Obstetrics and Gynecology) Bjorn Loser, MD as Consulting Physician (Urology)    Assessment:   This is a routine wellness examination for Tela. Physical assessment deferred to PCP.   Exercise Activities and Dietary recommendations Current Exercise Habits: The patient does not participate in regular exercise at present, Exercise limited by: None identified  Diet (meal preparation, eat out, water intake, caffeinated beverages, dairy products, fruits and vegetables): in general, a "healthy" diet  , well balanced   Reviewed heart healthy diet. Encouraged patient to increase daily water and healthy fluid intake.  Goals    . Patient Stated     Maintain current health status. Enjoy life and family.    . Take 30 minutes in the afternoon just for myself     To enjoy quite, peace and time just for myself.       Fall Risk Fall Risk  11/29/2017 11/27/2016 04/14/2016 10/28/2013  Falls in the past year? No No No No  Risk for fall due to : - Impaired balance/gait - -    Depression Screen PHQ 2/9 Scores 11/29/2017 11/27/2016 04/14/2016 10/28/2013  PHQ - 2 Score 1 2 0 0  PHQ- 9 Score 2 6 - -     Cognitive Function MMSE - Mini Mental State Exam 11/27/2016  Orientation to time 5  Orientation to Place 5  Registration 3  Attention/ Calculation 5  Recall 3  Language- name 2 objects 2  Language- repeat 1  Language-  follow 3 step command 3  Language- read & follow direction 1  Write a sentence 1  Copy design 1  Total score 30       Ad8 score reviewed for issues:  Issues making decisions: no  Less interest in hobbies / activities: no  Repeats questions, stories (family complaining): no  Trouble using ordinary gadgets (microwave, computer, phone):no  Forgets the month or year: no  Mismanaging finances: no  Remembering appts: no  Daily problems with thinking and/or memory: no Ad8 score is= 0  Immunization History  Administered Date(s) Administered  . Influenza Split 12/11/2012  . Influenza Whole 01/17/2010  . Influenza, High Dose Seasonal PF 11/27/2016  . Influenza,inj,Quad PF,6+ Mos 01/10/2014  . Influenza-Unspecified 12/12/2014  . Pneumococcal Conjugate-13 10/28/2013  . Pneumococcal Polysaccharide-23 01/11/2010, 05/04/2015  . Tdap 10/28/2013  . Zoster 03/14/2011   Screening Tests Health Maintenance  Topic Date Due  . DEXA SCAN  10/29/2015  . INFLUENZA VACCINE  10/11/2017  . MAMMOGRAM  03/21/2018  . COLONOSCOPY  08/23/2019  . TETANUS/TDAP  10/29/2023  . Hepatitis C Screening  Completed  . PNA vac Low Risk Adult  Completed      Plan:     Continue doing brain stimulating activities (puzzles, reading, adult coloring books, staying active) to keep memory sharp.   Continue to eat heart healthy diet (full of fruits, vegetables, whole grains, lean protein, water--limit salt, fat, and sugar intake) and increase physical activity as tolerated.  I have personally reviewed and noted the following in the patient's chart:   . Medical and social history . Use of alcohol, tobacco or illicit drugs  . Current medications and supplements . Functional ability and status . Nutritional status .  Physical activity . Advanced directives . List of other physicians . Vitals . Screenings to include cognitive, depression, and falls . Referrals and appointments  In addition, I have  reviewed and discussed with patient certain preventive protocols, quality metrics, and best practice recommendations. A written personalized care plan for preventive services as well as general preventive health recommendations were provided to patient.     Michiel Cowboy, RN  11/29/2017    Medical screening examination/treatment/procedure(s) were performed by non-physician practitioner and as supervising physician I was immediately available for consultation/collaboration. I agree with above. Binnie Rail, MD

## 2017-11-29 ENCOUNTER — Ambulatory Visit (INDEPENDENT_AMBULATORY_CARE_PROVIDER_SITE_OTHER): Payer: Medicare Other | Admitting: *Deleted

## 2017-11-29 VITALS — BP 127/82 | HR 63 | Resp 17 | Ht 65.0 in | Wt 213.0 lb

## 2017-11-29 DIAGNOSIS — Z23 Encounter for immunization: Secondary | ICD-10-CM | POA: Diagnosis not present

## 2017-11-29 DIAGNOSIS — Z0000 Encounter for general adult medical examination without abnormal findings: Secondary | ICD-10-CM | POA: Diagnosis not present

## 2017-11-29 NOTE — Patient Instructions (Addendum)
Continue doing brain stimulating activities (puzzles, reading, adult coloring books, staying active) to keep memory sharp.   Continue to eat heart healthy diet (full of fruits, vegetables, whole grains, lean protein, water--limit salt, fat, and sugar intake) and increase physical activity as tolerated.  Kayla Maddox , Thank you for taking time to come for your Medicare Wellness Visit. I appreciate your ongoing commitment to your health goals. Please review the following plan we discussed and let me know if I can assist you in the future.   These are the goals we discussed: Goals    . Patient Stated     Maintain current health status. Enjoy life and family.    . Take 30 minutes in the afternoon just for myself     To enjoy quite, peace and time just for myself.       This is a list of the screening recommended for you and due dates:  Health Maintenance  Topic Date Due  . DEXA scan (bone density measurement)  10/29/2015  . Flu Shot  10/11/2017  . Mammogram  03/21/2018  . Colon Cancer Screening  08/23/2019  . Tetanus Vaccine  10/29/2023  .  Hepatitis C: One time screening is recommended by Center for Disease Control  (CDC) for  adults born from [birth year] through 1963.   Completed  . Pneumonia vaccines  Completed    Influenza Virus Vaccine injection What is this medicine? INFLUENZA VIRUS VACCINE (in floo EN zuh VAHY ruhs vak SEEN) helps to reduce the risk of getting influenza also known as the flu. The vaccine only helps protect you against some strains of the flu. This medicine may be used for other purposes; ask your health care provider or pharmacist if you have questions. COMMON BRAND NAME(S): Afluria, Agriflu, Alfuria, FLUAD, Fluarix, Fluarix Quadrivalent, Flublok, Flublok Quadrivalent, FLUCELVAX, Flulaval, Fluvirin, Fluzone, Fluzone High-Dose, Fluzone Intradermal What should I tell my health care provider before I take this medicine? They need to know if you have any of these  conditions: -bleeding disorder like hemophilia -fever or infection -Guillain-Barre syndrome or other neurological problems -immune system problems -infection with the human immunodeficiency virus (HIV) or AIDS -low blood platelet counts -multiple sclerosis -an unusual or allergic reaction to influenza virus vaccine, latex, other medicines, foods, dyes, or preservatives. Different brands of vaccines contain different allergens. Some may contain latex or eggs. Talk to your doctor about your allergies to make sure that you get the right vaccine. -pregnant or trying to get pregnant -breast-feeding How should I use this medicine? This vaccine is for injection into a muscle or under the skin. It is given by a health care professional. A copy of Vaccine Information Statements will be given before each vaccination. Read this sheet carefully each time. The sheet may change frequently. Talk to your healthcare provider to see which vaccines are right for you. Some vaccines should not be used in all age groups. Overdosage: If you think you have taken too much of this medicine contact a poison control center or emergency room at once. NOTE: This medicine is only for you. Do not share this medicine with others. What if I miss a dose? This does not apply. What may interact with this medicine? -chemotherapy or radiation therapy -medicines that lower your immune system like etanercept, anakinra, infliximab, and adalimumab -medicines that treat or prevent blood clots like warfarin -phenytoin -steroid medicines like prednisone or cortisone -theophylline -vaccines This list may not describe all possible interactions. Give your health care provider  a list of all the medicines, herbs, non-prescription drugs, or dietary supplements you use. Also tell them if you smoke, drink alcohol, or use illegal drugs. Some items may interact with your medicine. What should I watch for while using this medicine? Report any  side effects that do not go away within 3 days to your doctor or health care professional. Call your health care provider if any unusual symptoms occur within 6 weeks of receiving this vaccine. You may still catch the flu, but the illness is not usually as bad. You cannot get the flu from the vaccine. The vaccine will not protect against colds or other illnesses that may cause fever. The vaccine is needed every year. What side effects may I notice from receiving this medicine? Side effects that you should report to your doctor or health care professional as soon as possible: -allergic reactions like skin rash, itching or hives, swelling of the face, lips, or tongue Side effects that usually do not require medical attention (report to your doctor or health care professional if they continue or are bothersome): -fever -headache -muscle aches and pains -pain, tenderness, redness, or swelling at the injection site -tiredness This list may not describe all possible side effects. Call your doctor for medical advice about side effects. You may report side effects to FDA at 1-800-FDA-1088. Where should I keep my medicine? The vaccine will be given by a health care professional in a clinic, pharmacy, doctor's office, or other health care setting. You will not be given vaccine doses to store at home. NOTE: This sheet is a summary. It may not cover all possible information. If you have questions about this medicine, talk to your doctor, pharmacist, or health care provider.  2018 Elsevier/Gold Standard (2014-09-18 10:07:28)

## 2018-01-01 ENCOUNTER — Other Ambulatory Visit: Payer: Self-pay | Admitting: Internal Medicine

## 2018-01-09 NOTE — Patient Instructions (Addendum)
  Tests ordered today. Your results will be released to [place] (or called to you) after review, usually within 72hours after test completion. If any changes need to be made, you will be notified at that same time.   Medications reviewed and updated.  Changes include :  none   Your prescription(s) have been submitted to your pharmacy. Please take as directed and contact our office if you believe you are having problem(s) with the medication(s).   Please followup in 6 months

## 2018-01-09 NOTE — Progress Notes (Signed)
Subjective:    Patient ID: Kayla Maddox, female    DOB: Oct 07, 1945, 72 y.o.   MRN: [id number]  HPI The patient is here for follow up.  Hypertension: She is taking her medication daily. She is compliant with a low sodium diet.  She denies chest pain, palpitations, edema, shortness of breath and regular headaches. She is not exercising regularly.  She does not monitor her blood pressure at home.    Prediabetes:  She is compliant with a low sugar/carbohydrate diet.  She is not exercising regularly.  Depression: She is taking her medication daily as prescribed. She denies any side effects from the medication. She feels her depression is well controlled and she is happy with her current dose of medication.   Anxiety: She is taking her medication daily as prescribed. She denies any side effects from the medication. She feels her anxiety is well controlled and she is happy with her current dose of medication.    Medications and allergies reviewed with patient and updated if appropriate.  Patient Active Problem List   Diagnosis Date Noted  . Obesity (BMI 30-39.9) 07/10/2017  . Esophageal spasm 01/08/2017  . Overactive bladder 01/07/2017  . B12 deficiency 11/26/2016  . Chest pain 05/24/2016  . RLQ discomfort 05/24/2016  . Prediabetes 05/06/2015  . Fatty liver 05/04/2015  . Sensation of feeling hot 02/16/2015  . Obesity 11/13/2014  . Dizziness 06/12/2014  . [place] 04/29/2014  . Back pain with sciatica 11/06/2013  . Anxiety and depression 10/28/2013  . Crohn's colitis ([place]) 10/21/2013  . Adenomatous colon polyp 10/21/2013  . Gastroesophageal reflux disease without esophagitis 10/21/2013  . Hyperlipidemia 02/10/2010  . Essential hypertension 02/10/2010  . Asthma 02/10/2010  . OSA (obstructive sleep apnea) 02/10/2010  . DYSPNEA 02/10/2010  . Upper airway cough syndrome 02/09/2010    Current Outpatient Medications on File Prior to Visit  Medication Sig Dispense Refill  .  ALPRAZolam (XANAX) 0.25 MG tablet Take 1 tablet (0.25 mg total) by mouth 2 (two) times daily as needed for anxiety. 60 tablet 0  . Ascorbic Acid (VITAMIN C) 1000 MG tablet Take 1,000 mg by mouth daily.    . benzonatate (TESSALON) 200 MG capsule Take 1 capsule (200 mg total) by mouth 3 (three) times daily as needed for cough. 30 capsule 1  . Biotin 5000 MCG CAPS Take 5,000 capsules by mouth daily.    Marland Kitchen buPROPion (WELLBUTRIN XL) 150 MG 24 hr tablet Take 1 tablet (150 mg total) by mouth daily. Annual appt is due must see provider for future refills 30 tablet 0  . Coenzyme Q10 (CO Q-10) 200 MG CAPS Take 200 mg by mouth.    . escitalopram (LEXAPRO) 10 MG tablet TAKE ONE TABLET BY MOUTH DAILY 30 tablet 5  . ezetimibe (ZETIA) 10 MG tablet Take 1 tablet (10 mg total) by mouth daily. 90 tablet 3  . fluticasone (FLONASE) 50 MCG/ACT nasal spray INHALE 2 SPRAYS IN EACH NOSTRIL EVERY DAY 16 g 3  . hydrochlorothiazide (HYDRODIURIL) 25 MG tablet Take 1 tablet (25 mg total) by mouth daily. 90 tablet 3  . mesalamine (LIALDA) 1.2 g EC tablet TAKE 4 TABLETS EVERY DAY WITH BREAKFAST 360 tablet 2  . mirabegron ER (MYRBETRIQ) 25 MG TB24 tablet Take 1 tablet (25 mg total) by mouth daily. 90 tablet 3  . ranitidine (ZANTAC) 150 MG capsule Take 150 mg by mouth 2 (two) times daily.    . vitamin B-12 (CYANOCOBALAMIN) 1000 MCG tablet Take 1  tablet (1,000 mcg total) by mouth daily.     No current facility-administered medications on file prior to visit.     Past Medical History:  Diagnosis Date  . Allergic rhinitis   . Allergy   . Anxiety   . Arthritis   . Cataract    bilat removed   . Chronic colitis   . Crohn's colitis ([place])   . Diverticulosis   . Fundic gland polyps of stomach, benign   . Gallbladder polyp 2017  . Gastric heterotopia   . GERD (gastroesophageal reflux disease)   . Hyperlipidemia   . Hyperplastic colon polyp   . Hypertension   . Internal hemorrhoids   . Obesity   . Serrated adenoma of colon    . Sleep apnea   . Tubular adenoma of colon     Past Surgical History:  Procedure Laterality Date  . BLADDER SUSPENSION  2011  . cataract surgery Bilateral 2017  . COLONOSCOPY    . FOOT SURGERY Left 2005  . NASAL SINUS SURGERY  2012  . POLYPECTOMY    . [place]  . UPPER GASTROINTESTINAL ENDOSCOPY      Social History   Socioeconomic History  . Marital status: Married    Spouse name: Not on file  . Number of children: 1  . Years of education: Not on file  . Highest education level: Not on file  Occupational History  . 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 Financial resource strain: Not hard at all  . Food insecurity:    Worry: Never true    Inability: Never true  . Transportation needs:    Medical: No    Non-medical: No  Tobacco Use  . Smoking status: Former Smoker    Packs/day: 1.00    Years: 20.00    Pack years: 20.00    Types: Cigarettes    Last attempt to quit: 03/13/1981    Years since quitting: 36.8  . Smokeless tobacco: Never Used  Substance and Sexual Activity  . Alcohol use: No  . Drug use: No  . Sexual activity: Not Currently  Lifestyle  . Physical activity:    Days per week: 0 days    Minutes per session: 0 min  . Stress: Not at all  Relationships  . Social connections:    Talks on phone: More than three times a week    Gets together: More than three times a week    Attends religious service: 1 to 4 times per year    Active member of club or organization: Yes    Attends meetings of clubs or organizations: More than 4 times per year    Relationship status: Married  Other Topics Concern  . Not on file  Social History Narrative   No exercise      Retiring in June 2017    Family History  Problem Relation Age of Onset  . Diabetes Father   . Hypertension Father   . Heart disease Father   . Hypertension Mother   . Heart disease Mother   . Colon cancer Neg Hx   . Esophageal cancer Neg Hx   . Rectal  cancer Neg Hx   . Stomach cancer Neg Hx   . Colon polyps Neg Hx     Review of Systems  Constitutional: Negative for chills and fever.  Respiratory: Positive for cough (chronic). Negative for shortness of breath and wheezing.   Cardiovascular: Positive  for leg swelling (mild). Negative for chest pain and palpitations.  Neurological: Positive for dizziness (occ) and light-headedness (occ). Negative for headaches.       Objective:   Vitals:   01/10/18 1053  BP: 124/72  Pulse: 81  Resp: 16  Temp: 98.9 F (37.2 C)  SpO2: 95%   BP Readings from Last 3 Encounters:  01/10/18 124/72  11/29/17 127/82  09/20/17 114/64   Wt Readings from Last 3 Encounters:  01/10/18 212 lb (96.2 kg)  11/29/17 213 lb (96.6 kg)  09/20/17 208 lb (94.3 kg)   Body mass index is 35.28 kg/m.   Physical Exam    Constitutional: Appears well-developed and well-nourished. No distress.  HENT:  Head: Normocephalic and atraumatic.  Neck: Neck supple. No tracheal deviation present. No thyromegaly present.  No cervical lymphadenopathy Cardiovascular: Normal rate, regular rhythm and normal heart sounds.   No murmur heard. No carotid bruit .  No edema Pulmonary/Chest: Effort normal and breath sounds normal. No respiratory distress. No has no wheezes. No rales.  Skin: Skin is warm and dry. Not diaphoretic.  Psychiatric: Normal mood and affect. Behavior is normal.      Assessment & Plan:    See Problem List for Assessment and Plan of chronic medical problems.

## 2018-01-10 ENCOUNTER — Encounter: Payer: Self-pay | Admitting: Internal Medicine

## 2018-01-10 ENCOUNTER — Ambulatory Visit: Payer: Medicare Other | Admitting: Internal Medicine

## 2018-01-10 ENCOUNTER — Other Ambulatory Visit (INDEPENDENT_AMBULATORY_CARE_PROVIDER_SITE_OTHER): Payer: Medicare Other

## 2018-01-10 VITALS — BP 124/72 | HR 81 | Temp 98.9°F | Resp 16 | Ht 65.0 in | Wt 212.0 lb

## 2018-01-10 DIAGNOSIS — E782 Mixed hyperlipidemia: Secondary | ICD-10-CM | POA: Diagnosis not present

## 2018-01-10 DIAGNOSIS — R7303 Prediabetes: Secondary | ICD-10-CM

## 2018-01-10 DIAGNOSIS — I10 Essential (primary) hypertension: Secondary | ICD-10-CM

## 2018-01-10 DIAGNOSIS — F329 Major depressive disorder, single episode, unspecified: Secondary | ICD-10-CM

## 2018-01-10 DIAGNOSIS — F419 Anxiety disorder, unspecified: Secondary | ICD-10-CM

## 2018-01-10 LAB — COMPREHENSIVE METABOLIC PANEL
ALBUMIN: 4.3 g/dL (ref 3.5–5.2)
ALT: 21 U/L (ref 0–35)
AST: 21 U/L (ref 0–37)
Alkaline Phosphatase: 57 U/L (ref 39–117)
BUN: 12 mg/dL (ref 6–23)
CALCIUM: 9.3 mg/dL (ref 8.4–10.5)
CO2: 35 meq/L — AB (ref 19–32)
Chloride: 100 mEq/L (ref 96–112)
Creatinine, Ser: 0.89 mg/dL (ref 0.40–1.20)
GFR: 66.51 mL/min (ref >60.00)
Glucose, Bld: 103 mg/dL — ABNORMAL HIGH (ref 70–99)
POTASSIUM: 3.9 meq/L (ref 3.5–5.1)
SODIUM: 140 meq/L (ref 135–145)
Total Bilirubin: 0.6 mg/dL (ref 0.2–1.2)
Total Protein: 7.1 g/dL (ref 6.0–8.3)

## 2018-01-10 LAB — LDL CHOLESTEROL, DIRECT: LDL DIRECT: 152 mg/dL

## 2018-01-10 LAB — LIPID PANEL
Cholesterol: 223 mg/dL — ABNORMAL HIGH (ref 0–200)
HDL: 53.7 mg/dL (ref >39.00)
NONHDL: 169.38
TRIGLYCERIDES: 214 mg/dL — AB (ref 0.0–149.0)
Total CHOL/HDL Ratio: 4
VLDL: 42.8 mg/dL — ABNORMAL HIGH (ref 0.0–40.0)

## 2018-01-10 LAB — HEMOGLOBIN A1C: HEMOGLOBIN A1C: 5.9 % (ref 4.6–6.5)

## 2018-01-10 MED ORDER — EZETIMIBE 10 MG PO TABS: 10.0000 mg | ORAL_TABLET | Freq: Every day | ORAL | 1 refills | Status: DC

## 2018-01-10 MED ORDER — BUPROPION HCL ER (XL) 150 MG PO TB24: 150.0000 mg | ORAL_TABLET | Freq: Every day | ORAL | 1 refills | Status: DC

## 2018-01-10 MED ORDER — ALPRAZOLAM 0.25 MG PO TABS: 0.2500 mg | ORAL_TABLET | Freq: Two times a day (BID) | ORAL | 0 refills | Status: DC | PRN

## 2018-01-10 NOTE — Assessment & Plan Note (Signed)
Check lipid panel  Continue daily statin Regular exercise and healthy diet encouraged  

## 2018-01-10 NOTE — Assessment & Plan Note (Signed)
BP well controlled Current regimen effective and well tolerated Continue current medications at current doses cmp  

## 2018-01-10 NOTE — Assessment & Plan Note (Signed)
Controlled, stable Continue current dose of medication - wellbutrin daily and xanax only as needed Refills today

## 2018-01-10 NOTE — Assessment & Plan Note (Signed)
Check a1c Low sugar / carb diet Stressed regular exercise   

## 2018-01-12 ENCOUNTER — Encounter: Payer: Self-pay | Admitting: Internal Medicine

## 2018-03-19 ENCOUNTER — Other Ambulatory Visit: Payer: Self-pay | Admitting: Internal Medicine

## 2018-04-05 LAB — HM MAMMOGRAPHY

## 2018-04-11 ENCOUNTER — Encounter: Payer: Self-pay | Admitting: Internal Medicine

## 2018-05-20 MED ORDER — MESALAMINE ER 0.375 G PO CP24: 1500.0000 mg | ORAL_CAPSULE | Freq: Every day | ORAL | 1 refills | Status: DC

## 2018-05-20 NOTE — Telephone Encounter (Signed)
She has listed allergy to Sulfa Thus okay to try apriso 1.5 g daily to replace Lialda After changing if she has change or develops symptoms of recurrent colitis she should let me know ASAP

## 2018-05-20 NOTE — Addendum Note (Signed)
Addended by: Larina Bras on: 05/20/2018 09:35 AM   Modules accepted: Orders

## 2018-07-15 NOTE — Progress Notes (Signed)
Virtual Visit via Video Note  I connected with Kayla Maddox on 07/16/18 at 11:00 AM EDT by a video enabled telemedicine application and verified that I am speaking with the correct person using two identifiers.   I discussed the limitations of evaluation and management by telemedicine and the availability of in person appointments. The patient expressed understanding and agreed to proceed.  The patient is currently at home and I am in the office.    No referring provider.    History of Present Illness: She is here for follow up of her chronic medical conditions.   She is not exercising regularly.    Hypertension: She is taking her medication daily. She is compliant with a low sodium diet.  She denies chest pain, palpitations, edema, shortness of breath and regular headaches. She does not monitor her blood pressure at home.    Depression: She is taking her medication daily as prescribed. She denies any side effects from the medication. She feels her depression is well controlled and she is happy with her current dose of medication.   Anxiety: She is taking her medication daily as prescribed. She denies any side effects from the medication. She feels her anxiety is well controlled and she is happy with her current dose of medication.   Prediabetes:  She is compliant with a low sugar/carbohydrate diet.  She is not exercising regularly.  Hyperlipidemia: She is taking her medication daily. She is compliant with a low fat/cholesterol diet. She denies myalgias.     Review of Systems  Constitutional: Negative for chills and fever.  Respiratory: Positive for cough (with allergies). Negative for shortness of breath and wheezing.   Cardiovascular: Negative for chest pain, palpitations and leg swelling.  Neurological: Negative for headaches.       Occ lightheadedness - not related to eating, drinking a good amount  Psychiatric/Behavioral: Positive for depression. The patient is nervous/anxious.       Social History   Socioeconomic History  . Marital status: Married    Spouse name: Not on file  . Number of children: 1  . Years of education: Not on file  . Highest education level: Not on file  Occupational History  . 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 Financial resource strain: Not hard at all  . Food insecurity:    Worry: Never true    Inability: Never true  . Transportation needs:    Medical: No    Non-medical: No  Tobacco Use  . Smoking status: Former Smoker    Packs/day: 1.00    Years: 20.00    Pack years: 20.00    Types: Cigarettes    Last attempt to quit: 03/13/1981    Years since quitting: 37.3  . Smokeless tobacco: Never Used  Substance and Sexual Activity  . Alcohol use: No  . Drug use: No  . Sexual activity: Not Currently  Lifestyle  . Physical activity:    Days per week: 0 days    Minutes per session: 0 min  . Stress: Not at all  Relationships  . Social connections:    Talks on phone: More than three times a week    Gets together: More than three times a week    Attends religious service: 1 to 4 times per year    Active member of club or organization: Yes    Attends meetings of clubs or organizations: More than 4 times per year  Relationship status: Married  Other Topics Concern  . Not on file  Social History Narrative   No exercise      Retiring in June 2017     Observations/Objective: Appears well in NAD Breathing normally Normal mood and affect  BP Readings from Last 3 Encounters:  01/10/18 124/72  11/29/17 127/82  09/20/17 114/64    Lab Results  Component Value Date   WBC 6.6 05/24/2016   HGB 15.3 (H) 05/24/2016   HCT 45.4 05/24/2016   PLT 246.0 05/24/2016   GLUCOSE 103 (H) 01/10/2018   CHOL 223 (H) 01/10/2018   TRIG 214.0 (H) 01/10/2018   HDL 53.70 01/10/2018   LDLDIRECT 152.0 01/10/2018   LDLCALC 116 (H) 07/10/2017   ALT 21 01/10/2018   AST 21 01/10/2018   NA 140 01/10/2018   K  3.9 01/10/2018   CL 100 01/10/2018   CREATININE 0.89 01/10/2018   BUN 12 01/10/2018   CO2 35 (H) 01/10/2018   TSH 2.29 07/10/2017   HGBA1C 5.9 01/10/2018    Assessment and Plan:  See Problem List for Assessment and Plan of chronic medical problems.   Follow Up Instructions:    I discussed the assessment and treatment plan with the patient. The patient was provided an opportunity to ask questions and all were answered. The patient agreed with the plan and demonstrated an understanding of the instructions.   The patient was advised to call back or seek an in-person evaluation if the symptoms worsen or if the condition fails to improve as anticipated.  Has an appointment for a physical in August  Binnie Rail, MD

## 2018-07-16 ENCOUNTER — Encounter: Payer: Self-pay | Admitting: Internal Medicine

## 2018-07-16 ENCOUNTER — Encounter: Payer: Medicare Other | Admitting: Internal Medicine

## 2018-07-16 ENCOUNTER — Ambulatory Visit (INDEPENDENT_AMBULATORY_CARE_PROVIDER_SITE_OTHER): Payer: Medicare Other | Admitting: Internal Medicine

## 2018-07-16 DIAGNOSIS — I10 Essential (primary) hypertension: Secondary | ICD-10-CM | POA: Diagnosis not present

## 2018-07-16 DIAGNOSIS — F419 Anxiety disorder, unspecified: Secondary | ICD-10-CM

## 2018-07-16 DIAGNOSIS — E782 Mixed hyperlipidemia: Secondary | ICD-10-CM

## 2018-07-16 DIAGNOSIS — R7303 Prediabetes: Secondary | ICD-10-CM | POA: Diagnosis not present

## 2018-07-16 DIAGNOSIS — F329 Major depressive disorder, single episode, unspecified: Secondary | ICD-10-CM

## 2018-07-16 MED ORDER — EZETIMIBE 10 MG PO TABS: 10.0000 mg | ORAL_TABLET | Freq: Every day | ORAL | 1 refills | Status: DC

## 2018-07-16 MED ORDER — HYDROCHLOROTHIAZIDE 25 MG PO TABS: 25.0000 mg | ORAL_TABLET | Freq: Every day | ORAL | 3 refills | Status: DC

## 2018-07-16 NOTE — Assessment & Plan Note (Signed)
Both anxiety and depression are controlled and stable Continue Lexapro 10 mg daily, Wellbutrin 150 mg daily and Xanax 0.25 mg as needed, which she does not take often  Follow-up in August as scheduled

## 2018-07-16 NOTE — Assessment & Plan Note (Signed)
Statin intolerant Continue Zetia Blood work in August

## 2018-07-16 NOTE — Assessment & Plan Note (Signed)
BP Readings from Last 3 Encounters:  01/10/18 124/72  11/29/17 127/82  09/20/17 114/64    BP well controlled Current regimen effective and well tolerated Continue current medications at current doses

## 2018-07-16 NOTE — Assessment & Plan Note (Signed)
Lab Results  Component Value Date   HGBA1C 5.9 01/10/2018   Sugars have been stable in the prediabetic range She is compliant with a low sugar/carbohydrate diet Not currently exercising We will follow-up in August and recheck A1c at that time

## 2018-08-26 ENCOUNTER — Other Ambulatory Visit: Payer: Self-pay | Admitting: Internal Medicine

## 2018-09-15 ENCOUNTER — Other Ambulatory Visit: Payer: Self-pay | Admitting: Internal Medicine

## 2018-10-03 ENCOUNTER — Telehealth: Payer: Self-pay | Admitting: Internal Medicine

## 2018-10-03 MED ORDER — MESALAMINE ER 0.375 G PO CP24: 1500.0000 mg | ORAL_CAPSULE | Freq: Every day | ORAL | 0 refills | Status: DC

## 2018-10-03 NOTE — Telephone Encounter (Signed)
Rx sent 

## 2018-10-03 NOTE — Telephone Encounter (Signed)
Pt requested a refill for mesalamine.  She is scheduled for an OV with Amy.

## 2018-10-16 NOTE — Progress Notes (Signed)
Subjective:    Patient ID: Kayla Maddox, female    DOB: 12-07-1945, 73 y.o.   MRN: [id number]  HPI She is here for a physical exam.   Right lateral foot pain:  She is still having right lateral foot pain.   Some shoes do help, but she just needs to wear them more consistently. The pain is intermittent and worse when she is more active.    Skin on the bottom of her feet hurt.  It is intermittent. It usually occurs at night - it will wake her up. She gets tingling in her legs 1/2 way down. It is also intermittent.  It is not severe.  She is taking her B12 but not daily.    Depression and anxiety have increased.  She wonders if the lexparo can be increased.     Chest tightness:  She has had some tightness in her chest over the past two months.  It occurs at rest and only lasts a few seconds.  She denies tightness when she is active.  She is not exercising regularly.  She does have increased stress.    Medications and allergies reviewed with patient and updated if appropriate.  Patient Active Problem List   Diagnosis Date Noted  . Obesity (BMI 30-39.9) 07/10/2017  . Esophageal spasm 01/08/2017  . Overactive bladder 01/07/2017  . B12 deficiency 11/26/2016  . Prediabetes 05/06/2015  . Fatty liver 05/04/2015  . Obesity 11/13/2014  . Dizziness 06/12/2014  . Flushing 04/29/2014  . Back pain with sciatica 11/06/2013  . Anxiety and depression 10/28/2013  . Crohn's colitis ([place]) 10/21/2013  . Adenomatous colon polyp 10/21/2013  . Gastroesophageal reflux disease without esophagitis 10/21/2013  . Hyperlipidemia 02/10/2010  . Essential hypertension 02/10/2010  . Asthma 02/10/2010  . OSA (obstructive sleep apnea) 02/10/2010  . DYSPNEA 02/10/2010  . Upper airway cough syndrome 02/09/2010    Current Outpatient Medications on File Prior to Visit  Medication Sig Dispense Refill  . ALPRAZolam (XANAX) 0.25 MG tablet Take 1 tablet (0.25 mg total) by mouth 2 (two) times daily as needed for  anxiety. 60 tablet 0  . Ascorbic Acid (VITAMIN C) 1000 MG tablet Take 1,000 mg by mouth daily.    . Biotin 5000 MCG CAPS Take 5,000 capsules by mouth daily.    Marland Kitchen buPROPion (WELLBUTRIN XL) 150 MG 24 hr tablet Take 1 tablet (150 mg total) by mouth daily. 90 tablet 1  . Coenzyme Q10 (CO Q-10) 200 MG CAPS Take 200 mg by mouth.    . ezetimibe (ZETIA) 10 MG tablet Take 1 tablet (10 mg total) by mouth daily. 90 tablet 1  . fluticasone (FLONASE) 50 MCG/ACT nasal spray INHALE 2 SPRAYS IN EACH NOSTRIL EVERY DAY 16 g 3  . hydrochlorothiazide (HYDRODIURIL) 25 MG tablet Take 1 tablet (25 mg total) by mouth daily. 90 tablet 3  . mesalamine (APRISO) 0.375 g 24 hr capsule Take 4 capsules (1.5 g total) by mouth daily. 120 capsule 0  . MYRBETRIQ 25 MG TB24 tablet TAKE ONE TABLET EVERY DAY 90 tablet 1  . vitamin B-12 (CYANOCOBALAMIN) 1000 MCG tablet Take 1 tablet (1,000 mcg total) by mouth daily.    . [DISCONTINUED] escitalopram (LEXAPRO) 10 MG tablet TAKE ONE TABLET BY MOUTH DAILY 30 tablet 0   No current facility-administered medications on file prior to visit.     Past Medical History:  Diagnosis Date  . Allergic rhinitis   . Allergy   . Anxiety   .  Arthritis   . Cataract    bilat removed   . Chronic colitis   . Crohn's colitis ([place])   . Diverticulosis   . Fundic gland polyps of stomach, benign   . Gallbladder polyp 2017  . Gastric heterotopia   . GERD (gastroesophageal reflux disease)   . Hyperlipidemia   . Hyperplastic colon polyp   . Hypertension   . Internal hemorrhoids   . Obesity   . Serrated adenoma of colon   . Sleep apnea   . Tubular adenoma of colon     Past Surgical History:  Procedure Laterality Date  . BLADDER SUSPENSION  2011  . cataract surgery Bilateral 2017  . COLONOSCOPY    . FOOT SURGERY Left 2005  . NASAL SINUS SURGERY  2012  . POLYPECTOMY    . [place]  . UPPER GASTROINTESTINAL ENDOSCOPY      Social History   Socioeconomic History  . Marital  status: Married    Spouse name: Not on file  . Number of children: 1  . Years of education: Not on file  . Highest education level: Not on file  Occupational History  . 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 Financial resource strain: Not hard at all  . Food insecurity    Worry: Never true    Inability: Never true  . Transportation needs    Medical: No    Non-medical: No  Tobacco Use  . Smoking status: Former Smoker    Packs/day: 1.00    Years: 20.00    Pack years: 20.00    Types: Cigarettes    Quit date: 03/13/1981    Years since quitting: 37.6  . Smokeless tobacco: Never Used  Substance and Sexual Activity  . Alcohol use: No  . Drug use: No  . Sexual activity: Not Currently  Lifestyle  . Physical activity    Days per week: 0 days    Minutes per session: 0 min  . Stress: Not at all  Relationships  . Social connections    Talks on phone: More than three times a week    Gets together: More than three times a week    Attends religious service: 1 to 4 times per year    Active member of club or organization: Yes    Attends meetings of clubs or organizations: More than 4 times per year    Relationship status: Married  Other Topics Concern  . Not on file  Social History Narrative   No exercise      Retiring in June 2017    Family History  Problem Relation Age of Onset  . Diabetes Father   . Hypertension Father   . Heart disease Father   . Hypertension Mother   . Heart disease Mother   . Colon cancer Neg Hx   . Esophageal cancer Neg Hx   . Rectal cancer Neg Hx   . Stomach cancer Neg Hx   . Colon polyps Neg Hx     Review of Systems  Constitutional: Negative for chills and fever.  Eyes: Negative for visual disturbance.  Respiratory: Positive for cough (chronic, stable). Negative for shortness of breath and wheezing.   Cardiovascular: Positive for chest pain (couple months, at rest, couple of seconds). Negative for palpitations and  leg swelling.  Gastrointestinal: Negative for abdominal pain, blood in stool, constipation, diarrhea and nausea.  Genitourinary: Negative for dysuria and hematuria.  Musculoskeletal: Positive for  arthralgias (mild arthritis).  Skin: Negative for color change and rash.  Neurological: Negative for dizziness, light-headedness, numbness and headaches.  Psychiatric/Behavioral: Positive for dysphoric mood. Negative for sleep disturbance. The patient is nervous/anxious.        Objective:   Vitals:   10/17/18 1033  BP: 132/64  Pulse: 79  Resp: 16  Temp: 97.6 F (36.4 C)  SpO2: 93%   Filed Weights   10/17/18 1033  Weight: 224 lb (101.6 kg)   Body mass index is 37.28 kg/m.  BP Readings from Last 3 Encounters:  10/17/18 132/64  01/10/18 124/72  11/29/17 127/82    Wt Readings from Last 3 Encounters:  10/17/18 224 lb (101.6 kg)  01/10/18 212 lb (96.2 kg)  11/29/17 213 lb (96.6 kg)     Physical Exam Constitutional: She appears well-developed and well-nourished. No distress.  HENT:  Head: Normocephalic and atraumatic.  Right Ear: External ear normal. Normal ear canal and TM Left Ear: External ear normal.  Normal ear canal and TM Mouth/Throat: Oropharynx is clear and moist.  Eyes: Conjunctivae and EOM are normal.  Neck: Neck supple. No tracheal deviation present. No thyromegaly present.  No carotid bruit  Cardiovascular: Normal rate, regular rhythm and normal heart sounds.   No murmur heard.  No edema. Pulmonary/Chest: Effort normal and breath sounds normal. No respiratory distress. She has no wheezes. She has no rales.  Breast: deferred   Abdominal: Soft. She exhibits no distension. There is no tenderness.  Lymphadenopathy: She has no cervical adenopathy.  Skin: Skin is warm and dry. She is not diaphoretic.  Psychiatric: She has a normal mood and affect. Her behavior is normal.        Assessment & Plan:   Physical exam: Screening blood work ordered Immunizations   Discussed shingrix, others up to date Colonoscopy   Up to date  Mammogram   Up to date  39  - Dr Garwin Brothers, Up to date  Dexa     Due  - will order for [facility]  Due - will schedule Exercise   none Weight  Encouraged weight loss Skin   no concerns Substance abuse   none  See Problem List for Assessment and Plan of chronic medical problems.   FU in 6 months

## 2018-10-16 NOTE — Patient Instructions (Addendum)
Tests ordered today. Your results will be released to [place] (or called to you) after review.  If any changes need to be made, you will be notified at that same time.  All other Health Maintenance issues reviewed.   All recommended immunizations and age-appropriate screenings are up-to-date or discussed.  No immunization administered today.   Medications reviewed and updated.  Changes include :   lexapro increase to 20 mg daily.    Your prescription(s) have been submitted to your pharmacy. Please take as directed and contact our office if you believe you are having problem(s) with the medication(s).  A bone density scan was ordered.   Please followup in 6 months    Health Maintenance, Female Adopting a healthy lifestyle and getting preventive care are important in promoting health and wellness. Ask your health care provider about:  The right schedule for you to have regular tests and exams.  Things you can do on your own to prevent diseases and keep yourself healthy. What should I know about diet, weight, and exercise? Eat a healthy diet   Eat a diet that includes plenty of vegetables, fruits, low-fat dairy products, and lean protein.  Do not eat a lot of foods that are high in solid fats, added sugars, or sodium. Maintain a healthy weight Body mass index (BMI) is used to identify weight problems. It estimates body fat based on height and weight. Your health care provider can help determine your BMI and help you achieve or maintain a healthy weight. Get regular exercise Get regular exercise. This is one of the most important things you can do for your health. Most adults should:  Exercise for at least 150 minutes each week. The exercise should increase your heart rate and make you sweat (moderate-intensity exercise).  Do strengthening exercises at least twice a week. This is in addition to the moderate-intensity exercise.  Spend less time sitting. Even light physical activity  can be beneficial. Watch cholesterol and blood lipids Have your blood tested for lipids and cholesterol at 71 years of age, then have this test every 5 years. Have your cholesterol levels checked more often if:  Your lipid or cholesterol levels are high.  You are older than 71 years of age.  You are at high risk for heart disease. What should I know about cancer screening? Depending on your health history and family history, you may need to have cancer screening at various ages. This may include screening for:  Breast cancer.  Cervical cancer.  Colorectal cancer.  Skin cancer.  Lung cancer. What should I know about heart disease, diabetes, and high blood pressure? Blood pressure and heart disease  High blood pressure causes heart disease and increases the risk of stroke. This is more likely to develop in people who have high blood pressure readings, are of African descent, or are overweight.  Have your blood pressure checked: ? Every 3-5 years if you are 46-44 years of age. ? Every year if you are 59 years old or older. Diabetes Have regular diabetes screenings. This checks your fasting blood sugar level. Have the screening done:  Once every three years after age 30 if you are at a normal weight and have a low risk for diabetes.  More often and at a younger age if you are overweight or have a high risk for diabetes. What should I know about preventing infection? Hepatitis B If you have a higher risk for hepatitis B, you should be screened for this virus.  Talk with your health care provider to find out if you are at risk for hepatitis B infection. Hepatitis C Testing is recommended for:  Everyone born from [birth year] through 1963.  Anyone with known risk factors for hepatitis C. Sexually transmitted infections (STIs)  Get screened for STIs, including gonorrhea and chlamydia, if: ? You are sexually active and are younger than 71 years of age. ? You are older than 71 years of  age and your health care provider tells you that you are at risk for this type of infection. ? Your sexual activity has changed since you were last screened, and you are at increased risk for chlamydia or gonorrhea. Ask your health care provider if you are at risk.  Ask your health care provider about whether you are at high risk for HIV. Your health care provider may recommend a prescription medicine to help prevent HIV infection. If you choose to take medicine to prevent HIV, you should first get tested for HIV. You should then be tested every 3 months for as long as you are taking the medicine. Pregnancy  If you are about to stop having your period (premenopausal) and you may become pregnant, seek counseling before you get pregnant.  Take 400 to 800 micrograms (mcg) of folic acid every day if you become pregnant.  Ask for birth control (contraception) if you want to prevent pregnancy. Osteoporosis and menopause Osteoporosis is a disease in which the bones lose minerals and strength with aging. This can result in bone fractures. If you are 47 years old or older, or if you are at risk for osteoporosis and fractures, ask your health care provider if you should:  Be screened for bone loss.  Take a calcium or vitamin D supplement to lower your risk of fractures.  Be given hormone replacement therapy (HRT) to treat symptoms of menopause. Follow these instructions at home: Lifestyle  Do not use any products that contain nicotine or tobacco, such as cigarettes, e-cigarettes, and chewing tobacco. If you need help quitting, ask your health care provider.  Do not use street drugs.  Do not share needles.  Ask your health care provider for help if you need support or information about quitting drugs. Alcohol use  Do not drink alcohol if: ? Your health care provider tells you not to drink. ? You are pregnant, may be pregnant, or are planning to become pregnant.  If you drink alcohol: ? Limit  how much you use to 0-1 drink a day. ? Limit intake if you are breastfeeding.  Be aware of how much alcohol is in your drink. In the [place] one drink equals one 12 oz bottle of beer (355 mL), one 5 oz glass of wine (148 mL), or one 1 oz glass of hard liquor (44 mL). General instructions  Schedule regular health, dental, and eye exams.  Stay current with your vaccines.  Tell your health care provider if: ? You often feel depressed. ? You have ever been abused or do not feel safe at home. Summary  Adopting a healthy lifestyle and getting preventive care are important in promoting health and wellness.  Follow your health care provider's instructions about healthy diet, exercising, and getting tested or screened for diseases.  Follow your health care provider's instructions on monitoring your cholesterol and blood pressure. This information is not intended to replace advice given to you by your health care provider. Make sure you discuss any questions you have with your health care provider. Document Released:  09/12/2010 Document Revised: 02/20/2018 Document Reviewed: 02/20/2018 Elsevier Patient Education  [place].

## 2018-10-17 ENCOUNTER — Encounter: Payer: Self-pay | Admitting: Internal Medicine

## 2018-10-17 ENCOUNTER — Ambulatory Visit (INDEPENDENT_AMBULATORY_CARE_PROVIDER_SITE_OTHER): Payer: Medicare Other | Admitting: Internal Medicine

## 2018-10-17 ENCOUNTER — Other Ambulatory Visit (INDEPENDENT_AMBULATORY_CARE_PROVIDER_SITE_OTHER): Payer: Medicare Other

## 2018-10-17 ENCOUNTER — Other Ambulatory Visit: Payer: Self-pay

## 2018-10-17 VITALS — BP 132/64 | HR 79 | Temp 97.6°F | Resp 16 | Ht 65.0 in | Wt 224.0 lb

## 2018-10-17 DIAGNOSIS — R05 Cough: Secondary | ICD-10-CM

## 2018-10-17 DIAGNOSIS — F419 Anxiety disorder, unspecified: Secondary | ICD-10-CM | POA: Diagnosis not present

## 2018-10-17 DIAGNOSIS — I10 Essential (primary) hypertension: Secondary | ICD-10-CM | POA: Diagnosis not present

## 2018-10-17 DIAGNOSIS — Z0000 Encounter for general adult medical examination without abnormal findings: Secondary | ICD-10-CM

## 2018-10-17 DIAGNOSIS — E782 Mixed hyperlipidemia: Secondary | ICD-10-CM | POA: Diagnosis not present

## 2018-10-17 DIAGNOSIS — E538 Deficiency of other specified B group vitamins: Secondary | ICD-10-CM

## 2018-10-17 DIAGNOSIS — K5010 Crohn's disease of large intestine without complications: Secondary | ICD-10-CM | POA: Diagnosis not present

## 2018-10-17 DIAGNOSIS — K219 Gastro-esophageal reflux disease without esophagitis: Secondary | ICD-10-CM | POA: Diagnosis not present

## 2018-10-17 DIAGNOSIS — R0789 Other chest pain: Secondary | ICD-10-CM | POA: Diagnosis not present

## 2018-10-17 DIAGNOSIS — R7303 Prediabetes: Secondary | ICD-10-CM

## 2018-10-17 DIAGNOSIS — G629 Polyneuropathy, unspecified: Secondary | ICD-10-CM

## 2018-10-17 DIAGNOSIS — N3281 Overactive bladder: Secondary | ICD-10-CM

## 2018-10-17 DIAGNOSIS — F329 Major depressive disorder, single episode, unspecified: Secondary | ICD-10-CM

## 2018-10-17 DIAGNOSIS — F32A Depression, unspecified: Secondary | ICD-10-CM

## 2018-10-17 DIAGNOSIS — E669 Obesity, unspecified: Secondary | ICD-10-CM

## 2018-10-17 DIAGNOSIS — M79671 Pain in right foot: Secondary | ICD-10-CM

## 2018-10-17 DIAGNOSIS — R058 Other specified cough: Secondary | ICD-10-CM

## 2018-10-17 DIAGNOSIS — Z13820 Encounter for screening for osteoporosis: Secondary | ICD-10-CM

## 2018-10-17 DIAGNOSIS — G4733 Obstructive sleep apnea (adult) (pediatric): Secondary | ICD-10-CM

## 2018-10-17 LAB — CBC WITH DIFFERENTIAL/PLATELET
Basophils Absolute: 0 10*3/uL (ref 0.0–0.1)
Basophils Relative: 0.4 % (ref 0.0–3.0)
Eosinophils Absolute: 0.1 10*3/uL (ref 0.0–0.7)
Eosinophils Relative: 1.6 % (ref 0.0–5.0)
HCT: 44.5 % (ref 36.0–46.0)
Hemoglobin: 15 g/dL (ref 12.0–15.0)
Lymphocytes Relative: 27.5 % (ref 12.0–46.0)
Lymphs Abs: 2.2 10*3/uL (ref 0.7–4.0)
MCHC: 33.6 g/dL (ref 30.0–36.0)
MCV: 89.4 fl (ref 78.0–100.0)
Monocytes Absolute: 0.6 10*3/uL (ref 0.1–1.0)
Monocytes Relative: 7 % (ref 3.0–12.0)
Neutro Abs: 5 10*3/uL (ref 1.4–7.7)
Neutrophils Relative %: 63.5 % (ref 43.0–77.0)
Platelets: 267 10*3/uL (ref 150.0–400.0)
RBC: 4.98 Mil/uL (ref 3.87–5.11)
RDW: 13.3 % (ref 11.5–15.5)
WBC: 8 10*3/uL (ref 4.0–10.5)

## 2018-10-17 LAB — LIPID PANEL
Cholesterol: 217 mg/dL — ABNORMAL HIGH (ref 0–200)
HDL: 52.8 mg/dL (ref >39.00)
LDL Cholesterol: 126 mg/dL — ABNORMAL HIGH (ref 0–99)
NonHDL: 164.5
Total CHOL/HDL Ratio: 4
Triglycerides: 194 mg/dL — ABNORMAL HIGH (ref 0.0–149.0)
VLDL: 38.8 mg/dL (ref 0.0–40.0)

## 2018-10-17 LAB — COMPREHENSIVE METABOLIC PANEL
ALT: 19 U/L (ref 0–35)
AST: 17 U/L (ref 0–37)
Albumin: 4.3 g/dL (ref 3.5–5.2)
Alkaline Phosphatase: 58 U/L (ref 39–117)
BUN: 12 mg/dL (ref 6–23)
CO2: 33 mEq/L — ABNORMAL HIGH (ref 19–32)
Calcium: 9.3 mg/dL (ref 8.4–10.5)
Chloride: 100 mEq/L (ref 96–112)
Creatinine, Ser: 0.78 mg/dL (ref 0.40–1.20)
GFR: 72.71 mL/min (ref >60.00)
Glucose, Bld: 94 mg/dL (ref 70–99)
Potassium: 4.1 mEq/L (ref 3.5–5.1)
Sodium: 140 mEq/L (ref 135–145)
Total Bilirubin: 0.5 mg/dL (ref 0.2–1.2)
Total Protein: 7.3 g/dL (ref 6.0–8.3)

## 2018-10-17 LAB — VITAMIN D 25 HYDROXY (VIT D DEFICIENCY, FRACTURES): VITD: 29.03 ng/mL — ABNORMAL LOW (ref 30.00–100.00)

## 2018-10-17 LAB — HEMOGLOBIN A1C: Hgb A1c MFr Bld: 6 % (ref 4.6–6.5)

## 2018-10-17 LAB — VITAMIN B12: Vitamin B-12: 374 pg/mL (ref 211–911)

## 2018-10-17 LAB — TSH: TSH: 2.42 u[IU]/mL (ref 0.35–4.50)

## 2018-10-17 MED ORDER — ESCITALOPRAM OXALATE 20 MG PO TABS: 20.0000 mg | ORAL_TABLET | Freq: Every day | ORAL | 1 refills | Status: DC

## 2018-10-17 MED ORDER — BENZONATATE 200 MG PO CAPS: 200.0000 mg | ORAL_CAPSULE | Freq: Three times a day (TID) | ORAL | 1 refills | Status: DC | PRN

## 2018-10-17 MED ORDER — ALPRAZOLAM 0.25 MG PO TABS: 0.2500 mg | ORAL_TABLET | Freq: Two times a day (BID) | ORAL | 0 refills | Status: DC | PRN

## 2018-10-17 MED ORDER — EZETIMIBE 10 MG PO TABS: 10.0000 mg | ORAL_TABLET | Freq: Every day | ORAL | 1 refills | Status: DC

## 2018-10-17 MED ORDER — FLUTICASONE PROPIONATE 50 MCG/ACT NA SUSP: NASAL | 3 refills | Status: DC

## 2018-10-17 NOTE — Assessment & Plan Note (Addendum)
Not using cpap - got out of the habit of using it Stressed regular use

## 2018-10-17 NOTE — Assessment & Plan Note (Signed)
Increased anxiety and depression Increase lexapro to 20 mg daily Continue xanax as needed Discussed other ways of helping anxiety, depression

## 2018-10-17 NOTE — Assessment & Plan Note (Addendum)
Continue Zetia-tolerating Check lipid panel, CMP Encouraged healthy diet and more activity

## 2018-10-17 NOTE — Assessment & Plan Note (Signed)
Controlled, stable, except for some urgency Continue current dose of medication

## 2018-10-17 NOTE — Assessment & Plan Note (Signed)
Taking B12 but not daily Check level

## 2018-10-17 NOTE — Assessment & Plan Note (Signed)
Chronic cough Continue Tessalon as needed

## 2018-10-17 NOTE — Assessment & Plan Note (Signed)
Encourage weight loss She is not exercising regularly-discussed exercise may help with her anxiety and depression

## 2018-10-17 NOTE — Assessment & Plan Note (Signed)
Not taking anything No gerd tums prn

## 2018-10-17 NOTE — Assessment & Plan Note (Signed)
Symptoms in feet and lower legs consistent with possible neuropathy Taking B12, but not daily-we will check level Discussed medication-gabapentin, but symptoms are not bad enough to consider medication at this time Discussed neurology referral for possible EMG-she would like to hold off and just monitor

## 2018-10-17 NOTE — Assessment & Plan Note (Signed)
BP well controlled Current regimen effective and well tolerated Continue current medications at current doses cmp  

## 2018-10-17 NOTE — Assessment & Plan Note (Signed)
He has been experiencing some chest tightness over the past 2 months-occurring only at rest lasting a few seconds.  Denies any chest tightness with activity Sounds atypical for cardiac pain.  She did have a normal stress test 2 years ago Has had increased anxiety, which may be the cause Discussed options EKG today: Sinus rhythm at 66 bpm, normal EKG.  PACs and questionable old anterior infarct on EKG from 2016 no longer present. We decided to hold off on cardiology referral at this time, but if pain continues she will let me know so I can refer her Will increase anxiety medication

## 2018-10-17 NOTE — Assessment & Plan Note (Signed)
Following with Dr Hilarie Fredrickson On mesalamine Stable controlled

## 2018-10-17 NOTE — Assessment & Plan Note (Signed)
Check a1c Low sugar / carb diet Stressed regular exercise   

## 2018-10-23 ENCOUNTER — Other Ambulatory Visit: Payer: Medicare Other

## 2018-10-24 LAB — HM DEXA SCAN: HM Dexa Scan: -0.5

## 2018-10-28 ENCOUNTER — Encounter: Payer: Self-pay | Admitting: Internal Medicine

## 2018-10-28 ENCOUNTER — Ambulatory Visit (INDEPENDENT_AMBULATORY_CARE_PROVIDER_SITE_OTHER): Payer: Medicare Other | Admitting: Physician Assistant

## 2018-10-28 ENCOUNTER — Encounter: Payer: Self-pay | Admitting: Physician Assistant

## 2018-10-28 ENCOUNTER — Telehealth: Payer: Self-pay | Admitting: Internal Medicine

## 2018-10-28 VITALS — BP 110/70 | HR 78 | Ht 65.0 in | Wt 218.0 lb

## 2018-10-28 DIAGNOSIS — Z86010 Personal history of colonic polyps: Secondary | ICD-10-CM

## 2018-10-28 DIAGNOSIS — K50119 Crohn's disease of large intestine with unspecified complications: Secondary | ICD-10-CM | POA: Diagnosis not present

## 2018-10-28 MED ORDER — MESALAMINE ER 0.375 G PO CP24: 1500.0000 mg | ORAL_CAPSULE | Freq: Every day | ORAL | 4 refills | Status: DC

## 2018-10-28 NOTE — Progress Notes (Addendum)
Subjective:    Patient ID: Kayla Maddox, female    DOB: [birth year], 73 y.o.   MRN: [id number]  HPI Kayla Maddox is a pleasant 73 year old female, known to Dr. Hilarie Fredrickson who comes in today for annual follow-up with history of Crohn's colitis,  adenomatous colon polyps, and fatty liver. She was last seen here about 1 year ago.  She has been maintained on Apriso 0.375 mg, 4 tablets daily and has done well. She denies any current symptoms related to her Crohn's, denies any problems with abdominal pain or cramping, bowel movements have been normal, no melena or hematochezia.  She says her energy level is been a bit decreased but that is normal for her. She had recent labs done August 2020 with normal CBC, and c-Met.  B12 level was 374, and she is on chronic replacement. Last colonoscopy was done June 2018 with removal of 4 polyps, and path showing sessile serrated polyps and hyperplastic polyps.  There was no evidence of active colitis.  She is indicated for 3-year interval follow-up. Patient had also previously had some mild symptoms of GERD, she had been on ranitidine which she stopped several months ago and has been doing fine without any regular symptoms.  Review of Systems Pertinent positive and negative review of systems were noted in the above HPI section.  All other review of systems was otherwise negative.  Outpatient Encounter Medications as of 10/28/2018  Medication Sig  . ALPRAZolam (XANAX) 0.25 MG tablet Take 1 tablet (0.25 mg total) by mouth 2 (two) times daily as needed for anxiety.  . Ascorbic Acid (VITAMIN C) 1000 MG tablet Take 1,000 mg by mouth daily.  . benzonatate (TESSALON) 200 MG capsule Take 1 capsule (200 mg total) by mouth 3 (three) times daily as needed for cough.  . Biotin 5000 MCG CAPS Take 5,000 capsules by mouth daily.  Marland Kitchen buPROPion (WELLBUTRIN XL) 150 MG 24 hr tablet Take 1 tablet (150 mg total) by mouth daily.  . Coenzyme Q10 (CO Q-10) 200 MG CAPS Take 200 mg by mouth.  .  escitalopram (LEXAPRO) 20 MG tablet Take 1 tablet (20 mg total) by mouth daily.  Marland Kitchen ezetimibe (ZETIA) 10 MG tablet Take 1 tablet (10 mg total) by mouth daily.  . fluticasone (FLONASE) 50 MCG/ACT nasal spray INHALE 2 SPRAYS IN EACH NOSTRIL EVERY DAY  . hydrochlorothiazide (HYDRODIURIL) 25 MG tablet Take 1 tablet (25 mg total) by mouth daily.  . mesalamine (APRISO) 0.375 g 24 hr capsule Take 4 capsules (1.5 g total) by mouth daily. Take 4 every morning.  Marland Kitchen MYRBETRIQ 25 MG TB24 tablet TAKE ONE TABLET EVERY DAY  . vitamin B-12 (CYANOCOBALAMIN) 1000 MCG tablet Take 1 tablet (1,000 mcg total) by mouth daily.  . [DISCONTINUED] mesalamine (APRISO) 0.375 g 24 hr capsule Take 4 capsules (1.5 g total) by mouth daily.   No facility-administered encounter medications on file as of 10/28/2018.    Allergies  Allergen Reactions  . Simvastatin     Muscle weakness  . Amoxicillin Rash  . Clarithromycin Rash  . Doxycycline Rash  . Prednisone Rash  . Sulfonamide Derivatives Rash   Patient Active Problem List   Diagnosis Date Noted  . Foot pain, right 10/17/2018  . Neuropathy 10/17/2018  . Obesity (BMI 30-39.9) 07/10/2017  . Esophageal spasm 01/08/2017  . Overactive bladder 01/07/2017  . B12 deficiency 11/26/2016  . Prediabetes 05/06/2015  . Fatty liver 05/04/2015  . Dizziness 06/12/2014  . Chest tightness 04/29/2014  . Flushing 04/29/2014  .  Back pain with sciatica 11/06/2013  . Anxiety and depression 10/28/2013  . Crohn's colitis ([place]) 10/21/2013  . Adenomatous colon polyp 10/21/2013  . Gastroesophageal reflux disease without esophagitis 10/21/2013  . Hyperlipidemia 02/10/2010  . Essential hypertension 02/10/2010  . Asthma 02/10/2010  . OSA (obstructive sleep apnea) 02/10/2010  . DYSPNEA 02/10/2010  . Upper airway cough syndrome 02/09/2010   Social History   Socioeconomic History  . Marital status: Married    Spouse name: Not on file  . Number of children: 1  . Years of education: Not  on file  . Highest education level: Not on file  Occupational History  . 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 Financial resource strain: Not hard at all  . Food insecurity    Worry: Never true    Inability: Never true  . Transportation needs    Medical: No    Non-medical: No  Tobacco Use  . Smoking status: Former Smoker    Packs/day: 1.00    Years: 20.00    Pack years: 20.00    Types: Cigarettes    Quit date: 03/13/1981    Years since quitting: 37.6  . Smokeless tobacco: Never Used  Substance and Sexual Activity  . Alcohol use: No  . Drug use: No  . Sexual activity: Not Currently  Lifestyle  . Physical activity    Days per week: 0 days    Minutes per session: 0 min  . Stress: Not at all  Relationships  . Social connections    Talks on phone: More than three times a week    Gets together: More than three times a week    Attends religious service: 1 to 4 times per year    Active member of club or organization: Yes    Attends meetings of clubs or organizations: More than 4 times per year    Relationship status: Married  . Intimate partner violence    Fear of current or ex partner: No    Emotionally abused: No    Physically abused: No    Forced sexual activity: No  Other Topics Concern  . Not on file  Social History Narrative   No exercise      Retiring in June 2017    Kayla Maddox's family history includes Diabetes in her father; Heart disease in her father and mother; Hypertension in her father and mother.      Objective:    Vitals:   10/28/18 1004  BP: 110/70  Pulse: 78    Physical Exam; Well-developed well-nourished older white female in no acute distress.  Height, Weight, BMI  HEENT; nontraumatic normocephalic, EOMI, PE R LA, sclera anicteric. Oropharynx; not examined/wearing mask/COVID Neck; supple, no JVD Cardiovascular; regular rate and rhythm with S1-S2, no murmur rub or gallop Pulmonary; Clear bilaterally Abdomen;  soft, nontender, nondistended, no palpable mass or hepatosplenomegaly, bowel sounds are active Rectal; Not done today Skin; benign exam, no jaundice rash or appreciable lesions Extremities; no clubbing cyanosis or edema skin warm and dry Neuro/Psych; alert and oriented x4, grossly nonfocal mood and affect appropriate       Assessment & Plan:   #45 73 year old female with history of Crohn's colitis which is been in remission. Patient maintained on Apriso 1500 mg daily and doing well. Continue Apriso at current dose, refills sent   #2 history of sessile serrated colon polyps-up-to-date with surveillance, due for follow-up June 2021 #3 history of B12 deficiency-on oral replacement  therapy #4 fatty liver-recent normal LFTs.  Last ultrasound was done in 2018, consider repeat ultrasound at next follow-up.  Patient will follow-up with Dr. Hilarie Fredrickson, in 1 year or sooner as needed.   Kayla Maddox Arts PA-C 10/28/2018   Cc: Binnie Rail, MD   Addendum: Reviewed and agree with assessment and management plan. Pyrtle, Lajuan Lines, MD

## 2018-10-28 NOTE — Patient Instructions (Signed)
If you are age 71 or older, your body mass index should be between 23-30. Your Body mass index is 36.28 kg/m. If this is out of the aforementioned range listed, please consider follow up with your Primary Care Provider.  If you are age 84 or younger, your body mass index should be between 19-25. Your Body mass index is 36.28 kg/m. If this is out of the aformentioned range listed, please consider follow up with your Primary Care Provider.   We have sent the following medications to your pharmacy for you to pick up at your convenience: Apriso  Follow up with Dr. Hilarie Fredrickson in one year or sooner if needed.  Thank you for choosing me and [facility].   Amy Esterwood, PA-C

## 2018-10-28 NOTE — Telephone Encounter (Signed)
Patient has dropped off a renewal parking placard.  Form has been completed and placed in providers box to sign if approves.

## 2018-10-29 ENCOUNTER — Encounter: Payer: Self-pay | Admitting: Internal Medicine

## 2018-10-29 NOTE — Progress Notes (Signed)
Abstracted and sent to scan  

## 2018-10-29 NOTE — Telephone Encounter (Signed)
Form has been signed, Copy sent to scan.   Patient inform and original mailed to her.

## 2018-12-06 ENCOUNTER — Ambulatory Visit (INDEPENDENT_AMBULATORY_CARE_PROVIDER_SITE_OTHER): Payer: Medicare Other

## 2018-12-06 ENCOUNTER — Other Ambulatory Visit: Payer: Self-pay

## 2018-12-06 DIAGNOSIS — Z23 Encounter for immunization: Secondary | ICD-10-CM | POA: Diagnosis not present

## 2019-01-15 NOTE — Progress Notes (Addendum)
Subjective:   Kayla Maddox is a 73 y.o. female who presents for Medicare Annual (Subsequent) preventive examination. I connected with patient by a telephone and verified that I am speaking with the correct person using two identifiers. Patient stated full name and DOB. Patient gave permission to continue with telephonic visit. Patient's location was at home and Nurse's location was at [place] office. Participants during this visit included patient and nurse.  Review of Systems:   Cardiac Risk Factors include: advanced age (>31mn, >>35women);dyslipidemia;hypertension Sleep patterns: feels rested on waking, gets up 1-2 times nightly to void and sleeps 6-7 hours nightly.    Home Safety/Smoke Alarms: Feels safe in home. Smoke alarms in place.  Living environment; residence and Firearm Safety: 1-story house/ trailer. Lives with husband, no needs for DME, good support system Seat Belt Safety/Bike Helmet: Wears seat belt.     Objective:     Vitals: There were no vitals taken for this visit.  There is no height or weight on file to calculate BMI.  Advanced Directives 01/16/2019 11/29/2017 11/27/2016 08/22/2016 08/07/2013  Does Patient Have a Medical Advance Directive? Yes Yes Yes Yes Patient does not have advance directive  Type of Advance Directive [place] will;Healthcare Power of Attorney -  Copy of [place] Chart? No - copy requested No - copy requested No - copy requested - -    Tobacco Social History   Tobacco Use  Smoking Status Former Smoker  . Packs/day: 1.00  . Years: 20.00  . Pack years: 20.00  . Types: Cigarettes  . Quit date: 03/13/1981  . Years since quitting: 37.8  Smokeless Tobacco Never Used     Counseling given: Not Answered  Past Medical History:  Diagnosis Date  . Allergic rhinitis   . Allergy   . Anxiety   . Arthritis    . Cataract    bilat removed   . Chronic colitis   . Crohn's colitis ([place]   . Diverticulosis   . Fundic gland polyps of stomach, benign   . Gallbladder polyp 2017  . Gastric heterotopia   . GERD (gastroesophageal reflux disease)   . Hyperlipidemia   . Hyperplastic colon polyp   . Hypertension   . Internal hemorrhoids   . Obesity   . Serrated adenoma of colon   . Sleep apnea   . Tubular adenoma of colon    Past Surgical History:  Procedure Laterality Date  . BLADDER SUSPENSION  2011  . cataract surgery Bilateral 2017  . COLONOSCOPY    . FOOT SURGERY Left 2005  . NASAL SINUS SURGERY  2012  . [place]CTOMY    [place] UPPER GASTROINTESTINAL ENDOS[place]     Family History  Problem Relation Age of Onset  . Diabetes Father   . Hypertension Father   . Heart disease Father   . Hypertension Mother   . Heart disease Mother   . Colon cancer Neg Hx   . Esophageal cancer Neg Hx   . Rectal cancer Neg Hx   . Stomach cancer Neg Hx   . Colon polyps Neg Hx    Social History   Socioeconomic History  . Marital status: Married    Spouse name: Not on file  . Number of children: 1  . Years of education: Not on file  . Highest education level: Not on file  Occupational  History  . Occupation: office clerk/ retired    Fish farm manager: [employer]  Social Needs  . Financial resource strain: Not hard at all  . Food insecurity    Worry: Never true    Inability: Never true  . Transportation needs    Medical: No    Non-medical: No  Tobacco Use  . Smoking status: Former Smoker    Packs/day: 1.00    Years: 20.00    Pack years: 20.00    Types: Cigarettes    Quit date: 03/13/1981    Years since quitting: 37.8  . Smokeless tobacco: Never Used  Substance and Sexual Activity  . Alcohol use: No  . Drug use: No  . Sexual activity: Not Currently  Lifestyle  . Physical activity    Days per week: 0 days    Minutes per session: 0 min  . Stress: Only a little  Relationships  .  Social connections    Talks on phone: More than three times a week    Gets together: More than three times a week    Attends religious service: 1 to 4 times per year    Active member of club or organization: Yes    Attends meetings of clubs or organizations: More than 4 times per year    Relationship status: Married  Other Topics Concern  . Not on file  Social History Narrative   No exercise      Retiring in June 2017    Outpatient Encounter Medications as of 01/16/2019  Medication Sig  . ALPRAZolam (XANAX) 0.25 MG tablet Take 1 tablet (0.25 mg total) by mouth 2 (two) times daily as needed for anxiety.  . Ascorbic Acid (VITAMIN C) 1000 MG tablet Take 1,000 mg by mouth daily.  . benzonatate (TESSALON) 200 MG capsule Take 1 capsule (200 mg total) by mouth 3 (three) times daily as needed for cough.  . Biotin 5000 MCG CAPS Take 5,000 capsules by mouth daily.  . Coenzyme Q10 (CO Q-10) 200 MG CAPS Take 200 mg by mouth.  . escitalopram (LEXAPRO) 20 MG tablet Take 1 tablet (20 mg total) by mouth daily.  Marland Kitchen ezetimibe (ZETIA) 10 MG tablet Take 1 tablet (10 mg total) by mouth daily.  . fluticasone (FLONASE) 50 MCG/ACT nasal spray INHALE 2 SPRAYS IN EACH NOSTRIL EVERY DAY  . hydrochlorothiazide (HYDRODIURIL) 25 MG tablet Take 1 tablet (25 mg total) by mouth daily.  . mesalamine (APRISO) 0.375 g 24 hr capsule Take 4 capsules (1.5 g total) by mouth daily. Take 4 every morning.  Marland Kitchen MYRBETRIQ 25 MG TB24 tablet TAKE ONE TABLET EVERY DAY  . vitamin B-12 (CYANOCOBALAMIN) 1000 MCG tablet Take 1 tablet (1,000 mcg total) by mouth daily.  . [DISCONTINUED] buPROPion (WELLBUTRIN XL) 150 MG 24 hr tablet Take 1 tablet (150 mg total) by mouth daily. (Patient not taking: Reported on 01/16/2019)   No facility-administered encounter medications on file as of 01/16/2019.     Activities of Daily Living In your present state of health, do you have any difficulty performing the following activities: 01/16/2019  Hearing?  N  Vision? N  Difficulty concentrating or making decisions? N  Walking or climbing stairs? N  Dressing or bathing? N  Doing errands, shopping? N  Preparing Food and eating ? N  Using the Toilet? N  In the past six months, have you accidently leaked urine? N  Do you have problems with loss of bowel control? N  Managing your Medications? N  Managing your  Finances? N  Housekeeping or managing your Housekeeping? N  Some recent data might be hidden    Patient Care Team: Binnie Rail, MD as PCP - General (Internal Medicine) Pyrtle, Lajuan Lines, MD as Consulting Physician (Gastroenterology) Ernestine Conrad, MD as Referring Physician (Otolaryngology) Servando Salina, MD as Consulting Physician (Obstetrics and Gynecology) Bjorn Loser, MD as Consulting Physician (Urology)    Assessment:   This is a routine wellness examination for Kayla Maddox. Physical assessment deferred to PCP.   Exercise Activities and Dietary recommendations Current Exercise Habits: The patient does not participate in regular exercise at present, Exercise limited by: None identified  Diet (meal preparation, eat out, water intake, caffeinated beverages, dairy products, fruits and vegetables): in general, a "healthy" diet  , well balanced   Reviewed heart healthy diet. Encouraged patient to increase daily water and healthy fluid intake.  Goals    . Patient Stated     Maintain current health status. Enjoy life and family.    . Take 30 minutes in the afternoon just for myself     To enjoy quite, peace and time just for myself.       Fall Risk Fall Risk  01/16/2019 11/29/2017 11/27/2016 04/14/2016 10/28/2013  Falls in the past year? 0 No No No No  Number falls in past yr: 0 - - - -  Injury with Fall? 0 - - - -  Risk for fall due to : Impaired balance/gait - Impaired balance/gait - -   Is the patient's home free of loose throw rugs in walkways, pet beds, electrical cords, etc?   yes      Grab bars in the  bathroom? yes      Handrails on the stairs?   yes      Adequate lighting?   yes   Depression Screen PHQ 2/9 Scores 01/16/2019 11/29/2017 11/27/2016 04/14/2016  PHQ - 2 Score 0 1 2 0  PHQ- 9 Score 0 2 6 -     Cognitive Function MMSE - Mini Mental State Exam 11/27/2016  Orientation to time 5  Orientation to Place 5  Registration 3  Attention/ Calculation 5  Recall 3  Language- name 2 objects 2  Language- repeat 1  Language- follow 3 step command 3  Language- read & follow direction 1  Write a sentence 1  Copy design 1  Total score 30       Ad8 score reviewed for issues:  Issues making decisions: no  Less interest in hobbies / activities: no  Repeats questions, stories (family complaining): no  Trouble using ordinary gadgets (microwave, computer, phone):no  Forgets the month or year: no  Mismanaging finances: no  Remembering appts: no  Daily problems with thinking and/or memory: no Ad8 score is= 0  Immunization History  Administered Date(s) Administered  . Fluad Quad(high Dose 65+) 12/06/2018  . Influenza Split 12/11/2012  . Influenza Whole 01/17/2010  . Influenza, High Dose Seasonal PF 11/27/2016, 11/29/2017  . Influenza,inj,Quad PF,6+ Mos 01/10/2014  . Influenza-Unspecified 12/12/2014  . Pneumococcal Conjugate-13 10/28/2013  . Pneumococcal Polysaccharide-23 01/11/2010, 05/04/2015  . Tdap 10/28/2013  . Zoster 03/14/2011   Screening Tests Health Maintenance  Topic Date Due  . MAMMOGRAM  04/06/2019  . COLONOSCOPY  08/23/2019  . DEXA SCAN  10/24/2023  . TETANUS/TDAP  10/29/2023  . INFLUENZA VACCINE  Completed  . Hepatitis C Screening  Completed  . PNA vac Low Risk Adult  Completed      Plan:    Reviewed health  maintenance screenings with patient today and relevant education, vaccines, and/or referrals were provided.   Continue to eat heart healthy diet (full of fruits, vegetables, whole grains, lean protein, water--limit salt, fat, and sugar intake)  and increase physical activity as tolerated.  Continue doing brain stimulating activities (puzzles, reading, adult coloring books, staying active) to keep memory sharp.   I have personally reviewed and noted the following in the patient's chart:   . Medical and social history . Use of alcohol, tobacco or illicit drugs  . Current medications and supplements . Functional ability and status . Nutritional status . Physical activity . Advanced directives . List of other physicians . Screenings to include cognitive, depression, and falls . Referrals and appointments  In addition, I have reviewed and discussed with patient certain preventive protocols, quality metrics, and best practice recommendations. A written personalized care plan for preventive services as well as general preventive health recommendations were provided to patient.     Michiel Cowboy, RN  01/16/2019   Medical screening examination/treatment/procedure(s) were performed by non-physician practitioner and as supervising physician I was immediately available for consultation/collaboration. I agree with above. Binnie Rail, MD

## 2019-01-16 ENCOUNTER — Ambulatory Visit (INDEPENDENT_AMBULATORY_CARE_PROVIDER_SITE_OTHER): Payer: Medicare Other | Admitting: *Deleted

## 2019-01-16 DIAGNOSIS — Z0000 Encounter for general adult medical examination without abnormal findings: Secondary | ICD-10-CM

## 2019-03-12 DIAGNOSIS — H02831 Dermatochalasis of right upper eyelid: Secondary | ICD-10-CM | POA: Insufficient documentation

## 2019-03-12 DIAGNOSIS — H5340 Unspecified visual field defects: Secondary | ICD-10-CM | POA: Insufficient documentation

## 2019-03-12 DIAGNOSIS — H02403 Unspecified ptosis of bilateral eyelids: Secondary | ICD-10-CM | POA: Insufficient documentation

## 2019-04-07 ENCOUNTER — Other Ambulatory Visit: Payer: Self-pay | Admitting: Internal Medicine

## 2019-04-20 NOTE — Progress Notes (Signed)
Subjective:    Patient ID: Kayla Maddox, female    DOB: 1945-11-13, 74 y.o.   MRN: [id number]  HPI The patient is here for follow up of their chronic medical problems, including hypertension, hyperlipidemia, anxiety and depression and prediabetes  She is taking her medications daily as prescribed.  She is not exercising regularly.  Overall she is doing well and has no concerns.  Medications and allergies reviewed with patient and updated if appropriate.  Patient Active Problem List   Diagnosis Date Noted  . Foot pain, right 10/17/2018  . Neuropathy 10/17/2018  . Obesity (BMI 30-39.9) 07/10/2017  . Esophageal spasm 01/08/2017  . Overactive bladder 01/07/2017  . B12 deficiency 11/26/2016  . Prediabetes 05/06/2015  . Fatty liver 05/04/2015  . Dizziness 06/12/2014  . Chest tightness 04/29/2014  . Flushing 04/29/2014  . Back pain with sciatica 11/06/2013  . Anxiety and depression 10/28/2013  . Crohn's colitis ([place]) 10/21/2013  . Adenomatous colon polyp 10/21/2013  . Gastroesophageal reflux disease without esophagitis 10/21/2013  . Hyperlipidemia 02/10/2010  . Essential hypertension 02/10/2010  . Asthma 02/10/2010  . OSA (obstructive sleep apnea) 02/10/2010  . DYSPNEA 02/10/2010  . Upper airway cough syndrome 02/09/2010    Current Outpatient Medications on File Prior to Visit  Medication Sig Dispense Refill  . ALPRAZolam (XANAX) 0.25 MG tablet Take 1 tablet (0.25 mg total) by mouth 2 (two) times daily as needed for anxiety. 60 tablet 0  . Ascorbic Acid (VITAMIN C) 1000 MG tablet Take 1,000 mg by mouth daily.    . benzonatate (TESSALON) 200 MG capsule Take 1 capsule (200 mg total) by mouth 3 (three) times daily as needed for cough. 90 capsule 1  . Biotin 5000 MCG CAPS Take 5,000 capsules by mouth daily.    . Coenzyme Q10 (CO Q-10) 200 MG CAPS Take 200 mg by mouth.    . escitalopram (LEXAPRO) 20 MG tablet Take 1 tablet (20 mg total) by mouth daily. 90 tablet 1  . ezetimibe  (ZETIA) 10 MG tablet Take 1 tablet (10 mg total) by mouth daily. 90 tablet 1  . fluticasone (FLONASE) 50 MCG/ACT nasal spray INHALE 2 SPRAYS IN EACH NOSTRIL EVERY DAY 16 g 3  . hydrochlorothiazide (HYDRODIURIL) 25 MG tablet Take 1 tablet (25 mg total) by mouth daily. 90 tablet 3  . mesalamine (APRISO) 0.375 g 24 hr capsule Take 4 capsules (1.5 g total) by mouth daily. Take 4 every morning. 360 capsule 4  . MYRBETRIQ 25 MG TB24 tablet TAKE ONE TABLET EVERY DAY 90 tablet 0  . vitamin B-12 (CYANOCOBALAMIN) 1000 MCG tablet Take 1 tablet (1,000 mcg total) by mouth daily.     No current facility-administered medications on file prior to visit.    Past Medical History:  Diagnosis Date  . Allergic rhinitis   . Allergy   . Anxiety   . Arthritis   . Cataract    bilat removed   . Chronic colitis   . Crohn's colitis ([place])   . Diverticulosis   . Fundic gland polyps of stomach, benign   . Gallbladder polyp 2017  . Gastric heterotopia   . GERD (gastroesophageal reflux disease)   . Hyperlipidemia   . Hyperplastic colon polyp   . Hypertension   . Internal hemorrhoids   . Obesity   . Serrated adenoma of colon   . Sleep apnea   . Tubular adenoma of colon     Past Surgical History:  Procedure Laterality Date  .  BLADDER SUSPENSION  2011  . cataract surgery Bilateral 2017  . COLONOSCOPY    . FOOT SURGERY Left 2005  . NASAL SINUS SURGERY  2012  . POLYPECTOMY    . [place]  . UPPER GASTROINTESTINAL ENDOSCOPY      Social History   Socioeconomic History  . Marital status: Married    Spouse name: Not on file  . Number of children: 1  . Years of education: Not on file  . Highest education level: Not on file  Occupational History  . Occupation: office clerk/ retired    Fish farm manager: [employer]  Tobacco Use  . Smoking status: Former Smoker    Packs/day: 1.00    Years: 20.00    Pack years: 20.00    Types: Cigarettes    Quit date: 03/13/1981    Years since quitting: 38.1  .  Smokeless tobacco: Never Used  Substance and Sexual Activity  . Alcohol use: No  . Drug use: No  . Sexual activity: Not Currently  Other Topics Concern  . Not on file  Social History Narrative   No exercise      Retiring in June 2017   Social Determinants of Health   Financial Resource Strain:   . Difficulty of Paying Living Expenses: Not on file  Food Insecurity:   . Worried About Charity fundraiser in the Last Year: Not on file  . Ran Out of Food in the Last Year: Not on file  Transportation Needs:   . Lack of Transportation (Medical): Not on file  . Lack of Transportation (Non-Medical): Not on file  Physical Activity:   . Days of Exercise per Week: Not on file  . Minutes of Exercise per Session: Not on file  Stress: No Stress Concern Present  . Feeling of Stress : Only a little  Social Connections:   . Frequency of Communication with Friends and Family: Not on file  . Frequency of Social Gatherings with Friends and Family: Not on file  . Attends Religious Services: Not on file  . Active Member of Clubs or Organizations: Not on file  . Attends Archivist Meetings: Not on file  . Marital Status: Not on file    Family History  Problem Relation Age of Onset  . Diabetes Father   . Hypertension Father   . Heart disease Father   . Hypertension Mother   . Heart disease Mother   . Colon cancer Neg Hx   . Esophageal cancer Neg Hx   . Rectal cancer Neg Hx   . Stomach cancer Neg Hx   . Colon polyps Neg Hx     Review of Systems  Constitutional: Negative for chills and fever.  Respiratory: Positive for cough. Negative for shortness of breath and wheezing.   Cardiovascular: Positive for chest pain (occ). Negative for palpitations and leg swelling.  Neurological: Negative for light-headedness and headaches.       Objective:   Vitals:   04/21/19 1108  BP: (!) 142/80  Pulse: 75  Resp: 16  Temp: 98.5 F (36.9 C)  SpO2: 96%   BP Readings from Last 3  Encounters:  04/21/19 (!) 142/80  10/28/18 110/70  10/17/18 132/64   Wt Readings from Last 3 Encounters:  04/21/19 226 lb (102.5 kg)  10/28/18 218 lb (98.9 kg)  10/17/18 224 lb (101.6 kg)   Body mass index is 37.61 kg/m.   Physical Exam    Constitutional: Appears well-developed and well-nourished. No  distress.  HENT:  Head: Normocephalic and atraumatic.  Neck: Neck supple. No tracheal deviation present. No thyromegaly present.  No cervical lymphadenopathy Cardiovascular: Normal rate, regular rhythm and normal heart sounds.   No murmur heard. No carotid bruit .  No edema Pulmonary/Chest: Effort normal and breath sounds normal. No respiratory distress. No has no wheezes. No rales.  Skin: Skin is warm and dry. Not diaphoretic.  Psychiatric: Normal mood and affect. Behavior is normal.      Assessment & Plan:    See Problem List for Assessment and Plan of chronic medical problems.    This visit occurred during the SARS-CoV-2 public health emergency.  Safety protocols were in place, including screening questions prior to the visit, additional usage of staff PPE, and extensive cleaning of exam room while observing appropriate contact time as indicated for disinfecting solutions.

## 2019-04-20 NOTE — Patient Instructions (Addendum)
  Blood work was ordered.     Medications reviewed and updated.  Changes include :   Increase myrbetriq to 50 mg daily  Your prescription(s) have been submitted to your pharmacy. Please take as directed and contact our office if you believe you are having problem(s) with the medication(s).    Please followup in 6 months

## 2019-04-21 ENCOUNTER — Other Ambulatory Visit: Payer: Self-pay

## 2019-04-21 ENCOUNTER — Ambulatory Visit (INDEPENDENT_AMBULATORY_CARE_PROVIDER_SITE_OTHER): Payer: Medicare PPO | Admitting: Internal Medicine

## 2019-04-21 ENCOUNTER — Encounter: Payer: Self-pay | Admitting: Internal Medicine

## 2019-04-21 VITALS — BP 142/80 | HR 75 | Temp 98.5°F | Resp 16 | Ht 65.0 in | Wt 226.0 lb

## 2019-04-21 DIAGNOSIS — F419 Anxiety disorder, unspecified: Secondary | ICD-10-CM | POA: Diagnosis not present

## 2019-04-21 DIAGNOSIS — R7303 Prediabetes: Secondary | ICD-10-CM

## 2019-04-21 DIAGNOSIS — F329 Major depressive disorder, single episode, unspecified: Secondary | ICD-10-CM

## 2019-04-21 DIAGNOSIS — E782 Mixed hyperlipidemia: Secondary | ICD-10-CM

## 2019-04-21 DIAGNOSIS — N3281 Overactive bladder: Secondary | ICD-10-CM

## 2019-04-21 DIAGNOSIS — I10 Essential (primary) hypertension: Secondary | ICD-10-CM | POA: Diagnosis not present

## 2019-04-21 LAB — LIPID PANEL
Cholesterol: 226 mg/dL — ABNORMAL HIGH (ref 0–200)
HDL: 50.7 mg/dL (ref >39.00)
NonHDL: 175.52
Total CHOL/HDL Ratio: 4
Triglycerides: 243 mg/dL — ABNORMAL HIGH (ref 0.0–149.0)
VLDL: 48.6 mg/dL — ABNORMAL HIGH (ref 0.0–40.0)

## 2019-04-21 LAB — LDL CHOLESTEROL, DIRECT: Direct LDL: 156 mg/dL

## 2019-04-21 LAB — COMPREHENSIVE METABOLIC PANEL
ALT: 20 U/L (ref 0–35)
AST: 17 U/L (ref 0–37)
Albumin: 4.2 g/dL (ref 3.5–5.2)
Alkaline Phosphatase: 66 U/L (ref 39–117)
BUN: 15 mg/dL (ref 6–23)
CO2: 34 mEq/L — ABNORMAL HIGH (ref 19–32)
Calcium: 9.9 mg/dL (ref 8.4–10.5)
Chloride: 101 mEq/L (ref 96–112)
Creatinine, Ser: 0.95 mg/dL (ref 0.40–1.20)
GFR: 57.83 mL/min — ABNORMAL LOW (ref >60.00)
Glucose, Bld: 81 mg/dL (ref 70–99)
Potassium: 3.6 mEq/L (ref 3.5–5.1)
Sodium: 141 mEq/L (ref 135–145)
Total Bilirubin: 0.5 mg/dL (ref 0.2–1.2)
Total Protein: 7.1 g/dL (ref 6.0–8.3)

## 2019-04-21 LAB — HEMOGLOBIN A1C: Hgb A1c MFr Bld: 6.2 % (ref 4.6–6.5)

## 2019-04-21 MED ORDER — MIRABEGRON ER 50 MG PO TB24: 50.0000 mg | ORAL_TABLET | Freq: Every day | ORAL | 3 refills | Status: DC

## 2019-04-21 NOTE — Assessment & Plan Note (Signed)
Chronic Controlled, stable Continue current dose of medication-Lexapro 20 mg daily, Xanax as needed

## 2019-04-21 NOTE — Assessment & Plan Note (Signed)
BP Readings from Last 3 Encounters:  04/21/19 (!) 142/80  10/28/18 110/70  10/17/18 132/64   Chronic BP well controlled Current regimen effective and well tolerated Continue current medications at current doses cmp

## 2019-04-21 NOTE — Assessment & Plan Note (Signed)
Chronic Check lipid panel  Continue daily Zetia-statin intolerant Regular exercise and healthy diet encouraged

## 2019-04-21 NOTE — Assessment & Plan Note (Signed)
Chronic Check a1c Low sugar / carb diet

## 2019-04-21 NOTE — Assessment & Plan Note (Signed)
Chronic Taking Myrbetriq 25 mg daily She does find the medication to be helpful Discussed that we can try increasing this to 50 to see if it is more effective and she would like to try that Increase Myrbetriq 50 mg daily-reviewed most likely side effects

## 2019-04-22 ENCOUNTER — Encounter: Payer: Self-pay | Admitting: Internal Medicine

## 2019-05-01 ENCOUNTER — Encounter: Payer: Self-pay | Admitting: Internal Medicine

## 2019-05-01 DIAGNOSIS — E782 Mixed hyperlipidemia: Secondary | ICD-10-CM

## 2019-06-06 ENCOUNTER — Other Ambulatory Visit: Payer: Self-pay | Admitting: Internal Medicine

## 2019-06-13 ENCOUNTER — Ambulatory Visit: Payer: Medicare PPO | Admitting: Internal Medicine

## 2019-06-13 ENCOUNTER — Other Ambulatory Visit: Payer: Self-pay

## 2019-06-13 ENCOUNTER — Encounter: Payer: Self-pay | Admitting: Internal Medicine

## 2019-06-13 VITALS — BP 130/78 | HR 86 | Temp 97.2°F | Ht 65.0 in | Wt 229.4 lb

## 2019-06-13 DIAGNOSIS — R058 Other specified cough: Secondary | ICD-10-CM

## 2019-06-13 DIAGNOSIS — R05 Cough: Secondary | ICD-10-CM

## 2019-06-13 DIAGNOSIS — E782 Mixed hyperlipidemia: Secondary | ICD-10-CM | POA: Diagnosis not present

## 2019-06-13 NOTE — Progress Notes (Signed)
LIPID CLINIC CONSULT NOTE  Chief Complaint:  Dyslipidemia  Primary Care Physician: Binnie Rail, MD  Primary Cardiologist:  No primary care provider on file.  HPI:  Kayla Maddox is a 74 y.o. female who is being seen today for the evaluation of dyslipidemia at the request of Burns, Claudina Lick, MD.  This is a pleasant 74 year old female kindly referred for evaluation management of dyslipidemia.  She has no known history of coronary disease and few cardiovascular risk factors although her father did have bypass surgery and died of heart disease.  Her most recent cholesterol showed total of 226, triglycerides 243, HDL 50 and a direct LDL of 156.  Previously she had been on simvastatin which caused some muscle weakness and since has been on ezetimibe.  Her PCP noted that she has not had a significant reduction in her cholesterol on ezetimibe and therefore was referred for further evaluation.  Based on age and family history I would put her likely low to intermediate risk of heart disease and likely target LDL less than 100.  We also discussed the possibility of calcium scoring to further restratify her.  She also discussed today and issues she has been having with chronic cough.  She was told at one point that she should consider seeing a cardiologist because chronic cough could be related to heart failure.  I do not appreciate any signs or symptoms of heart failure and did examine her today.  PMHx:  Past Medical History:  Diagnosis Date  . Allergic rhinitis   . Allergy   . Anxiety   . Arthritis   . Cataract    bilat removed   . Chronic colitis   . Crohn's colitis ([place])   . Diverticulosis   . Fundic gland polyps of stomach, benign   . Gallbladder polyp 2017  . Gastric heterotopia   . GERD (gastroesophageal reflux disease)   . Hyperlipidemia   . Hyperplastic colon polyp   . Hypertension   . Internal hemorrhoids   . Obesity   . Serrated adenoma of colon   . Sleep apnea   . Tubular  adenoma of colon     Past Surgical History:  Procedure Laterality Date  . BLADDER SUSPENSION  2011  . cataract surgery Bilateral 2017  . COLONOSCOPY    . FOOT SURGERY Left 2005  . NASAL SINUS SURGERY  2012  . POLYPECTOMY    . [place]  . UPPER GASTROINTESTINAL ENDOSCOPY      FAMHx:  Family History  Problem Relation Age of Onset  . Diabetes Father   . Hypertension Father   . Heart disease Father   . Hypertension Mother   . Heart disease Mother   . Colon cancer Neg Hx   . Esophageal cancer Neg Hx   . Rectal cancer Neg Hx   . Stomach cancer Neg Hx   . Colon polyps Neg Hx     SOCHx:   reports that she quit smoking about 38 years ago. Her smoking use included cigarettes. She has a 20.00 pack-year smoking history. She has never used smokeless tobacco. She reports that she does not drink alcohol or use drugs.  ALLERGIES:  Allergies  Allergen Reactions  . Simvastatin     Muscle weakness  . Amoxicillin Rash  . Clarithromycin Rash  . Doxycycline Rash  . Prednisone Rash  . Sulfonamide Derivatives Rash    ROS: Pertinent items noted in HPI and remainder of comprehensive ROS otherwise  negative.  HOME MEDS: Current Outpatient Medications on File Prior to Visit  Medication Sig Dispense Refill  . ALPRAZolam (XANAX) 0.25 MG tablet Take 1 tablet (0.25 mg total) by mouth 2 (two) times daily as needed for anxiety. 60 tablet 0  . Ascorbic Acid (VITAMIN C) 1000 MG tablet Take 1,000 mg by mouth daily.    . benzonatate (TESSALON) 200 MG capsule Take 1 capsule (200 mg total) by mouth 3 (three) times daily as needed for cough. 90 capsule 1  . Coenzyme Q10 (CO Q-10) 200 MG CAPS Take 200 mg by mouth.    . escitalopram (LEXAPRO) 20 MG tablet Take 1 tablet (20 mg total) by mouth daily. 90 tablet 1  . ezetimibe (ZETIA) 10 MG tablet TAKE ONE TABLET EVERY DAY 90 tablet 1  . fluticasone (FLONASE) 50 MCG/ACT nasal spray INHALE 2 SPRAYS IN EACH NOSTRIL EVERY DAY 16 g 3  .  hydrochlorothiazide (HYDRODIURIL) 25 MG tablet Take 1 tablet (25 mg total) by mouth daily. 90 tablet 3  . mesalamine (APRISO) 0.375 g 24 hr capsule Take 4 capsules (1.5 g total) by mouth daily. Take 4 every morning. 360 capsule 4  . MYRBETRIQ 25 MG TB24 tablet TAKE ONE TABLET EVERY DAY 90 tablet 0  . vitamin B-12 (CYANOCOBALAMIN) 1000 MCG tablet Take 1 tablet (1,000 mcg total) by mouth daily.     No current facility-administered medications on file prior to visit.    LABS/IMAGING: No results found for this or any previous visit (from the past 48 hour(s)). No results found.  LIPID PANEL:    Component Value Date/Time   CHOL 226 (H) 04/21/2019 1214   TRIG 243.0 (H) 04/21/2019 1214   HDL 50.70 04/21/2019 1214   CHOLHDL 4 04/21/2019 1214   VLDL 48.6 (H) 04/21/2019 1214   LDLCALC 126 (H) 10/17/2018 1136   LDLDIRECT 156.0 04/21/2019 1214    WEIGHTS: Wt Readings from Last 3 Encounters:  06/13/19 229 lb 6.4 oz (104.1 kg)  04/21/19 226 lb (102.5 kg)  10/28/18 218 lb (98.9 kg)    VITALS: BP 130/78   Pulse 86   Temp (!) 97.2 F (36.2 C)   Ht 5' 5"  (1.651 m)   Wt 229 lb 6.4 oz (104.1 kg)   SpO2 96%   BMI 38.17 kg/m   EXAM: General appearance: alert and no distress Lungs: clear to auscultation bilaterally Heart: regular rate and rhythm, S1, S2 normal, no murmur, click, rub or gallop Extremities: extremities normal, atraumatic, no cyanosis or edema Neurologic: Grossly normal  EKG: Deferred  ASSESSMENT: 1. Mixed dyslipidemia 2. Low to intermediate cardiovascular risk 3. Family history of coronary disease in her father 39. Chronic cough 5. Myalgia on simvastatin  PLAN: 1.   This is a 74 year old female with a mixed dyslipidemia and likely target LDL less than 100.  I had offered coronary calcium score and is try to further restratify her which she is agreeable to.  If there is significant calcification then would likely recommend a target LDL less than 70.  She is really  only failed simvastatin and may be tried on a high potency statin such as rosuvastatin 20 mg daily.  If this is also not well-tolerated then we could consider other options.  She seems to be free of side effects on ezetimibe, therefore we will continue it.  Thanks again for the kind referral.  Pixie Casino, MD, FACC, [place] Director of the [facility]  [facility] Diplomate of the American Board of Clinical Lipidology Attending Cardiologist  Direct Dial: [phone]  Fax: [phone]  Website:  [email or web address]  Nadean Corwin  06/13/2019, 1:20 PM

## 2019-06-13 NOTE — Patient Instructions (Signed)
Medication Instructions:  Your physician recommends that you continue on your current medications as directed. Please refer to the Current Medication list given to you today.  *If you need a refill on your cardiac medications before your next appointment, please call your pharmacy*   Testing/Procedures: Dr. Debara Pickett has ordered a CT coronary calcium score. This test is done at [address]. This is $150 out of pocket.   Coronary CalciumScan A coronary calcium scan is an imaging test used to look for deposits of calcium and other fatty materials (plaques) in the inner lining of the blood vessels of the heart (coronary arteries). These deposits of calcium and plaques can partly clog and narrow the coronary arteries without producing any symptoms or warning signs. This puts a person at risk for a heart attack. This test can detect these deposits before symptoms develop. Tell a health care provider about:  Any allergies you have.  All medicines you are taking, including vitamins, herbs, eye drops, creams, and over-the-counter medicines.  Any problems you or family members have had with anesthetic medicines.  Any blood disorders you have.  Any surgeries you have had.  Any medical conditions you have.  Whether you are pregnant or may be pregnant. What are the risks? Generally, this is a safe procedure. However, problems may occur, including:  Harm to a pregnant woman and her unborn baby. This test involves the use of radiation. Radiation exposure can be dangerous to a pregnant woman and her unborn baby. If you are pregnant, you generally should not have this procedure done.  Slight increase in the risk of cancer. This is because of the radiation involved in the test. What happens before the procedure? No preparation is needed for this procedure. What happens during the procedure?  You will undress and remove any jewelry around your neck or chest.  You will put on a  hospital gown.  Sticky electrodes will be placed on your chest. The electrodes will be connected to an electrocardiogram (ECG) machine to record a tracing of the electrical activity of your heart.  A CT scanner will take pictures of your heart. During this time, you will be asked to lie still and hold your breath for 2-3 seconds while a picture of your heart is being taken. The procedure may vary among health care providers and hospitals. What happens after the procedure?  You can get dressed.  You can return to your normal activities.  It is up to you to get the results of your test. Ask your health care provider, or the department that is doing the test, when your results will be ready. Summary  A coronary calcium scan is an imaging test used to look for deposits of calcium and other fatty materials (plaques) in the inner lining of the blood vessels of the heart (coronary arteries).  Generally, this is a safe procedure. Tell your health care provider if you are pregnant or may be pregnant.  No preparation is needed for this procedure.  A CT scanner will take pictures of your heart.  You can return to your normal activities after the scan is done. This information is not intended to replace advice given to you by your health care provider. Make sure you discuss any questions you have with your health care provider. Document Released: 08/26/2007 Document Revised: 01/17/2016 Document Reviewed: 01/17/2016 Elsevier Interactive Patient Education  2017 [employer].    Follow-Up: At [facility], you and your health needs are  our priority.  As part of our continuing mission to provide you with exceptional heart care, we have created designated Provider Care Teams.  These Care Teams include your primary Cardiologist (physician) and Advanced Practice Providers (APPs -  Physician Assistants and Nurse Practitioners) who all work together to provide you with the care you need, when you need  it.  We recommend signing up for the patient portal called "MyChart".  Sign up information is provided on this After Visit Summary.  MyChart is used to connect with patients for Virtual Visits (Telemedicine).  Patients are able to view lab/test results, encounter notes, upcoming appointments, etc.  Non-urgent messages can be sent to your provider as well.   To learn more about what you can do with MyChart, go to [email or web address].    Your next appointment:   3-4 month(s) - lipid clinic  The format for your next appointment:   Either In Person or Virtual  Provider:   K. Mali Hilty, MD   Other Instructions

## 2019-06-17 DIAGNOSIS — Z01812 Encounter for preprocedural laboratory examination: Secondary | ICD-10-CM | POA: Diagnosis not present

## 2019-06-17 DIAGNOSIS — Z20822 Contact with and (suspected) exposure to covid-19: Secondary | ICD-10-CM | POA: Diagnosis not present

## 2019-06-18 ENCOUNTER — Inpatient Hospital Stay: Admission: RE | Admit: 2019-06-18 | Payer: Medicare PPO | Source: Ambulatory Visit

## 2019-06-19 DIAGNOSIS — I10 Essential (primary) hypertension: Secondary | ICD-10-CM | POA: Diagnosis not present

## 2019-06-19 DIAGNOSIS — I2510 Atherosclerotic heart disease of native coronary artery without angina pectoris: Secondary | ICD-10-CM | POA: Diagnosis not present

## 2019-06-19 DIAGNOSIS — Z880 Allergy status to penicillin: Secondary | ICD-10-CM | POA: Diagnosis not present

## 2019-06-19 DIAGNOSIS — H02831 Dermatochalasis of right upper eyelid: Secondary | ICD-10-CM | POA: Diagnosis not present

## 2019-06-19 DIAGNOSIS — Z87891 Personal history of nicotine dependence: Secondary | ICD-10-CM | POA: Diagnosis not present

## 2019-06-19 DIAGNOSIS — K219 Gastro-esophageal reflux disease without esophagitis: Secondary | ICD-10-CM | POA: Diagnosis not present

## 2019-06-19 DIAGNOSIS — H02403 Unspecified ptosis of bilateral eyelids: Secondary | ICD-10-CM | POA: Diagnosis not present

## 2019-06-19 DIAGNOSIS — H02834 Dermatochalasis of left upper eyelid: Secondary | ICD-10-CM | POA: Diagnosis not present

## 2019-06-19 DIAGNOSIS — G4733 Obstructive sleep apnea (adult) (pediatric): Secondary | ICD-10-CM | POA: Diagnosis not present

## 2019-07-09 ENCOUNTER — Ambulatory Visit (INDEPENDENT_AMBULATORY_CARE_PROVIDER_SITE_OTHER)
Admission: RE | Admit: 2019-07-09 | Discharge: 2019-07-09 | Disposition: A | Discharge status: Home / Self Care | Payer: Self-pay | Source: Ambulatory Visit | Attending: Internal Medicine | Admitting: Internal Medicine

## 2019-07-09 ENCOUNTER — Other Ambulatory Visit: Payer: Self-pay | Admitting: Internal Medicine

## 2019-07-09 ENCOUNTER — Other Ambulatory Visit: Payer: Self-pay

## 2019-07-09 DIAGNOSIS — E782 Mixed hyperlipidemia: Secondary | ICD-10-CM

## 2019-07-09 DIAGNOSIS — R918 Other nonspecific abnormal finding of lung field: Secondary | ICD-10-CM | POA: Diagnosis not present

## 2019-07-10 DIAGNOSIS — R300 Dysuria: Secondary | ICD-10-CM | POA: Diagnosis not present

## 2019-07-11 ENCOUNTER — Other Ambulatory Visit: Payer: Self-pay | Admitting: *Deleted

## 2019-07-11 DIAGNOSIS — E782 Mixed hyperlipidemia: Secondary | ICD-10-CM

## 2019-07-11 DIAGNOSIS — R918 Other nonspecific abnormal finding of lung field: Secondary | ICD-10-CM

## 2019-07-11 MED ORDER — ROSUVASTATIN CALCIUM 20 MG PO TABS
20.0000 mg | ORAL_TABLET | Freq: Every day | ORAL | 3 refills | Status: DC
Start: 2019-07-11 — End: 2020-09-03

## 2019-10-14 DIAGNOSIS — H02403 Unspecified ptosis of bilateral eyelids: Secondary | ICD-10-CM | POA: Diagnosis not present

## 2019-10-14 DIAGNOSIS — H02831 Dermatochalasis of right upper eyelid: Secondary | ICD-10-CM | POA: Diagnosis not present

## 2019-10-14 DIAGNOSIS — H02834 Dermatochalasis of left upper eyelid: Secondary | ICD-10-CM | POA: Diagnosis not present

## 2019-10-20 ENCOUNTER — Ambulatory Visit: Payer: Medicare PPO | Admitting: Internal Medicine

## 2019-10-26 NOTE — Patient Instructions (Addendum)
  Blood work was ordered.     Medications reviewed and updated.  Changes include :   Increase myrbetriq to 50 mg  Your prescription(s) have been submitted to your pharmacy. Please take as directed and contact our office if you believe you are having problem(s) with the medication(s).    Please followup in 6 months

## 2019-10-26 NOTE — Progress Notes (Signed)
Subjective:    Patient ID: Kayla Maddox, female    DOB: 07-26-1945, 74 y.o.   MRN: [id number]  HPI The patient is here for follow up of their chronic medical problems, including htn, hyperlipidemia, anxiety, depression, prediabetes.  She is taking all of her medications as prescribed.   She is not exercising.   She has been more fatigued  - she did start using her cpap nightly three weeks ago and that has helped some, but her energy level is still low.    Medications and allergies reviewed with patient and updated if appropriate.  Patient Active Problem List   Diagnosis Date Noted  . Fatigue 10/27/2019  . Foot pain, right 10/17/2018  . Neuropathy 10/17/2018  . Obesity (BMI 30-39.9) 07/10/2017  . Esophageal spasm 01/08/2017  . Overactive bladder 01/07/2017  . B12 deficiency 11/26/2016  . Prediabetes 05/06/2015  . Fatty liver 05/04/2015  . Dizziness 06/12/2014  . Chest tightness 04/29/2014  . Flushing 04/29/2014  . Back pain with sciatica 11/06/2013  . Anxiety and depression 10/28/2013  . Crohn's colitis ([place]) 10/21/2013  . Adenomatous colon polyp 10/21/2013  . Gastroesophageal reflux disease without esophagitis 10/21/2013  . Hyperlipidemia 02/10/2010  . Essential hypertension 02/10/2010  . Asthma 02/10/2010  . OSA (obstructive sleep apnea) 02/10/2010  . DYSPNEA 02/10/2010  . Upper airway cough syndrome 02/09/2010    Current Outpatient Medications on File Prior to Visit  Medication Sig Dispense Refill  . ALPRAZolam (XANAX) 0.25 MG tablet Take 1 tablet (0.25 mg total) by mouth 2 (two) times daily as needed for anxiety. 60 tablet 0  . Ascorbic Acid (VITAMIN C) 1000 MG tablet Take 1,000 mg by mouth daily.    . benzonatate (TESSALON) 200 MG capsule Take 1 capsule (200 mg total) by mouth 3 (three) times daily as needed for cough. 90 capsule 1  . Calcium Carbonate (CALCIUM 500 PO) Take by mouth.    . Coenzyme Q10 (CO Q-10) 200 MG CAPS Take 200 mg by mouth.    .  escitalopram (LEXAPRO) 20 MG tablet Take 1 tablet (20 mg total) by mouth daily. 90 tablet 1  . ezetimibe (ZETIA) 10 MG tablet TAKE ONE TABLET EVERY DAY 90 tablet 1  . fluticasone (FLONASE) 50 MCG/ACT nasal spray INHALE 2 SPRAYS IN EACH NOSTRIL EVERY DAY 16 g 3  . hydrochlorothiazide (HYDRODIURIL) 25 MG tablet Take 1 tablet (25 mg total) by mouth daily. 90 tablet 3  . mesalamine (APRISO) 0.375 g 24 hr capsule Take 4 capsules (1.5 g total) by mouth daily. Take 4 every morning. 360 capsule 4  . vitamin B-12 (CYANOCOBALAMIN) 1000 MCG tablet Take 1 tablet (1,000 mcg total) by mouth daily.    . [DISCONTINUED] MYRBETRIQ 25 MG TB24 tablet TAKE ONE TABLET EVERY DAY 90 tablet 0  . rosuvastatin (CRESTOR) 20 MG tablet Take 1 tablet (20 mg total) by mouth daily. 90 tablet 3   No current facility-administered medications on file prior to visit.    Past Medical History:  Diagnosis Date  . Allergic rhinitis   . Allergy   . Anxiety   . Arthritis   . Cataract    bilat removed   . Chronic colitis   . Crohn's colitis ([place])   . Diverticulosis   . Fundic gland polyps of stomach, benign   . Gallbladder polyp 2017  . Gastric heterotopia   . GERD (gastroesophageal reflux disease)   . Hyperlipidemia   . Hyperplastic colon polyp   . Hypertension   .  Internal hemorrhoids   . Obesity   . Serrated adenoma of colon   . Sleep apnea   . Tubular adenoma of colon     Past Surgical History:  Procedure Laterality Date  . BLADDER SUSPENSION  2011  . cataract surgery Bilateral 2017  . COLONOSCOPY    . FOOT SURGERY Left 2005  . NASAL SINUS SURGERY  2012  . POLYPECTOMY    . [place]  . UPPER GASTROINTESTINAL ENDOSCOPY      Social History   Socioeconomic History  . Marital status: Married    Spouse name: Not on file  . Number of children: 1  . Years of education: Not on file  . Highest education level: Not on file  Occupational History  . Occupation: office clerk/ retired    Fish farm manager:  [employer]  Tobacco Use  . Smoking status: Former Smoker    Packs/day: 1.00    Years: 20.00    Pack years: 20.00    Types: Cigarettes    Quit date: 03/13/1981    Years since quitting: 38.6  . Smokeless tobacco: Never Used  Vaping Use  . Vaping Use: Never used  Substance and Sexual Activity  . Alcohol use: No  . Drug use: No  . Sexual activity: Not Currently  Other Topics Concern  . Not on file  Social History Narrative   No exercise      Retiring in June 2017   Social Determinants of Health   Financial Resource Strain:   . Difficulty of Paying Living Expenses:   Food Insecurity:   . Worried About Charity fundraiser in the Last Year:   . Arboriculturist in the Last Year:   Transportation Needs:   . Film/video editor (Medical):   Marland Kitchen Lack of Transportation (Non-Medical):   Physical Activity:   . Days of Exercise per Week:   . Minutes of Exercise per Session:   Stress: No Stress Concern Present  . Feeling of Stress : Only a little  Social Connections:   . Frequency of Communication with Friends and Family:   . Frequency of Social Gatherings with Friends and Family:   . Attends Religious Services:   . Active Member of Clubs or Organizations:   . Attends Archivist Meetings:   Marland Kitchen Marital Status:     Family History  Problem Relation Age of Onset  . Diabetes Father   . Hypertension Father   . Heart disease Father   . Hypertension Mother   . Heart disease Mother   . Colon cancer Neg Hx   . Esophageal cancer Neg Hx   . Rectal cancer Neg Hx   . Stomach cancer Neg Hx   . Colon polyps Neg Hx     Review of Systems  Constitutional: Positive for fatigue. Negative for chills and fever.  Respiratory: Positive for cough (chronic). Negative for shortness of breath and wheezing.   Cardiovascular: Negative for chest pain, palpitations and leg swelling.  Neurological: Negative for light-headedness and headaches.       Objective:   Vitals:   10/27/19 1326   BP: 130/74  Pulse: 72  Temp: 98.4 F (36.9 C)  SpO2: 96%   BP Readings from Last 3 Encounters:  10/27/19 130/74  06/13/19 130/78  04/21/19 (!) 142/80   Wt Readings from Last 3 Encounters:  10/27/19 229 lb (103.9 kg)  06/13/19 229 lb 6.4 oz (104.1 kg)  04/21/19 226 lb (102.5 kg)  Body mass index is 38.11 kg/m.   Physical Exam    Constitutional: Appears well-developed and well-nourished. No distress.  HENT:  Head: Normocephalic and atraumatic.  Neck: Neck supple. No tracheal deviation present. No thyromegaly present.  No cervical lymphadenopathy Cardiovascular: Normal rate, regular rhythm and normal heart sounds.   No murmur heard. No carotid bruit .  No edema Pulmonary/Chest: Effort normal and breath sounds normal. No respiratory distress. No has no wheezes. No rales.  Skin: Skin is warm and dry. Not diaphoretic.  Psychiatric: Normal mood and affect. Behavior is normal.      Assessment & Plan:    See Problem List for Assessment and Plan of chronic medical problems.    This visit occurred during the SARS-CoV-2 public health emergency.  Safety protocols were in place, including screening questions prior to the visit, additional usage of staff PPE, and extensive cleaning of exam room while observing appropriate contact time as indicated for disinfecting solutions.

## 2019-10-27 ENCOUNTER — Other Ambulatory Visit: Payer: Self-pay

## 2019-10-27 ENCOUNTER — Ambulatory Visit: Payer: Medicare PPO | Admitting: Internal Medicine

## 2019-10-27 ENCOUNTER — Encounter: Payer: Self-pay | Admitting: Internal Medicine

## 2019-10-27 VITALS — BP 130/74 | HR 72 | Temp 98.4°F | Ht 65.0 in | Wt 229.0 lb

## 2019-10-27 DIAGNOSIS — F329 Major depressive disorder, single episode, unspecified: Secondary | ICD-10-CM

## 2019-10-27 DIAGNOSIS — N3281 Overactive bladder: Secondary | ICD-10-CM

## 2019-10-27 DIAGNOSIS — E538 Deficiency of other specified B group vitamins: Secondary | ICD-10-CM

## 2019-10-27 DIAGNOSIS — G4733 Obstructive sleep apnea (adult) (pediatric): Secondary | ICD-10-CM

## 2019-10-27 DIAGNOSIS — R7303 Prediabetes: Secondary | ICD-10-CM | POA: Diagnosis not present

## 2019-10-27 DIAGNOSIS — E782 Mixed hyperlipidemia: Secondary | ICD-10-CM | POA: Diagnosis not present

## 2019-10-27 DIAGNOSIS — R5383 Other fatigue: Secondary | ICD-10-CM | POA: Diagnosis not present

## 2019-10-27 DIAGNOSIS — I10 Essential (primary) hypertension: Secondary | ICD-10-CM

## 2019-10-27 DIAGNOSIS — F419 Anxiety disorder, unspecified: Secondary | ICD-10-CM

## 2019-10-27 MED ORDER — MIRABEGRON ER 50 MG PO TB24: 50.0000 mg | ORAL_TABLET | Freq: Every day | ORAL | 1 refills | Status: DC

## 2019-10-27 MED ORDER — HYDROCHLOROTHIAZIDE 25 MG PO TABS: 25.0000 mg | ORAL_TABLET | Freq: Every day | ORAL | 3 refills | Status: DC

## 2019-10-27 MED ORDER — ESCITALOPRAM OXALATE 20 MG PO TABS: 20.0000 mg | ORAL_TABLET | Freq: Every day | ORAL | 1 refills | Status: DC

## 2019-10-27 NOTE — Assessment & Plan Note (Signed)
Chronic BP well controlled Current regimen effective and well tolerated Continue current medications at current doses cmp  

## 2019-10-27 NOTE — Assessment & Plan Note (Signed)
Chronic Taking B12 maybe 2/week Check B12 level

## 2019-10-27 NOTE — Assessment & Plan Note (Signed)
Chronic States some increased irritability, but feels medication dose is still adequate Continue lexapro 20 mg daily

## 2019-10-27 NOTE — Assessment & Plan Note (Addendum)
Chronic Using cpap regularly for the past three weeks - energy level improved a little Continue regular use

## 2019-10-27 NOTE — Assessment & Plan Note (Signed)
Chronic Taking myrbetriq 25 mg  Still has freq and leakage Inc to 50 mg daily

## 2019-10-27 NOTE — Assessment & Plan Note (Signed)
Chronic Check lipid panel  Continue daily statin Regular exercise and healthy diet encouraged  

## 2019-10-27 NOTE — Assessment & Plan Note (Signed)
Chronic Check a1c Low sugar / carb diet Stressed regular exercise  

## 2019-10-27 NOTE — Assessment & Plan Note (Signed)
Acute Has fatigue - using cpap has helped, but energy level still low Check tsh, cbc, cmp, b12 level Encouraged regular exercise, weight loss Denies depression

## 2019-10-28 LAB — COMPLETE METABOLIC PANEL WITH GFR
AG Ratio: 1.6 (calc) (ref 1.0–2.5)
ALT: 17 U/L (ref 6–29)
AST: 18 U/L (ref 10–35)
Albumin: 4.4 g/dL (ref 3.6–5.1)
Alkaline phosphatase (APISO): 62 U/L (ref 37–153)
BUN: 11 mg/dL (ref 7–25)
CO2: 33 mmol/L — ABNORMAL HIGH (ref 20–32)
Calcium: 10.2 mg/dL (ref 8.6–10.4)
Chloride: 99 mmol/L (ref 98–110)
Creat: 0.86 mg/dL (ref 0.60–0.93)
GFR, Est African American: 78 mL/min/{1.73_m2} (ref >=60)
GFR, Est Non African American: 67 mL/min/{1.73_m2} (ref >=60)
Globulin: 2.8 g/dL (calc) (ref 1.9–3.7)
Glucose, Bld: 86 mg/dL (ref 65–99)
Potassium: 4.4 mmol/L (ref 3.5–5.3)
Sodium: 141 mmol/L (ref 135–146)
Total Bilirubin: 0.6 mg/dL (ref 0.2–1.2)
Total Protein: 7.2 g/dL (ref 6.1–8.1)

## 2019-10-28 LAB — LIPID PANEL
Cholesterol: 141 mg/dL (ref <200)
HDL: 54 mg/dL (ref >=50)
LDL Cholesterol (Calc): 65 mg/dL (calc)
Non-HDL Cholesterol (Calc): 87 mg/dL (calc) (ref <130)
Total CHOL/HDL Ratio: 2.6 (calc) (ref <5.0)
Triglycerides: 138 mg/dL (ref <150)

## 2019-10-28 LAB — HEMOGLOBIN A1C
Hgb A1c MFr Bld: 6.1 % of total Hgb — ABNORMAL HIGH (ref <5.7)
Mean Plasma Glucose: 128 (calc)
eAG (mmol/L): 7.1 (calc)

## 2019-10-28 LAB — CBC WITH DIFFERENTIAL/PLATELET
Absolute Monocytes: 509 cells/uL (ref 200–950)
Basophils Absolute: 30 cells/uL (ref 0–200)
Basophils Relative: 0.4 %
Eosinophils Absolute: 167 cells/uL (ref 15–500)
Eosinophils Relative: 2.2 %
HCT: 44.1 % (ref 35.0–45.0)
Hemoglobin: 14.9 g/dL (ref 11.7–15.5)
Lymphs Abs: 1885 cells/uL (ref 850–3900)
MCH: 29.8 pg (ref 27.0–33.0)
MCHC: 33.8 g/dL (ref 32.0–36.0)
MCV: 88.2 fL (ref 80.0–100.0)
MPV: 10.7 fL (ref 7.5–12.5)
Monocytes Relative: 6.7 %
Neutro Abs: 5008 cells/uL (ref 1500–7800)
Neutrophils Relative %: 65.9 %
Platelets: 218 10*3/uL (ref 140–400)
RBC: 5 10*6/uL (ref 3.80–5.10)
RDW: 12.3 % (ref 11.0–15.0)
Total Lymphocyte: 24.8 %
WBC: 7.6 10*3/uL (ref 3.8–10.8)

## 2019-10-28 LAB — VITAMIN B12: Vitamin B-12: 471 pg/mL (ref 200–1100)

## 2019-10-28 LAB — TSH: TSH: 1.6 mIU/L (ref 0.40–4.50)

## 2019-11-19 ENCOUNTER — Other Ambulatory Visit: Payer: Self-pay

## 2019-11-19 DIAGNOSIS — E782 Mixed hyperlipidemia: Secondary | ICD-10-CM | POA: Diagnosis not present

## 2019-11-19 LAB — LIPID PANEL
Chol/HDL Ratio: 2.8 ratio (ref 0.0–4.4)
Cholesterol, Total: 150 mg/dL (ref 100–199)
HDL: 54 mg/dL (ref >39)
LDL Chol Calc (NIH): 68 mg/dL (ref 0–99)
Triglycerides: 167 mg/dL — ABNORMAL HIGH (ref 0–149)
VLDL Cholesterol Cal: 28 mg/dL (ref 5–40)

## 2019-11-27 ENCOUNTER — Encounter: Payer: Self-pay | Admitting: Internal Medicine

## 2019-11-27 ENCOUNTER — Other Ambulatory Visit: Payer: Self-pay

## 2019-11-27 ENCOUNTER — Ambulatory Visit (INDEPENDENT_AMBULATORY_CARE_PROVIDER_SITE_OTHER): Payer: Medicare PPO | Admitting: Internal Medicine

## 2019-11-27 VITALS — BP 136/72 | HR 86 | Ht 65.0 in | Wt 228.2 lb

## 2019-11-27 DIAGNOSIS — E782 Mixed hyperlipidemia: Secondary | ICD-10-CM

## 2019-11-27 DIAGNOSIS — R931 Abnormal findings on diagnostic imaging of heart and coronary circulation: Secondary | ICD-10-CM | POA: Diagnosis not present

## 2019-11-27 NOTE — Progress Notes (Signed)
LIPID CLINIC CONSULT NOTE  Chief Complaint:  Dyslipidemia  Primary Care Physician: Binnie Rail, MD  Primary Cardiologist:  No primary care provider on file.  HPI:  Kayla Maddox is a 74 y.o. female who is being seen today for the evaluation of dyslipidemia at the request of Burns, Claudina Lick, MD.  This is a pleasant 74 year old female kindly referred for evaluation management of dyslipidemia.  She has no known history of coronary disease and few cardiovascular risk factors although her father did have bypass surgery and died of heart disease.  Her most recent cholesterol showed total of 226, triglycerides 243, HDL 50 and a direct LDL of 156.  Previously she had been on simvastatin which caused some muscle weakness and since has been on ezetimibe.  Her PCP noted that she has not had a significant reduction in her cholesterol on ezetimibe and therefore was referred for further evaluation.  Based on age and family history I would put her likely low to intermediate risk of heart disease and likely target LDL less than 100.  We also discussed the possibility of calcium scoring to further restratify her.  She also discussed today and issues she has been having with chronic cough.  She was told at one point that she should consider seeing a cardiologist because chronic cough could be related to heart failure.  I do not appreciate any signs or symptoms of heart failure and did examine her today.  11/27/2019  Kayla Maddox is seen today in follow-up.  Overall she is doing very well.  She is tolerating rosuvastatin without any significant side effects.  Her lipids are significantly improved with a total cholesterol of 150, triglycerides 167, HDL 54 and an LDL of 68.  She was noted to have some coronary artery calcification namely a calcium score of 31.5 which was 52nd percentile for age and sex matched control.  Calcium was noted predominantly in the right coronary artery.  Overall it is a fairly low risk  finding but abnormal and given her family history think that she has easily reached her LDL targets at this point.  PMHx:  Past Medical History:  Diagnosis Date  . Allergic rhinitis   . Allergy   . Anxiety   . Arthritis   . Cataract    bilat removed   . Chronic colitis   . Crohn's colitis ([place])   . Diverticulosis   . Fundic gland polyps of stomach, benign   . Gallbladder polyp 2017  . Gastric heterotopia   . GERD (gastroesophageal reflux disease)   . Hyperlipidemia   . Hyperplastic colon polyp   . Hypertension   . Internal hemorrhoids   . Obesity   . Serrated adenoma of colon   . Sleep apnea   . Tubular adenoma of colon     Past Surgical History:  Procedure Laterality Date  . BLADDER SUSPENSION  2011  . cataract surgery Bilateral 2017  . COLONOSCOPY    . FOOT SURGERY Left 2005  . NASAL SINUS SURGERY  2012  . POLYPECTOMY    . [place]  . UPPER GASTROINTESTINAL ENDOSCOPY      FAMHx:  Family History  Problem Relation Age of Onset  . Diabetes Father   . Hypertension Father   . Heart disease Father   . Hypertension Mother   . Heart disease Mother   . Colon cancer Neg Hx   . Esophageal cancer Neg Hx   . Rectal cancer Neg  Hx   . Stomach cancer Neg Hx   . Colon polyps Neg Hx     SOCHx:   reports that she quit smoking about 38 years ago. Her smoking use included cigarettes. She has a 20.00 pack-year smoking history. She has never used smokeless tobacco. She reports that she does not drink alcohol and does not use drugs.  ALLERGIES:  Allergies  Allergen Reactions  . Simvastatin     Muscle weakness  . Amoxicillin Rash  . Clarithromycin Rash  . Doxycycline Rash  . Prednisone Rash  . Sulfonamide Derivatives Rash    ROS: Pertinent items noted in HPI and remainder of comprehensive ROS otherwise negative.  HOME MEDS: Current Outpatient Medications on File Prior to Visit  Medication Sig Dispense Refill  . ALPRAZolam (XANAX) 0.25 MG tablet Take 1  tablet (0.25 mg total) by mouth 2 (two) times daily as needed for anxiety. 60 tablet 0  . Ascorbic Acid (VITAMIN C) 1000 MG tablet Take 1,000 mg by mouth daily.    . benzonatate (TESSALON) 200 MG capsule Take 1 capsule (200 mg total) by mouth 3 (three) times daily as needed for cough. 90 capsule 1  . Calcium Carbonate (CALCIUM 500 PO) Take by mouth.    . Coenzyme Q10 (CO Q-10) 200 MG CAPS Take 200 mg by mouth.    . escitalopram (LEXAPRO) 20 MG tablet Take 1 tablet (20 mg total) by mouth daily. 90 tablet 1  . fluticasone (FLONASE) 50 MCG/ACT nasal spray INHALE 2 SPRAYS IN EACH NOSTRIL EVERY DAY 16 g 3  . hydrochlorothiazide (HYDRODIURIL) 25 MG tablet Take 1 tablet (25 mg total) by mouth daily. 90 tablet 3  . mesalamine (APRISO) 0.375 g 24 hr capsule Take 4 capsules (1.5 g total) by mouth daily. Take 4 every morning. 360 capsule 4  . mirabegron ER (MYRBETRIQ) 50 MG TB24 tablet Take 1 tablet (50 mg total) by mouth daily. 90 tablet 1  . rosuvastatin (CRESTOR) 20 MG tablet Take 1 tablet (20 mg total) by mouth daily. 90 tablet 3  . vitamin B-12 (CYANOCOBALAMIN) 1000 MCG tablet Take 1 tablet (1,000 mcg total) by mouth daily.     No current facility-administered medications on file prior to visit.    LABS/IMAGING: No results found for this or any previous visit (from the past 48 hour(s)). No results found.  LIPID PANEL:    Component Value Date/Time   CHOL 150 11/19/2019 1106   TRIG 167 (H) 11/19/2019 1106   HDL 54 11/19/2019 1106   CHOLHDL 2.8 11/19/2019 1106   CHOLHDL 2.6 10/27/2019 1418   VLDL 48.6 (H) 04/21/2019 1214   LDLCALC 68 11/19/2019 1106   LDLCALC 65 10/27/2019 1418   LDLDIRECT 156.0 04/21/2019 1214    WEIGHTS: Wt Readings from Last 3 Encounters:  11/27/19 228 lb 3.2 oz (103.5 kg)  10/27/19 229 lb (103.9 kg)  06/13/19 229 lb 6.4 oz (104.1 kg)    VITALS: BP 136/72   Pulse 86   Ht 5' 5"  (1.651 m)   Wt 228 lb 3.2 oz (103.5 kg)   SpO2 96%   BMI 37.97 kg/m    EXAM: Deferred  EKG: Deferred  ASSESSMENT: 1. Mixed dyslipidemia 2. Low to intermediate cardiovascular risk -CAC score 31.5, 52nd percentile for age and sex matched control (06/2019) 3. Family history of coronary disease in her father 62. Chronic cough 5. Myalgia on simvastatin  PLAN: 1.   Kayla Maddox seems to be tolerating rosuvastatin much better than simvastatin and has had  a significant reduction in her lipids.  At this point I would recommend continuing it as she is now at target with LDL less than 70.  With more activity, dietary changes and weight loss we may be able to decrease that dose as well.  I think she can follow-up with her PCP regarding this but I am happy to see her back in the lipid clinic as necessary.  Thanks for allowing me to participate in her care.  Pixie Casino, MD, [facility], [place] Director of the Advanced Lipid Disorders &  Cardiovascular Risk Reduction Clinic Diplomate of the American Board of Clinical Lipidology Attending Cardiologist  Direct Dial: [phone]  Fax: [phone]  Website:  [email or web address] 11/27/2019, 2:38 PM

## 2019-11-27 NOTE — Patient Instructions (Signed)
Medication Instructions:  Your physician recommends that you continue on your current medications as directed. Please refer to the Current Medication list given to you today.  *If you need a refill on your cardiac medications before your next appointment, please call your pharmacy*   Follow-Up: At [facility], you and your health needs are our priority.  As part of our continuing mission to provide you with exceptional heart care, we have created designated Provider Care Teams.  These Care Teams include your primary Cardiologist (physician) and Advanced Practice Providers (APPs -  Physician Assistants and Nurse Practitioners) who all work together to provide you with the care you need, when you need it.  We recommend signing up for the patient portal called "MyChart".  Sign up information is provided on this After Visit Summary.  MyChart is used to connect with patients for Virtual Visits (Telemedicine).  Patients are able to view lab/test results, encounter notes, upcoming appointments, etc.  Non-urgent messages can be sent to your provider as well.   To learn more about what you can do with MyChart, go to [email or web address].    Your next appointment:   AS NEEDED with Dr. Debara Pickett

## 2020-01-09 ENCOUNTER — Telehealth: Payer: Self-pay | Admitting: Internal Medicine

## 2020-01-09 MED ORDER — MESALAMINE ER 0.375 G PO CP24: 1500.0000 mg | ORAL_CAPSULE | Freq: Every day | ORAL | 0 refills | Status: DC

## 2020-01-09 NOTE — Telephone Encounter (Signed)
Rx sent 

## 2020-01-22 ENCOUNTER — Ambulatory Visit (INDEPENDENT_AMBULATORY_CARE_PROVIDER_SITE_OTHER): Payer: Medicare PPO

## 2020-01-22 ENCOUNTER — Other Ambulatory Visit: Payer: Self-pay

## 2020-01-22 DIAGNOSIS — Z0000 Encounter for general adult medical examination without abnormal findings: Secondary | ICD-10-CM

## 2020-01-22 NOTE — Progress Notes (Signed)
I connected with Kayla Maddox today by telephone and verified that I am speaking with the correct person using two identifiers. Location patient: home Location provider: work Persons participating in the virtual visit: Kayla Maddox and Kayla Abu, LPN.   I discussed the limitations, risks, security and privacy concerns of performing an evaluation and management service by telephone and the availability of in person appointments. I also discussed with the patient that there may be a patient responsible charge related to this service. The patient expressed understanding and verbally consented to this telephonic visit.    Interactive audio and video telecommunications were attempted between this provider and patient, however failed, due to patient having technical difficulties OR patient did not have access to video capability.  We continued and completed visit with audio only.  Some vital signs may be absent or patient reported.   Time Spent with patient on telephone encounter: 30 minutes  Subjective:   Kayla Maddox is a 74 y.o. female who presents for Medicare Annual (Subsequent) preventive examination.  Review of Systems    No ROS. Medicare Wellness Visit. Additional risk factors are reflected in social history. Cardiac Risk Factors include: advanced age (>68mn, >>87women);dyslipidemia;hypertension;family history of premature cardiovascular disease Sleep Patterns: No sleep issues, feels rested on waking and sleeps 8 hours nightly. Home Safety/Smoke Alarms: Feels safe in home; uses home alarm. Smoke alarms in place. Living environment: 1-story home; Lives with spouse; no needs for DME; good support system. Seat Belt Safety/Bike Helmet: Wears seat belt.    Objective:    There were no vitals filed for this visit. There is no height or weight on file to calculate BMI.  Advanced Directives 01/22/2020 01/16/2019 11/29/2017 11/27/2016 08/22/2016 08/07/2013  Does Patient Have a Medical  Advance Directive? Yes Yes Yes Yes Yes Patient does not have advance directive  Type of Advance Directive Living will;Healthcare Power of [place] Living will;Healthcare Power of Attorney -  Does patient want to make changes to medical advance directive? No - Patient declined - - - - -  Copy of [place] Chart? No - copy requested No - copy requested No - copy requested No - copy requested - -    Current Medications (verified) Outpatient Encounter Medications as of 01/22/2020  Medication Sig  . ALPRAZolam (XANAX) 0.25 MG tablet Take 1 tablet (0.25 mg total) by mouth 2 (two) times daily as needed for anxiety.  . Ascorbic Acid (VITAMIN C) 1000 MG tablet Take 1,000 mg by mouth daily.  . benzonatate (TESSALON) 200 MG capsule Take 1 capsule (200 mg total) by mouth 3 (three) times daily as needed for cough.  . Calcium Carbonate (CALCIUM 500 PO) Take by mouth.  . Coenzyme Q10 (CO Q-10) 200 MG CAPS Take 200 mg by mouth.  . escitalopram (LEXAPRO) 20 MG tablet Take 1 tablet (20 mg total) by mouth daily.  . fluticasone (FLONASE) 50 MCG/ACT nasal spray INHALE 2 SPRAYS IN EACH NOSTRIL EVERY DAY  . hydrochlorothiazide (HYDRODIURIL) 25 MG tablet Take 1 tablet (25 mg total) by mouth daily.  . mesalamine (APRISO) 0.375 g 24 hr capsule Take 4 capsules (1.5 g total) by mouth daily. PLEASE KEEP 02-25-20 APPT FOR FURTHER REFILLS  . mirabegron ER (MYRBETRIQ) 50 MG TB24 tablet Take 1 tablet (50 mg total) by mouth daily.  . vitamin B-12 (CYANOCOBALAMIN) 1000 MCG tablet Take 1 tablet (1,000 mcg total) by  mouth daily.  . rosuvastatin (CRESTOR) 20 MG tablet Take 1 tablet (20 mg total) by mouth daily.   No facility-administered encounter medications on file as of 01/22/2020.    Allergies (verified) Simvastatin, Amoxicillin, Clarithromycin, Doxycycline, Prednisone, and  Sulfonamide derivatives   History: Past Medical History:  Diagnosis Date  . Allergic rhinitis   . Allergy   . Anxiety   . Arthritis   . Cataract    bilat removed   . Chronic colitis   . Crohn's colitis ([place])   . Diverticulosis   . Fundic gland polyps of stomach, benign   . Gallbladder polyp 2017  . Gastric heterotopia   . GERD (gastroesophageal reflux disease)   . Hyperlipidemia   . Hyperplastic colon polyp   . Hypertension   . Internal hemorrhoids   . Obesity   . Serrated adenoma of colon   . Sleep apnea   . Tubular adenoma of colon    Past Surgical History:  Procedure Laterality Date  . BLADDER SUSPENSION  2011  . cataract surgery Bilateral 2017  . COLONOSCOPY    . FOOT SURGERY Left 2005  . NASAL SINUS SURGERY  2012  . POLYPECTOMY    . [place]  . UPPER GASTROINTESTINAL ENDOSCOPY     Family History  Problem Relation Age of Onset  . Diabetes Father   . Hypertension Father   . Heart disease Father   . Hypertension Mother   . Heart disease Mother   . Colon cancer Neg Hx   . Esophageal cancer Neg Hx   . Rectal cancer Neg Hx   . Stomach cancer Neg Hx   . Colon polyps Neg Hx    Social History   Socioeconomic History  . Marital status: Married    Spouse name: Not on file  . Number of children: 1  . Years of education: Not on file  . Highest education level: Not on file  Occupational History  . Occupation: office clerk/ retired    Fish farm manager: [employer]  Tobacco Use  . Smoking status: Former Smoker    Packs/day: 1.00    Years: 20.00    Pack years: 20.00    Types: Cigarettes    Quit date: 03/13/1981    Years since quitting: 38.8  . Smokeless tobacco: Never Used  Vaping Use  . Vaping Use: Never used  Substance and Sexual Activity  . Alcohol use: No  . Drug use: No  . Sexual activity: Not Currently  Other Topics Concern  . Not on file  Social History Narrative   No exercise      Retiring in June 2017   Social Determinants of Health    Financial Resource Strain: Low Risk   . Difficulty of Paying Living Expenses: Not hard at all  Food Insecurity: No Food Insecurity  . Worried About Charity fundraiser in the Last Year: Never true  . Ran Out of Food in the Last Year: Never true  Transportation Needs: No Transportation Needs  . Lack of Transportation (Medical): No  . Lack of Transportation (Non-Medical): No  Physical Activity: Inactive  . Days of Exercise per Week: 0 days  . Minutes of Exercise per Session: 0 min  Stress: No Stress Concern Present  . Feeling of Stress : Not at all  Social Connections: Socially Integrated  . Frequency of Communication with Friends and Family: More than three times a week  . Frequency of Social Gatherings with Friends and Family: Once a  week  . Attends Religious Services: More than 4 times per year  . Active Member of Clubs or Organizations: Yes  . Attends Archivist Meetings: More than 4 times per year  . Marital Status: Married    Tobacco Counseling Counseling given: Not Answered   Clinical Intake:  Pre-visit preparation completed: Yes  Pain : No/denies pain     Nutritional Risks: None Diabetes: No  How often do you need to have someone help you when you read instructions, pamphlets, or other written materials from your doctor or pharmacy?: 1 - Never What is the last grade level you completed in school?: Associates Degree  Diabetic? no  Interpreter Needed?: No  Information entered by :: Kayla Abu, LPN   Activities of Daily Living In your present state of health, do you have any difficulty performing the following activities: 01/22/2020  Hearing? N  Vision? N  Difficulty concentrating or making decisions? N  Walking or climbing stairs? N  Dressing or bathing? N  Doing errands, shopping? N  Preparing Food and eating ? N  Using the Toilet? N  In the past six months, have you accidently leaked urine? Y  Do you have problems with loss of bowel  control? N  Managing your Medications? N  Managing your Finances? N  Housekeeping or managing your Housekeeping? N  Some recent data might be hidden    Patient Care Team: Binnie Rail, MD as PCP - General (Internal Medicine) Pyrtle, Lajuan Lines, MD as Consulting Physician (Gastroenterology) Ernestine Conrad, MD as Referring Physician (Otolaryngology) Servando Salina, MD as Consulting Physician (Obstetrics and Gynecology) Bjorn Loser, MD as Consulting Physician (Urology) Debara Pickett Nadean Corwin, MD as Consulting Physician (Cardiology)  Indicate any recent Medical Services you may have received from other than [facility] providers in the past year (date may be approximate).     Assessment:   This is a routine wellness examination for Sheriann.  Hearing/Vision screen No exam data present  Dietary issues and exercise activities discussed: Current Exercise Habits: The patient does not participate in regular exercise at present, Exercise limited by: None identified  Goals    . Patient Stated     Maintain current health status. Enjoy life and family.    . Take 30 minutes in the afternoon just for myself     To enjoy quite, peace and time just for myself.      Depression Screen PHQ 2/9 Scores 01/22/2020 10/28/2019 04/22/2019 01/16/2019 11/29/2017 11/27/2016 04/14/2016  PHQ - 2 Score 0 0 0 0 1 2 0  PHQ- 9 Score - 2 2 0 2 6 -    Fall Risk Fall Risk  01/22/2020 04/21/2019 01/16/2019 11/29/2017 11/27/2016  Falls in the past year? 0 0 0 No No  Number falls in past yr: 0 0 0 - -  Injury with Fall? 0 - 0 - -  Risk for fall due to : No Fall Risks - Impaired balance/gait - Impaired balance/gait  Follow up Falls evaluation completed - - - -    Any stairs in or around the home? No  If so, are there any without handrails? No  Home free of loose throw rugs in walkways, pet beds, electrical cords, etc? Yes  Adequate lighting in your home to reduce risk of falls? Yes   ASSISTIVE DEVICES UTILIZED TO  PREVENT FALLS:  Life alert? No  Use of a cane, walker or w/c? No  Grab bars in the bathroom? Yes  Shower chair or bench  in shower? No  Elevated toilet seat or a handicapped toilet? Yes   TIMED UP AND GO:  Was the test performed? No .  Length of time to ambulate 10 feet: 0 sec.   Gait steady and fast without use of assistive device  Cognitive Function: MMSE - Mini Mental State Exam 11/27/2016  Orientation to time 5  Orientation to Place 5  Registration 3  Attention/ Calculation 5  Recall 3  Language- name 2 objects 2  Language- repeat 1  Language- follow 3 step command 3  Language- read & follow direction 1  Write a sentence 1  Copy design 1  Total score 30        Immunizations Immunization History  Administered Date(s) Administered  . Fluad Quad(high Dose 65+) 12/06/2018  . Influenza Split 12/11/2012  . Influenza Whole 01/17/2010  . Influenza, High Dose Seasonal PF 11/27/2016, 11/29/2017  . Influenza,inj,Quad PF,6+ Mos 01/10/2014  . Influenza-Unspecified 12/12/2014  . PFIZER SARS-COV-2 Vaccination 04/11/2019, 05/06/2019  . Pneumococcal Conjugate-13 10/28/2013  . Pneumococcal Polysaccharide-23 01/11/2010, 05/04/2015  . Tdap 10/28/2013  . Zoster 03/14/2011    TDAP status: Up to date Flu Vaccine status: Up to date Pneumococcal vaccine status: Up to date Covid-19 vaccine status: Completed vaccines  Qualifies for Shingles Vaccine? Yes   Zostavax completed Yes   Shingrix Completed?: No.    Education has been provided regarding the importance of this vaccine. Patient has been advised to call insurance company to determine out of pocket expense if they have not yet received this vaccine. Advised may also receive vaccine at local pharmacy or Health Dept. Verbalized acceptance and understanding.  Screening Tests Health Maintenance  Topic Date Due  . MAMMOGRAM  04/06/2019  . COLONOSCOPY  08/23/2019  . INFLUENZA VACCINE  10/12/2019  . DEXA SCAN  10/24/2023  .  TETANUS/TDAP  10/29/2023  . COVID-19 Vaccine  Completed  . Hepatitis C Screening  Completed  . PNA vac Low Risk Adult  Completed    Health Maintenance  Health Maintenance Due  Topic Date Due  . MAMMOGRAM  04/06/2019  . COLONOSCOPY  08/23/2019  . INFLUENZA VACCINE  10/12/2019    Colorectal cancer screening: Completed 08/22/2016. Repeat every 3 years (scheduled for 03/10/2020) Mammogram status: Completed 04/07/2019. Repeat every year Bone Density status: Completed 10/24/2018. Results reflect: Bone density results: NORMAL. Repeat every 5 years.  Lung Cancer Screening: (Low Dose CT Chest recommended if Age 13-80 years, 30 pack-year currently smoking OR have quit w/in 15years.) does qualify.   Lung Cancer Screening Referral: no  Additional Screening:  Hepatitis C Screening: does qualify; Completed yes  Vision Screening: Recommended annual ophthalmology exams for early detection of glaucoma and other disorders of the eye. Is the patient up to date with their annual eye exam?  Yes  Who is the provider or what is the name of the office in which the patient attends annual eye exams? Ray A. Leonia Corona, OD If pt is not established with a provider, would they like to be referred to a provider to establish care? No .   Dental Screening: Recommended annual dental exams for proper oral hygiene  Community Resource Referral / Chronic Care Management: CRR required this visit?  No   CCM required this visit?  No      Plan:     I have personally reviewed and noted the following in the patient's chart:   . Medical and social history . Use of alcohol, tobacco or illicit drugs  . Current  medications and supplements . Functional ability and status . Nutritional status . Physical activity . Advanced directives . List of other physicians . Hospitalizations, surgeries, and ER visits in previous 12 months . Vitals . Screenings to include cognitive, depression, and falls . Referrals and  appointments  In addition, I have reviewed and discussed with patient certain preventive protocols, quality metrics, and best practice recommendations. A written personalized care plan for preventive services as well as general preventive health recommendations were provided to patient.     Kayla Flow, LPN   [id number]   Nurse Notes:  Patient is cogitatively intact. There were no vitals filed for this visit. There is no height or weight on file to calculate BMI. Patient stated that she has no issues with gait or balance; does not use any assistive devices.

## 2020-01-22 NOTE — Patient Instructions (Signed)
Kayla Maddox , Thank you for taking time to come for your Medicare Wellness Visit. I appreciate your ongoing commitment to your health goals. Please review the following plan we discussed and let me know if I can assist you in the future.   Screening recommendations/referrals: Colonoscopy: 08/22/2016; due every 3 years (scheduled for 03/10/2020) Mammogram: 04/07/2019 Bone Density: 10/24/2018; due every 5 years (normal results) Recommended yearly ophthalmology/optometry visit for glaucoma screening and checkup Recommended yearly dental visit for hygiene and checkup  Vaccinations: Influenza vaccine: 11/2019 ([facility]) Pneumococcal vaccine: up to date Tdap vaccine: 10/28/2013; due every 10 years Shingles vaccine: never done   Covid-19: up to date  Advanced directives: Please bring a copy of your health care power of attorney and living will to the office at your convenience.  Conditions/risks identified: Yes; Reviewed health maintenance screenings with patient today and relevant education, vaccines, and/or referrals were provided. Please continue to do your personal lifestyle choices by: daily care of teeth and gums, regular physical activity (goal should be 5 days a week for 30 minutes), eat a healthy diet, avoid tobacco and drug use, limiting any alcohol intake, taking a low-dose aspirin (if not allergic or have been advised by your provider otherwise) and taking vitamins and minerals as recommended by your provider. Continue doing brain stimulating activities (puzzles, reading, adult coloring books, staying active) to keep memory sharp. Continue to eat heart healthy diet (full of fruits, vegetables, whole grains, lean protein, water--limit salt, fat, and sugar intake) and increase physical activity as tolerated.  Next appointment: Please schedule your next Medicare Wellness Visit with your Nurse Health Advisor in 1 year by calling [phone].   Preventive Care 27 Years and Older,  Female Preventive care refers to lifestyle choices and visits with your health care provider that can promote health and wellness. What does preventive care include?  A yearly physical exam. This is also called an annual well check.  Dental exams once or twice a year.  Routine eye exams. Ask your health care provider how often you should have your eyes checked.  Personal lifestyle choices, including:  Daily care of your teeth and gums.  Regular physical activity.  Eating a healthy diet.  Avoiding tobacco and drug use.  Limiting alcohol use.  Practicing safe sex.  Taking low-dose aspirin every day.  Taking vitamin and mineral supplements as recommended by your health care provider. What happens during an annual well check? The services and screenings done by your health care provider during your annual well check will depend on your age, overall health, lifestyle risk factors, and family history of disease. Counseling  Your health care provider may ask you questions about your:  Alcohol use.  Tobacco use.  Drug use.  Emotional well-being.  Home and relationship well-being.  Sexual activity.  Eating habits.  History of falls.  Memory and ability to understand (cognition).  Work and work Statistician.  Reproductive health. Screening  You may have the following tests or measurements:  Height, weight, and BMI.  Blood pressure.  Lipid and cholesterol levels. These may be checked every 5 years, or more frequently if you are over 60 years old.  Skin check.  Lung cancer screening. You may have this screening every year starting at age 75 if you have a 30-pack-year history of smoking and currently smoke or have quit within the past 15 years.  Fecal occult blood test (FOBT) of the stool. You may have this test every year starting at age 82.  Flexible sigmoidoscopy or colonoscopy. You may have a sigmoidoscopy every 5 years or a colonoscopy every 10 years  starting at age 3.  Hepatitis C blood test.  Hepatitis B blood test.  Sexually transmitted disease (STD) testing.  Diabetes screening. This is done by checking your blood sugar (glucose) after you have not eaten for a while (fasting). You may have this done every 1-3 years.  Bone density scan. This is done to screen for osteoporosis. You may have this done starting at age 4.  Mammogram. This may be done every 1-2 years. Talk to your health care provider about how often you should have regular mammograms. Talk with your health care provider about your test results, treatment options, and if necessary, the need for more tests. Vaccines  Your health care provider may recommend certain vaccines, such as:  Influenza vaccine. This is recommended every year.  Tetanus, diphtheria, and acellular pertussis (Tdap, Td) vaccine. You may need a Td booster every 10 years.  Zoster vaccine. You may need this after age 87.  Pneumococcal 13-valent conjugate (PCV13) vaccine. One dose is recommended after age 21.  Pneumococcal polysaccharide (PPSV23) vaccine. One dose is recommended after age 71. Talk to your health care provider about which screenings and vaccines you need and how often you need them. This information is not intended to replace advice given to you by your health care provider. Make sure you discuss any questions you have with your health care provider. Document Released: 03/26/2015 Document Revised: 11/17/2015 Document Reviewed: 12/29/2014 Elsevier Interactive Patient Education  2017 [place] Prevention in the Home Falls can cause injuries. They can happen to people of all ages. There are many things you can do to make your home safe and to help prevent falls. What can I do on the outside of my home?  Regularly fix the edges of walkways and driveways and fix any cracks.  Remove anything that might make you trip as you walk through a door, such as a raised step or  threshold.  Trim any bushes or trees on the path to your home.  Use bright outdoor lighting.  Clear any walking paths of anything that might make someone trip, such as rocks or tools.  Regularly check to see if handrails are loose or broken. Make sure that both sides of any steps have handrails.  Any raised decks and porches should have guardrails on the edges.  Have any leaves, snow, or ice cleared regularly.  Use sand or salt on walking paths during winter.  Clean up any spills in your garage right away. This includes oil or grease spills. What can I do in the bathroom?  Use night lights.  Install grab bars by the toilet and in the tub and shower. Do not use towel bars as grab bars.  Use non-skid mats or decals in the tub or shower.  If you need to sit down in the shower, use a plastic, non-slip stool.  Keep the floor dry. Clean up any water that spills on the floor as soon as it happens.  Remove soap buildup in the tub or shower regularly.  Attach bath mats securely with double-sided non-slip rug tape.  Do not have throw rugs and other things on the floor that can make you trip. What can I do in the bedroom?  Use night lights.  Make sure that you have a light by your bed that is easy to reach.  Do not use any sheets or blankets  that are too big for your bed. They should not hang down onto the floor.  Have a firm chair that has side arms. You can use this for support while you get dressed.  Do not have throw rugs and other things on the floor that can make you trip. What can I do in the kitchen?  Clean up any spills right away.  Avoid walking on wet floors.  Keep items that you use a lot in easy-to-reach places.  If you need to reach something above you, use a strong step stool that has a grab bar.  Keep electrical cords out of the way.  Do not use floor polish or wax that makes floors slippery. If you must use wax, use non-skid floor wax.  Do not have  throw rugs and other things on the floor that can make you trip. What can I do with my stairs?  Do not leave any items on the stairs.  Make sure that there are handrails on both sides of the stairs and use them. Fix handrails that are broken or loose. Make sure that handrails are as long as the stairways.  Check any carpeting to make sure that it is firmly attached to the stairs. Fix any carpet that is loose or worn.  Avoid having throw rugs at the top or bottom of the stairs. If you do have throw rugs, attach them to the floor with carpet tape.  Make sure that you have a light switch at the top of the stairs and the bottom of the stairs. If you do not have them, ask someone to add them for you. What else can I do to help prevent falls?  Wear shoes that:  Do not have high heels.  Have rubber bottoms.  Are comfortable and fit you well.  Are closed at the toe. Do not wear sandals.  If you use a stepladder:  Make sure that it is fully opened. Do not climb a closed stepladder.  Make sure that both sides of the stepladder are locked into place.  Ask someone to hold it for you, if possible.  Clearly mark and make sure that you can see:  Any grab bars or handrails.  First and last steps.  Where the edge of each step is.  Use tools that help you move around (mobility aids) if they are needed. These include:  Canes.  Walkers.  Scooters.  Crutches.  Turn on the lights when you go into a dark area. Replace any light bulbs as soon as they burn out.  Set up your furniture so you have a clear path. Avoid moving your furniture around.  If any of your floors are uneven, fix them.  If there are any pets around you, be aware of where they are.  Review your medicines with your doctor. Some medicines can make you feel dizzy. This can increase your chance of falling. Ask your doctor what other things that you can do to help prevent falls. This information is not intended to  replace advice given to you by your health care provider. Make sure you discuss any questions you have with your health care provider. Document Released: 12/24/2008 Document Revised: 08/05/2015 Document Reviewed: 04/03/2014 Elsevier Interactive Patient Education  2017 [employer].

## 2020-02-16 DIAGNOSIS — Z7189 Other specified counseling: Secondary | ICD-10-CM | POA: Diagnosis not present

## 2020-02-16 DIAGNOSIS — L814 Other melanin hyperpigmentation: Secondary | ICD-10-CM | POA: Diagnosis not present

## 2020-02-16 DIAGNOSIS — L918 Other hypertrophic disorders of the skin: Secondary | ICD-10-CM | POA: Diagnosis not present

## 2020-02-16 DIAGNOSIS — L821 Other seborrheic keratosis: Secondary | ICD-10-CM | POA: Diagnosis not present

## 2020-02-16 DIAGNOSIS — D0462 Carcinoma in situ of skin of left upper limb, including shoulder: Secondary | ICD-10-CM | POA: Diagnosis not present

## 2020-02-16 DIAGNOSIS — Z711 Person with feared health complaint in whom no diagnosis is made: Secondary | ICD-10-CM | POA: Diagnosis not present

## 2020-02-16 DIAGNOSIS — D485 Neoplasm of uncertain behavior of skin: Secondary | ICD-10-CM | POA: Diagnosis not present

## 2020-02-16 DIAGNOSIS — D1801 Hemangioma of skin and subcutaneous tissue: Secondary | ICD-10-CM | POA: Diagnosis not present

## 2020-02-17 DIAGNOSIS — J189 Pneumonia, unspecified organism: Secondary | ICD-10-CM | POA: Diagnosis not present

## 2020-02-17 DIAGNOSIS — T161XXA Foreign body in right ear, initial encounter: Secondary | ICD-10-CM | POA: Diagnosis not present

## 2020-02-17 DIAGNOSIS — R0902 Hypoxemia: Secondary | ICD-10-CM | POA: Diagnosis not present

## 2020-02-17 DIAGNOSIS — J069 Acute upper respiratory infection, unspecified: Secondary | ICD-10-CM | POA: Diagnosis not present

## 2020-02-23 DIAGNOSIS — D0462 Carcinoma in situ of skin of left upper limb, including shoulder: Secondary | ICD-10-CM | POA: Diagnosis not present

## 2020-03-10 ENCOUNTER — Encounter: Payer: Medicare PPO | Admitting: Internal Medicine

## 2020-03-22 ENCOUNTER — Telehealth: Payer: Self-pay | Admitting: Internal Medicine

## 2020-03-22 MED ORDER — MESALAMINE ER 0.375 G PO CP24: 1500.0000 mg | ORAL_CAPSULE | Freq: Every day | ORAL | 0 refills | Status: DC

## 2020-03-22 NOTE — Telephone Encounter (Signed)
Patient requesting Mesalamine refill

## 2020-03-26 ENCOUNTER — Telehealth: Payer: Self-pay | Admitting: Internal Medicine

## 2020-03-26 NOTE — Telephone Encounter (Signed)
Spoke with patient in regards to establishing care with a cardiologist. Informed patient Dr. Debara Pickett is able to be her cardiologist if she would like to continue to see him. Patient reports she thought Dr. Debara Pickett only took care of cholesterol but would like to see him as her cardiologist if possible.   Patient reports she saw her PCP and had one of her "coughing spells" to which the doctor requested she see cardiology for. Patient is on rosuvastatin. The cough is productive at times and has been ongoing for awhile. Patient does not want to see an APP. Next available appointment made with Dr. Debara Pickett for 3/21.  Will send to scheduling to place on cancellation list.  Will send to MD and RN for review.

## 2020-03-26 NOTE — Telephone Encounter (Signed)
Thanks. I'd be happy to see her for general cardiology needs.  Dr Lemmie Evens

## 2020-03-26 NOTE — Telephone Encounter (Signed)
Kayla Maddox is calling stating she is wanting to establish care with a general cardiologist at the [facility] office. She was requesting Dr. Rayann Heman since that is who her husbands sees, but I advised her he is EP. Due to this she is requesting a referral to [facility] from Dr. Debara Pickett with his recommendation on a provider. She states Dr. Debara Pickett released her from his care for Lipid, but she was recently advised she needs to see a Cardiologist. Please advise.

## 2020-03-30 NOTE — Telephone Encounter (Signed)
Spoke with patient and scheduled for sooner OV on 04/06/20 @ 4pm

## 2020-04-05 ENCOUNTER — Ambulatory Visit (AMBULATORY_SURGERY_CENTER): Payer: Self-pay | Admitting: *Deleted

## 2020-04-05 ENCOUNTER — Other Ambulatory Visit: Payer: Self-pay

## 2020-04-05 VITALS — Ht 65.0 in | Wt 230.0 lb

## 2020-04-05 DIAGNOSIS — K50119 Crohn's disease of large intestine with unspecified complications: Secondary | ICD-10-CM

## 2020-04-05 DIAGNOSIS — Z86010 Personal history of colonic polyps: Secondary | ICD-10-CM

## 2020-04-05 DIAGNOSIS — R918 Other nonspecific abnormal finding of lung field: Secondary | ICD-10-CM

## 2020-04-05 MED ORDER — NA SULFATE-K SULFATE-MG SULF 17.5-3.13-1.6 GM/177ML PO SOLN: ORAL | 0 refills | Status: DC

## 2020-04-05 NOTE — Progress Notes (Signed)
Patient is here in-person for PV. Patient denies any allergies to eggs or soy. Patient denies any problems with anesthesia/sedation. Patient denies any oxygen use at home. Patient denies taking any diet/weight loss medications or blood thinners. Patient is not being treated for MRSA or C-diff. Patient is aware of our care-partner policy and ZGYFV-49 safety protocol. EMMI education assigned to the patient for the procedure, sent to [place].   COVID-19 vaccines completed on 01/2020 x3, per patient. Patient is to see cardiologist on 04/06/2020 for a cough. She will call us with the results. She is aware she will need clearance from him and any test completed before colon can be done.

## 2020-04-06 ENCOUNTER — Telehealth: Payer: Self-pay | Admitting: *Deleted

## 2020-04-06 ENCOUNTER — Ambulatory Visit: Payer: Medicare PPO | Admitting: Internal Medicine

## 2020-04-06 ENCOUNTER — Encounter: Payer: Self-pay | Admitting: Internal Medicine

## 2020-04-06 VITALS — BP 125/62 | HR 80 | Ht 65.0 in | Wt 232.2 lb

## 2020-04-06 DIAGNOSIS — R058 Other specified cough: Secondary | ICD-10-CM

## 2020-04-06 DIAGNOSIS — R0602 Shortness of breath: Secondary | ICD-10-CM

## 2020-04-06 DIAGNOSIS — G4733 Obstructive sleep apnea (adult) (pediatric): Secondary | ICD-10-CM | POA: Diagnosis not present

## 2020-04-06 DIAGNOSIS — R0600 Dyspnea, unspecified: Secondary | ICD-10-CM | POA: Diagnosis not present

## 2020-04-06 DIAGNOSIS — R931 Abnormal findings on diagnostic imaging of heart and coronary circulation: Secondary | ICD-10-CM | POA: Diagnosis not present

## 2020-04-06 DIAGNOSIS — R0609 Other forms of dyspnea: Secondary | ICD-10-CM

## 2020-04-06 NOTE — Progress Notes (Signed)
LIPID CLINIC CONSULT NOTE  Chief Complaint:  Cough, dyspnea on exertion  Primary Care Physician: Kayla Rail, MD  Primary Cardiologist:  No primary care provider on file.  HPI:  Kayla Maddox is a 75 y.o. female who is being seen today for the evaluation of dyslipidemia at the request of Burns, Claudina Lick, MD.  This is a pleasant 75 year old female kindly referred for evaluation management of dyslipidemia.  She has no known history of coronary disease and few cardiovascular risk factors although her father did have bypass surgery and died of heart disease.  Her most recent cholesterol showed total of 226, triglycerides 243, HDL 50 and a direct LDL of 156.  Previously she had been on simvastatin which caused some muscle weakness and since has been on ezetimibe.  Her PCP noted that she has not had a significant reduction in her cholesterol on ezetimibe and therefore was referred for further evaluation.  Based on age and family history I would put her likely low to intermediate risk of heart disease and likely target LDL less than 100.  We also discussed the possibility of calcium scoring to further restratify her.  She also discussed today and issues she has been having with chronic cough.  She was told at one point that she should consider seeing a cardiologist because chronic cough could be related to heart failure.  I do not appreciate any signs or symptoms of heart failure and did examine her today.  11/27/2019  Kayla Maddox is seen today in follow-up.  Overall she is doing very well.  She is tolerating rosuvastatin without any significant side effects.  Her lipids are significantly improved with a total cholesterol of 150, triglycerides 167, HDL 54 and an LDL of 68.  She was noted to have some coronary artery calcification namely a calcium score of 31.5 which was 52nd percentile for age and sex matched control.  Calcium was noted predominantly in the right coronary artery.  Overall it is a fairly  low risk finding but abnormal and given her family history think that she has easily reached her LDL targets at this point.  04/06/2020  Kayla Maddox returns today for follow-up of her dyslipidemia.  In addition she wishes to see me as her general cardiologist.  More recently she has been having recurrent issues with nonproductive cough.  Actually this is a longstanding problem.  She says that she has been evaluated extensively by GI, ENT, neurology and other specialties for this.  They cannot figure out why she continues to have cough.  She also has had some shortness of breath.  This seems to be possibly associated with it.  It was noted that her oxygen saturation was 92% today after walking to the office and she said usually runs in the low 90% range.  After further questioning it seems that she has a history of obstructive sleep apnea, diagnosed over 15 years ago for which she used CPAP for short period of time but was not able to tolerate it and has not used it since then.  He denies any orthopnea but reports some shortness of breath particularly walking upstairs or doing more heavy housework.  There is no complaint of chest pain.  She apparently scheduled for an elective colonoscopy coming up in February.  EKG performed today shows normal sinus rhythm.  PMHx:  Past Medical History:  Diagnosis Date   Allergic rhinitis    Allergy    Anxiety    Arthritis  Cancer ([place])    skin cancer    Cataract    bilat removed    Chronic colitis    Crohn's colitis ([place])    Diverticulosis    Fundic gland polyps of stomach, benign    Gallbladder polyp 2017   Gastric heterotopia    GERD (gastroesophageal reflux disease)    Hyperlipidemia    Hyperplastic colon polyp    Hypertension    Internal hemorrhoids    Obesity    Serrated adenoma of colon    Sleep apnea    does not use CPAP   Tubular adenoma of colon     Past Surgical History:  Procedure Laterality Date   BLADDER  SUSPENSION  2011   cataract surgery Bilateral 2017   COLONOSCOPY  08/22/2016   [facility]   FOOT SURGERY Left 2005   NASAL SINUS SURGERY  2012   POLYPECTOMY     SPINE SURGERY  1994   UPPER GASTROINTESTINAL ENDOSCOPY  2014    FAMHx:  Family History  Problem Relation Age of Onset   Diabetes Father    Hypertension Father    Heart disease Father    Hypertension Mother    Heart disease Mother    Colon cancer Neg Hx    Esophageal cancer Neg Hx    Rectal cancer Neg Hx    Stomach cancer Neg Hx    Colon polyps Neg Hx     SOCHx:   reports that she quit smoking about 39 years ago. Her smoking use included cigarettes. She has a 20.00 pack-year smoking history. She has never used smokeless tobacco. She reports that she does not drink alcohol and does not use drugs.  ALLERGIES:  Allergies  Allergen Reactions   Simvastatin     Muscle weakness   Amoxicillin Rash   Clarithromycin Rash   Doxycycline Rash   Prednisone Rash   Sulfonamide Derivatives Rash    ROS: Pertinent items noted in HPI and remainder of comprehensive ROS otherwise negative.  HOME MEDS: Current Outpatient Medications on File Prior to Visit  Medication Sig Dispense Refill   ALPRAZolam (XANAX) 0.25 MG tablet Take 1 tablet (0.25 mg total) by mouth 2 (two) times daily as needed for anxiety. 60 tablet 0   Ascorbic Acid (VITAMIN C) 1000 MG tablet Take 1,000 mg by mouth daily.     benzonatate (TESSALON) 200 MG capsule Take 1 capsule (200 mg total) by mouth 3 (three) times daily as needed for cough. 90 capsule 1   Calcium Carbonate (CALCIUM 500 PO) Take by mouth.     Coenzyme Q10 (CO Q-10) 200 MG CAPS Take 200 mg by mouth.     escitalopram (LEXAPRO) 20 MG tablet Take 1 tablet (20 mg total) by mouth daily. 90 tablet 1   fluticasone (FLONASE) 50 MCG/ACT nasal spray INHALE 2 SPRAYS IN EACH NOSTRIL EVERY DAY 16 g 3   hydrochlorothiazide (HYDRODIURIL) 25 MG tablet Take 1 tablet (25 mg total) by  mouth daily. 90 tablet 3   mesalamine (APRISO) 0.375 g 24 hr capsule Take 4 capsules (1.5 g total) by mouth daily. NO FURTHER REFILLS IF 04/2020 APPT IS NOT KEPT 120 capsule 0   mirabegron ER (MYRBETRIQ) 50 MG TB24 tablet Take 1 tablet (50 mg total) by mouth daily. 90 tablet 1   Na Sulfate-K Sulfate-Mg Sulf 17.5-3.13-1.6 GM/177ML SOLN Suprep (no substitutions)-TAKE AS DIRECTED. 354 mL 0   vitamin B-12 (CYANOCOBALAMIN) 1000 MCG tablet Take 1 tablet (1,000 mcg total) by mouth daily.  rosuvastatin (CRESTOR) 20 MG tablet Take 1 tablet (20 mg total) by mouth daily. 90 tablet 3   No current facility-administered medications on file prior to visit.    LABS/IMAGING: No results found for this or any previous visit (from the past 48 hour(s)). No results found.  LIPID PANEL:    Component Value Date/Time   CHOL 150 11/19/2019 1106   TRIG 167 (H) 11/19/2019 1106   HDL 54 11/19/2019 1106   CHOLHDL 2.8 11/19/2019 1106   CHOLHDL 2.6 10/27/2019 1418   VLDL 48.6 (H) 04/21/2019 1214   LDLCALC 68 11/19/2019 1106   LDLCALC 65 10/27/2019 1418   LDLDIRECT 156.0 04/21/2019 1214    WEIGHTS: Wt Readings from Last 3 Encounters:  04/06/20 232 lb 3.2 oz (105.3 kg)  04/05/20 230 lb (104.3 kg)  11/27/19 228 lb 3.2 oz (103.5 kg)    VITALS: BP 125/62    Pulse 80    Ht 5' 5"  (1.651 m)    Wt 232 lb 3.2 oz (105.3 kg)    SpO2 92%    BMI 38.64 kg/m   EXAM: General appearance: alert and no distress Neck: no carotid bruit, no JVD and thyroid not enlarged, symmetric, no tenderness/mass/nodules Lungs: clear to auscultation bilaterally Heart: regular rate and rhythm Abdomen: soft, non-tender; bowel sounds normal; no masses,  no organomegaly Extremities: extremities normal, atraumatic, no cyanosis or edema Pulses: 2+ and symmetric Skin: Skin color, texture, turgor normal. No rashes or lesions Neurologic: Grossly normal Psych: Pleasant  EKG: Normal sinus rhythm at 80-personally  reviewed  ASSESSMENT: 1. Dyspnea on exertion/chronic cough 2. Mixed dyslipidemia 3. Low to intermediate cardiovascular risk -CAC score 31.5, 52nd percentile for age and sex matched control (06/2019) 4. Family history of coronary disease in her father 42. Chronic cough 6. Myalgia on simvastatin 7. OSA-noncompliant with CPAP  PLAN: 1.   Ms. Fraleigh today for evaluation of dyspnea on exertion and chronic cough.  She has had an extensive work-up for the cough which has been unrevealing.  The past she has taken reflux medication but is not currently on it.  She also uses nasal sprays which does not seem to improve her symptoms although does report some chronic sinus issues.  She has previously had a sinus surgery.  The cough could also be due to heart failure although I am less suspect of that.  She is having shortness of breath however which is exertional and could indicate coronary artery disease or perhaps be related to deconditioning and obesity.  In addition, it sounds like she has been noncompliant with CPAP having had a diagnosis of sleep apnea many years ago.  Her equipment is old and likely not functional at this point.  I would recommend a repeat sleep study with a split-night to see if we can get her reestablished on treatment.  She is agreeable to this.  She may very well just have a degree of atelectasis and hypoxemia related to apnea/hypopnea from untreated OSA that could be causing her cough.  With regards to dyspnea we will get an echocardiogram to assess for LV function, valvular abnormalities, diastolic function and pulmonary pressures given concern for upper airway resistance syndrome and the effects of untreated sleep apnea on the right heart.  She may ultimately need a coronary evaluation as well other did have a negative Myoview stress test 2018.  I would recommend rescheduling her elective colonoscopy which is scheduled for February 3 with Dr. Hilarie Fredrickson to push out somewhere at 3 to 6 months  from now to allow me to complete her work-up.  Follow-up with me afterwards.  Pixie Casino, MD, [facility], [place] Director of the Advanced Lipid Disorders &  Cardiovascular Risk Reduction Clinic Diplomate of the American Board of Clinical Lipidology Attending Cardiologist  Direct Dial: [phone]   Fax: [phone]  Website:  [email or web address] 04/06/2020, 4:34 PM

## 2020-04-06 NOTE — Telephone Encounter (Signed)
The patient had a colonoscopy scheduled for 2/3 but she recently had cardiologist OV and they requested echo. And may do further test after that per pt. So I cancelled her colonoscopy until after she completes the cardiologist work up. She will call us back to reschedule once she is cleared by the cardiologist. The patient is asking for a refill of her Mesalamine. She states she will run out before the colonoscopy visit. Please advise.

## 2020-04-06 NOTE — Telephone Encounter (Signed)
Please call the patient.

## 2020-04-06 NOTE — Patient Instructions (Signed)
  Testing/Procedures:  Your physician has requested that you have an echocardiogram. Echocardiography is a painless test that uses sound waves to create images of your heart. It provides your doctor with information about the size and shape of your heart and how well your heart's chambers and valves are working. This procedure takes approximately one hour. There are no restrictions for this procedure.Lindy has recommended that you have a sleep study. This test records several body functions during sleep, including: brain activity, eye movement, oxygen and carbon dioxide blood levels, heart rate and rhythm, breathing rate and rhythm, the flow of air through your mouth and nose, snoring, body muscle movements, and chest and belly movement.[place]     Follow-Up: At [facility], you and your health needs are our priority.  As part of our continuing mission to provide you with exceptional heart care, we have created designated Provider Care Teams.  These Care Teams include your primary Cardiologist (physician) and Advanced Practice Providers (APPs -  Physician Assistants and Nurse Practitioners) who all work together to provide you with the care you need, when you need it.  We recommend signing up for the patient portal called "MyChart".  Sign up information is provided on this After Visit Summary.  MyChart is used to connect with patients for Virtual Visits (Telemedicine).  Patients are able to view lab/test results, encounter notes, upcoming appointments, etc.  Non-urgent messages can be sent to your provider as well.   To learn more about what you can do with MyChart, go to [email or web address].    Your next appointment:   4-6 week(s)  The format for your next appointment:   In Person  Provider:   K. Mali Hilty, MD

## 2020-04-07 ENCOUNTER — Telehealth: Payer: Self-pay | Admitting: *Deleted

## 2020-04-07 DIAGNOSIS — Z1231 Encounter for screening mammogram for malignant neoplasm of breast: Secondary | ICD-10-CM | POA: Diagnosis not present

## 2020-04-07 LAB — HM MAMMOGRAPHY

## 2020-04-07 MED ORDER — MESALAMINE ER 0.375 G PO CP24: 1500.0000 mg | ORAL_CAPSULE | Freq: Every day | ORAL | 2 refills | Status: DC

## 2020-04-07 NOTE — Telephone Encounter (Signed)
Recall placed in epic for colonoscopy 10/11/2020  ===View-only below this line=== ----- Message ----- From: Jerene Bears, MD Sent: 04/07/2020  12:26 PM EST To: Pixie Casino, MD, Larina Bras, CMA  Mali Thanks for your help. I will delay any endoscopic plans. Thanks Willis Modena Delay plans for colonoscopy x 6 months JMP  ----- Message ----- From: Pixie Casino, MD Sent: 04/07/2020   8:15 AM EST To: Jerene Bears, MD, Binnie Rail, MD  I saw Ms. Tramel for work-up of dyspnea, cough today - will obtain echo and have rescheduled sleep study since she has untreated OSA, diagnosed 15 years ago. May need to schedule an ischemia evaluation as well - would recommend rescheduling her elective colonoscopy for a later date (3-6 months from now) to allow me to complete a work-up. Thanks, -Mali

## 2020-04-09 ENCOUNTER — Encounter: Payer: Self-pay | Admitting: Internal Medicine

## 2020-04-09 NOTE — Progress Notes (Signed)
Outside notes received. Information abstracted. Notes sent to scan.  

## 2020-04-15 ENCOUNTER — Encounter: Payer: Medicare PPO | Admitting: Internal Medicine

## 2020-04-29 ENCOUNTER — Ambulatory Visit (HOSPITAL_COMMUNITY): Payer: Medicare PPO | Attending: Internal Medicine

## 2020-04-29 ENCOUNTER — Other Ambulatory Visit: Payer: Self-pay

## 2020-04-29 DIAGNOSIS — R0609 Other forms of dyspnea: Secondary | ICD-10-CM

## 2020-04-29 DIAGNOSIS — R0600 Dyspnea, unspecified: Secondary | ICD-10-CM | POA: Diagnosis not present

## 2020-04-29 LAB — ECHOCARDIOGRAM COMPLETE
Area-P 1/2: 3.37 cm2
S' Lateral: 3 cm

## 2020-04-29 MED ORDER — PERFLUTREN LIPID MICROSPHERE
1.0000 mL | INTRAVENOUS | Status: AC | PRN
  Administered 2020-04-29: 3 mL via INTRAVENOUS

## 2020-05-04 ENCOUNTER — Other Ambulatory Visit: Payer: Self-pay

## 2020-05-04 NOTE — Patient Instructions (Signed)
  Medications changes include :   none    A referral was ordered for sports medicine.       Someone from their office will call you to schedule an appointment.     Please followup in 6 months

## 2020-05-04 NOTE — Progress Notes (Signed)
Subjective:    Patient ID: Kayla Maddox, female    DOB: 23-Apr-1945, 75 y.o.   MRN: [id number]  HPI The patient is here for follow up of their chronic medical problems, including htn, hyperlipidemia, anxiety, depression, prediabetes, overactive bladder  She is taking all of her medications as prescribed.   She has had pian in her right buttock - ischium - no injury. Increased pain with sitting.  Getting up and down and walking increases pain.  Initially sharp pain and now dull ache.  No pain into leg or N/T.  Has not taken anything.   Her meds are working well.  She denies other concerns.   Medications and allergies reviewed with patient and updated if appropriate.  Patient Active Problem List   Diagnosis Date Noted  . Fatigue 10/27/2019  . Foot pain, right 10/17/2018  . Neuropathy 10/17/2018  . Obesity (BMI 30-39.9) 07/10/2017  . Esophageal spasm 01/08/2017  . Overactive bladder 01/07/2017  . B12 deficiency 11/26/2016  . Prediabetes 05/06/2015  . Fatty liver 05/04/2015  . Dizziness 06/12/2014  . Chest tightness 04/29/2014  . Flushing 04/29/2014  . Back pain with sciatica 11/06/2013  . Anxiety and depression 10/28/2013  . Crohn's colitis ([place]) 10/21/2013  . Adenomatous colon polyp 10/21/2013  . Gastroesophageal reflux disease without esophagitis 10/21/2013  . Hyperlipidemia 02/10/2010  . Essential hypertension 02/10/2010  . Asthma 02/10/2010  . OSA (obstructive sleep apnea) 02/10/2010  . DYSPNEA 02/10/2010  . Upper airway cough syndrome 02/09/2010    Current Outpatient Medications on File Prior to Visit  Medication Sig Dispense Refill  . ALPRAZolam (XANAX) 0.25 MG tablet Take 1 tablet (0.25 mg total) by mouth 2 (two) times daily as needed for anxiety. 60 tablet 0  . Ascorbic Acid (VITAMIN C) 1000 MG tablet Take 1,000 mg by mouth daily.    . benzonatate (TESSALON) 200 MG capsule Take 1 capsule (200 mg total) by mouth 3 (three) times daily as needed for cough. 90  capsule 1  . Calcium Carbonate (CALCIUM 500 PO) Take by mouth.    . Coenzyme Q10 (CO Q-10) 200 MG CAPS Take 200 mg by mouth.    . escitalopram (LEXAPRO) 20 MG tablet Take 1 tablet (20 mg total) by mouth daily. 90 tablet 1  . fluticasone (FLONASE) 50 MCG/ACT nasal spray INHALE 2 SPRAYS IN EACH NOSTRIL EVERY DAY 16 g 3  . hydrochlorothiazide (HYDRODIURIL) 25 MG tablet Take 1 tablet (25 mg total) by mouth daily. 90 tablet 3  . mesalamine (APRISO) 0.375 g 24 hr capsule Take 4 capsules (1.5 g total) by mouth daily. 120 capsule 2  . mirabegron ER (MYRBETRIQ) 50 MG TB24 tablet Take 1 tablet (50 mg total) by mouth daily. 90 tablet 1  . Na Sulfate-K Sulfate-Mg Sulf 17.5-3.13-1.6 GM/177ML SOLN Suprep (no substitutions)-TAKE AS DIRECTED. 354 mL 0  . vitamin B-12 (CYANOCOBALAMIN) 1000 MCG tablet Take 1 tablet (1,000 mcg total) by mouth daily.    . rosuvastatin (CRESTOR) 20 MG tablet Take 1 tablet (20 mg total) by mouth daily. 90 tablet 3   No current facility-administered medications on file prior to visit.    Past Medical History:  Diagnosis Date  . Allergic rhinitis   . Allergy   . Anxiety   . Arthritis   . Cancer ([place])    skin cancer   . Cataract    bilat removed   . Chronic colitis   . Crohn's colitis ([place])   . Diverticulosis   . Fundic  gland polyps of stomach, benign   . Gallbladder polyp 2017  . Gastric heterotopia   . GERD (gastroesophageal reflux disease)   . Hyperlipidemia   . Hyperplastic colon polyp   . Hypertension   . Internal hemorrhoids   . Obesity   . Serrated adenoma of colon   . Sleep apnea    does not use CPAP  . Tubular adenoma of colon     Past Surgical History:  Procedure Laterality Date  . BLADDER SUSPENSION  2011  . cataract surgery Bilateral 2017  . COLONOSCOPY  08/22/2016   [facility]  . FOOT SURGERY Left 2005  . NASAL SINUS SURGERY  2012  . POLYPECTOMY    . [place]  . UPPER GASTROINTESTINAL ENDOSCOPY  2014    Social History    Socioeconomic History  . Marital status: Married    Spouse name: Not on file  . Number of children: 1  . Years of education: Not on file  . Highest education level: Not on file  Occupational History  . Occupation: office clerk/ retired    Fish farm manager: [employer]  Tobacco Use  . Smoking status: Former Smoker    Packs/day: 1.00    Years: 20.00    Pack years: 20.00    Types: Cigarettes    Quit date: 03/13/1981    Years since quitting: 39.1  . Smokeless tobacco: Never Used  Vaping Use  . Vaping Use: Never used  Substance and Sexual Activity  . Alcohol use: No  . Drug use: No  . Sexual activity: Not Currently  Other Topics Concern  . Not on file  Social History Narrative   No exercise      Retiring in June 2017   Social Determinants of Health   Financial Resource Strain: Low Risk   . Difficulty of Paying Living Expenses: Not hard at all  Food Insecurity: No Food Insecurity  . Worried About Charity fundraiser in the Last Year: Never true  . Ran Out of Food in the Last Year: Never true  Transportation Needs: No Transportation Needs  . Lack of Transportation (Medical): No  . Lack of Transportation (Non-Medical): No  Physical Activity: Inactive  . Days of Exercise per Week: 0 days  . Minutes of Exercise per Session: 0 min  Stress: No Stress Concern Present  . Feeling of Stress : Not at all  Social Connections: Socially Integrated  . Frequency of Communication with Friends and Family: More than three times a week  . Frequency of Social Gatherings with Friends and Family: Once a week  . Attends Religious Services: More than 4 times per year  . Active Member of Clubs or Organizations: Yes  . Attends Archivist Meetings: More than 4 times per year  . Marital Status: Married    Family History  Problem Relation Age of Onset  . Diabetes Father   . Hypertension Father   . Heart disease Father   . Hypertension Mother   . Heart disease Mother   . Colon cancer Neg  Hx   . Esophageal cancer Neg Hx   . Rectal cancer Neg Hx   . Stomach cancer Neg Hx   . Colon polyps Neg Hx     Review of Systems  Constitutional: Negative for fever.  Respiratory: Positive for cough (intermittent) and shortness of breath (with exertion). Negative for wheezing.   Cardiovascular: Negative for chest pain and palpitations.  Neurological: Negative for light-headedness and headaches.  Objective:   Vitals:   05/05/20 1300  BP: 132/68  Pulse: 83  Temp: 98.8 F (37.1 C)  SpO2: 93%   BP Readings from Last 3 Encounters:  05/05/20 132/68  04/06/20 125/62  11/27/19 136/72   Wt Readings from Last 3 Encounters:  05/05/20 231 lb 9.6 oz (105.1 kg)  04/06/20 232 lb 3.2 oz (105.3 kg)  04/05/20 230 lb (104.3 kg)   Body mass index is 38.54 kg/m.   Physical Exam    Constitutional: Appears well-developed and well-nourished. No distress.  HENT:  Head: Normocephalic and atraumatic.  Neck: Neck supple. No tracheal deviation present. No thyromegaly present.  No cervical lymphadenopathy Cardiovascular: Normal rate, regular rhythm and normal heart sounds.   No murmur heard. No carotid bruit .  No edema Pulmonary/Chest: Effort normal and breath sounds normal. No respiratory distress. No has no wheezes. No rales.  Skin: Skin is warm and dry. Not diaphoretic.  Psychiatric: Normal mood and affect. Behavior is normal.      Assessment & Plan:    See Problem List for Assessment and Plan of chronic medical problems.    This visit occurred during the SARS-CoV-2 public health emergency.  Safety protocols were in place, including screening questions prior to the visit, additional usage of staff PPE, and extensive cleaning of exam room while observing appropriate contact time as indicated for disinfecting solutions.

## 2020-05-05 ENCOUNTER — Encounter: Payer: Self-pay | Admitting: Internal Medicine

## 2020-05-05 ENCOUNTER — Ambulatory Visit: Payer: Medicare PPO | Admitting: Internal Medicine

## 2020-05-05 ENCOUNTER — Other Ambulatory Visit: Payer: Self-pay

## 2020-05-05 VITALS — BP 132/68 | HR 83 | Temp 98.8°F | Wt 231.6 lb

## 2020-05-05 DIAGNOSIS — R7303 Prediabetes: Secondary | ICD-10-CM

## 2020-05-05 DIAGNOSIS — F32A Depression, unspecified: Secondary | ICD-10-CM | POA: Diagnosis not present

## 2020-05-05 DIAGNOSIS — F419 Anxiety disorder, unspecified: Secondary | ICD-10-CM

## 2020-05-05 DIAGNOSIS — N3281 Overactive bladder: Secondary | ICD-10-CM

## 2020-05-05 DIAGNOSIS — E782 Mixed hyperlipidemia: Secondary | ICD-10-CM | POA: Diagnosis not present

## 2020-05-05 DIAGNOSIS — I10 Essential (primary) hypertension: Secondary | ICD-10-CM | POA: Diagnosis not present

## 2020-05-05 DIAGNOSIS — M7918 Myalgia, other site: Secondary | ICD-10-CM

## 2020-05-05 MED ORDER — FLUTICASONE PROPIONATE 50 MCG/ACT NA SUSP: NASAL | 3 refills | Status: DC

## 2020-05-05 NOTE — Assessment & Plan Note (Signed)
Acute No injury Pain in R buttock  ? Bone pain vs gluteal muscle pain Will refer to sports med for further eval/treatment

## 2020-05-05 NOTE — Assessment & Plan Note (Signed)
Chronic Check lipid panel  Continue crestor 20 mg daily Regular exercise and healthy diet encouraged

## 2020-05-05 NOTE — Assessment & Plan Note (Signed)
Chronic Encouraged weight loss Low sugar / carb diet Stressed regular exercise

## 2020-05-05 NOTE — Assessment & Plan Note (Signed)
Chronic BMI > 35 - 38.54 with prediabetes, htn, hyperlipidemia Stressed regular exercise Healthy diet, decreased portions

## 2020-05-05 NOTE — Assessment & Plan Note (Signed)
Chronic Controlled, stable Continue myrbetriq 50 mg qd

## 2020-05-05 NOTE — Assessment & Plan Note (Addendum)
Chronic Controlled, stable Continue lexapro 20 mg daily, xanax 0.25 mg BID prn

## 2020-05-05 NOTE — Assessment & Plan Note (Signed)
Chronic BP well controlled Continue hctz 25 mg qd

## 2020-05-07 NOTE — Progress Notes (Signed)
   Subjective:   I, Kayla Maddox, LAT, ATC acting as a scribe for Kayla Leader, MD.  I'm seeing this patient as a consultation for Dr. Billey Gosling. Note will be routed back to referring provider/PCP.  CC: Right buttock pain  HPI: Pt is a 75 y/o female c/o R buttock pain ongoing for 4 weeks w/ no known MOI. Pt locates pain to deep within R buttock. Pt describes pain as a dull constant pain.  Low back pain:no Radiates: no LE Numbness/tingling: no LE Weakness: no Aggravates: sitting, transitioning from sit-to-stand, walking Treatments tried: IBU  Past medical history, Surgical history, Family history, Social history, Allergies, and medications have been entered into the medical record, reviewed.   Review of Systems: No new headache, visual changes, nausea, vomiting, diarrhea, constipation, dizziness, abdominal pain, skin rash, fevers, chills, night sweats, weight loss, swollen lymph nodes, body aches, joint swelling, muscle aches, chest pain, shortness of breath, mood changes, visual or auditory hallucinations.   Objective:    Vitals:   05/10/20 1245  BP: 128/88  Pulse: 73  SpO2: 95%   General: Well Developed, well nourished, and in no acute distress.  Neuro/Psych: Alert and oriented x3, extra-ocular muscles intact, able to move all 4 extremities, sensation grossly intact. Skin: Warm and dry, no rashes noted.  Respiratory: Not using accessory muscles, speaking in full sentences, trachea midline.  Cardiovascular: Pulses palpable, no extremity edema. Abdomen: Does not appear distended. MSK: Right hip normal-appearing Mild tender palpation greater trochanter and mid buttocks. Normal hip motion. Hip abduction strength diminished 4/5 with some pain.  External rotation strength intact without pain. Internal rotation and adduction strength intact without pain. Mild pain with gait.   Lab and Radiology Results  X-ray images right hip obtained today personally and independently  interpreted. Mild DJD.  No fractures or significant malalignment. Await formal radiology review  Impression and Recommendations:    Assessment and Plan: 75 y.o. female with right lateral and posterior pain right hip.  Thought to be related to hip abductor tendinopathy.  Plan for physical therapy and recheck back in about 6 weeks.  Precautions reviewed. Patient lives in [place] so refer to physical therapy in that region.  Return sooner if needed.Marland Kitchen  PDMP not reviewed this encounter. Orders Placed This Encounter  Procedures  . DG HIP UNILAT W OR W/O PELVIS 2-3 VIEWS RIGHT    Standing Status:   Future    Number of Occurrences:   1    Standing Expiration Date:   05/10/2021    Order Specific Question:   Reason for Exam (SYMPTOM  OR DIAGNOSIS REQUIRED)    Answer:   chronic right pelvis pain    Order Specific Question:   Preferred imaging location?    Answer:   [facility]  . Ambulatory referral to Physical Therapy    Referral Priority:   Routine    Referral Type:   Physical Medicine    Referral Reason:   Specialty Services Required    Requested Specialty:   Physical Therapy    Number of Visits Requested:   1   No orders of the defined types were placed in this encounter.   Discussed warning signs or symptoms. Please see discharge instructions. Patient expresses understanding.   The above documentation has been reviewed and is accurate and complete Kayla Maddox, M.D.

## 2020-05-10 ENCOUNTER — Ambulatory Visit: Payer: Medicare PPO | Admitting: Family Medicine

## 2020-05-10 ENCOUNTER — Encounter: Payer: Self-pay | Admitting: Family Medicine

## 2020-05-10 ENCOUNTER — Ambulatory Visit: Payer: Medicare PPO

## 2020-05-10 ENCOUNTER — Ambulatory Visit (INDEPENDENT_AMBULATORY_CARE_PROVIDER_SITE_OTHER)
Admission: RE | Admit: 2020-05-10 | Discharge: 2020-05-10 | Disposition: A | Discharge status: Home / Self Care | Payer: Medicare PPO | Source: Ambulatory Visit | Attending: Family Medicine | Admitting: Family Medicine

## 2020-05-10 ENCOUNTER — Other Ambulatory Visit: Payer: Self-pay

## 2020-05-10 VITALS — BP 128/88 | HR 73 | Ht 65.0 in | Wt 229.4 lb

## 2020-05-10 DIAGNOSIS — M1611 Unilateral primary osteoarthritis, right hip: Secondary | ICD-10-CM | POA: Diagnosis not present

## 2020-05-10 DIAGNOSIS — M533 Sacrococcygeal disorders, not elsewhere classified: Secondary | ICD-10-CM | POA: Diagnosis not present

## 2020-05-10 DIAGNOSIS — M76891 Other specified enthesopathies of right lower limb, excluding foot: Secondary | ICD-10-CM | POA: Diagnosis not present

## 2020-05-10 DIAGNOSIS — M25551 Pain in right hip: Secondary | ICD-10-CM

## 2020-05-10 DIAGNOSIS — M25751 Osteophyte, right hip: Secondary | ICD-10-CM | POA: Diagnosis not present

## 2020-05-10 NOTE — Patient Instructions (Signed)
Thank you for coming in today.  I've referred you to Physical Therapy.  Let us know if you don't hear from them in one week.  Please get an Xray today before you leave  Let me know if you have a problem.   Recheck with me in about 6 weeks.Marland Kitchen

## 2020-05-11 NOTE — Progress Notes (Signed)
X-ray right hip shows mild arthritis of the right hip

## 2020-05-21 ENCOUNTER — Telehealth: Payer: Self-pay | Admitting: *Deleted

## 2020-05-21 NOTE — Telephone Encounter (Signed)
-----   Message from Jerene Bears, MD sent at 05/21/2020 10:29 AM EST ----- See note from Dr. Debara Pickett. Ok to proceed with endoscopic procedures Thanks JMP  ----- Message ----- From: Pixie Casino, MD Sent: 05/21/2020   8:15 AM EST To: Jerene Bears, MD  Ok to proceed with colonoscopy you had planned- [place]

## 2020-05-21 NOTE — Telephone Encounter (Signed)
I have contacted patient to advise that cardiology has okayed her to go forward with colonoscopy procedure. She has been scheduled for colonoscopy and virtual previsit. Patient verbalizes understanding and is agreeable to plan.

## 2020-05-31 ENCOUNTER — Ambulatory Visit: Payer: Medicare PPO | Admitting: Internal Medicine

## 2020-05-31 DIAGNOSIS — M76891 Other specified enthesopathies of right lower limb, excluding foot: Secondary | ICD-10-CM | POA: Diagnosis not present

## 2020-05-31 DIAGNOSIS — M25551 Pain in right hip: Secondary | ICD-10-CM | POA: Diagnosis not present

## 2020-06-02 DIAGNOSIS — M76891 Other specified enthesopathies of right lower limb, excluding foot: Secondary | ICD-10-CM | POA: Diagnosis not present

## 2020-06-02 DIAGNOSIS — M25551 Pain in right hip: Secondary | ICD-10-CM | POA: Diagnosis not present

## 2020-06-07 DIAGNOSIS — M25551 Pain in right hip: Secondary | ICD-10-CM | POA: Diagnosis not present

## 2020-06-07 DIAGNOSIS — M76891 Other specified enthesopathies of right lower limb, excluding foot: Secondary | ICD-10-CM | POA: Diagnosis not present

## 2020-06-11 DIAGNOSIS — M76891 Other specified enthesopathies of right lower limb, excluding foot: Secondary | ICD-10-CM | POA: Diagnosis not present

## 2020-06-11 DIAGNOSIS — M25551 Pain in right hip: Secondary | ICD-10-CM | POA: Diagnosis not present

## 2020-06-16 DIAGNOSIS — M25551 Pain in right hip: Secondary | ICD-10-CM | POA: Diagnosis not present

## 2020-06-16 DIAGNOSIS — M76891 Other specified enthesopathies of right lower limb, excluding foot: Secondary | ICD-10-CM | POA: Diagnosis not present

## 2020-06-17 ENCOUNTER — Ambulatory Visit: Payer: Medicare PPO | Admitting: Internal Medicine

## 2020-06-18 DIAGNOSIS — M76891 Other specified enthesopathies of right lower limb, excluding foot: Secondary | ICD-10-CM | POA: Diagnosis not present

## 2020-06-18 DIAGNOSIS — M25551 Pain in right hip: Secondary | ICD-10-CM | POA: Diagnosis not present

## 2020-06-21 ENCOUNTER — Ambulatory Visit: Payer: Medicare PPO | Admitting: Family Medicine

## 2020-06-21 ENCOUNTER — Ambulatory Visit (AMBULATORY_SURGERY_CENTER): Payer: Self-pay | Admitting: *Deleted

## 2020-06-21 ENCOUNTER — Other Ambulatory Visit: Payer: Self-pay

## 2020-06-21 VITALS — Ht 65.0 in | Wt 230.0 lb

## 2020-06-21 DIAGNOSIS — K50119 Crohn's disease of large intestine with unspecified complications: Secondary | ICD-10-CM

## 2020-06-21 DIAGNOSIS — Z86010 Personal history of colonic polyps: Secondary | ICD-10-CM

## 2020-06-21 MED ORDER — NA SULFATE-K SULFATE-MG SULF 17.5-3.13-1.6 GM/177ML PO SOLN: ORAL | 0 refills | Status: DC

## 2020-06-21 NOTE — Progress Notes (Signed)
Patient's pre-visit was done today over the phone with the patient due to COVID-19 pandemic. Name,DOB and address verified. Insurance verified. Patient denies any allergies to Eggs and Soy. Patient denies any problems with anesthesia/sedation. Patient denies taking diet pills or blood thinners. Packet of Prep instructions mailed to patient including a copy of a consent form-pt is aware. Patient understands to call us back with any questions or concerns. The patient is COVID-19 fully vaccinated, per patient. Patient is aware of our care-partner policy and TJQZE-[id number] safety protocol. EMMI education assigned to the patient for the procedure, sent to [facility]. Patient denies any medical hx changes since last GI PV.

## 2020-06-22 DIAGNOSIS — M76891 Other specified enthesopathies of right lower limb, excluding foot: Secondary | ICD-10-CM | POA: Diagnosis not present

## 2020-06-22 DIAGNOSIS — M25551 Pain in right hip: Secondary | ICD-10-CM | POA: Diagnosis not present

## 2020-06-23 ENCOUNTER — Telehealth: Payer: Self-pay | Admitting: Internal Medicine

## 2020-06-23 ENCOUNTER — Other Ambulatory Visit: Payer: Self-pay | Admitting: Internal Medicine

## 2020-06-23 DIAGNOSIS — R918 Other nonspecific abnormal finding of lung field: Secondary | ICD-10-CM

## 2020-06-23 DIAGNOSIS — R911 Solitary pulmonary nodule: Secondary | ICD-10-CM

## 2020-06-23 NOTE — Telephone Encounter (Signed)
Spoke with patient regarding Wednesday 07/07/20 12:00pm CT chest appointment at [facility], [address]---arrival time is 11:30 am for check in.  Patient given phone number for rescheduling if necessary [phone].  Patient voiced her understanding.

## 2020-06-24 DIAGNOSIS — M25551 Pain in right hip: Secondary | ICD-10-CM | POA: Diagnosis not present

## 2020-06-24 DIAGNOSIS — M76891 Other specified enthesopathies of right lower limb, excluding foot: Secondary | ICD-10-CM | POA: Diagnosis not present

## 2020-06-29 DIAGNOSIS — M25551 Pain in right hip: Secondary | ICD-10-CM | POA: Diagnosis not present

## 2020-06-29 DIAGNOSIS — M76891 Other specified enthesopathies of right lower limb, excluding foot: Secondary | ICD-10-CM | POA: Diagnosis not present

## 2020-07-01 DIAGNOSIS — M25551 Pain in right hip: Secondary | ICD-10-CM | POA: Diagnosis not present

## 2020-07-01 DIAGNOSIS — M76891 Other specified enthesopathies of right lower limb, excluding foot: Secondary | ICD-10-CM | POA: Diagnosis not present

## 2020-07-03 ENCOUNTER — Other Ambulatory Visit: Payer: Self-pay | Admitting: Internal Medicine

## 2020-07-05 DIAGNOSIS — M76891 Other specified enthesopathies of right lower limb, excluding foot: Secondary | ICD-10-CM | POA: Diagnosis not present

## 2020-07-05 DIAGNOSIS — M25551 Pain in right hip: Secondary | ICD-10-CM | POA: Diagnosis not present

## 2020-07-07 ENCOUNTER — Ambulatory Visit: Payer: Medicare PPO

## 2020-07-08 ENCOUNTER — Ambulatory Visit (AMBULATORY_SURGERY_CENTER): Payer: Medicare PPO | Admitting: Internal Medicine

## 2020-07-08 ENCOUNTER — Other Ambulatory Visit: Payer: Self-pay

## 2020-07-08 ENCOUNTER — Encounter: Payer: Medicare PPO | Admitting: Internal Medicine

## 2020-07-08 ENCOUNTER — Encounter: Payer: Self-pay | Admitting: Internal Medicine

## 2020-07-08 VITALS — BP 110/55 | HR 70 | Temp 97.1°F | Resp 16 | Ht 65.0 in | Wt 230.0 lb

## 2020-07-08 DIAGNOSIS — D122 Benign neoplasm of ascending colon: Secondary | ICD-10-CM | POA: Diagnosis not present

## 2020-07-08 DIAGNOSIS — D125 Benign neoplasm of sigmoid colon: Secondary | ICD-10-CM | POA: Diagnosis not present

## 2020-07-08 DIAGNOSIS — Z86010 Personal history of colonic polyps: Secondary | ICD-10-CM | POA: Diagnosis not present

## 2020-07-08 DIAGNOSIS — D123 Benign neoplasm of transverse colon: Secondary | ICD-10-CM | POA: Diagnosis not present

## 2020-07-08 DIAGNOSIS — K5090 Crohn's disease, unspecified, without complications: Secondary | ICD-10-CM | POA: Diagnosis not present

## 2020-07-08 DIAGNOSIS — K635 Polyp of colon: Secondary | ICD-10-CM | POA: Diagnosis not present

## 2020-07-08 MED ORDER — MESALAMINE ER 0.375 G PO CP24: 1500.0000 mg | ORAL_CAPSULE | Freq: Every day | ORAL | 2 refills | Status: DC

## 2020-07-08 MED ORDER — MESALAMINE ER 0.375 G PO CP24: 375.0000 mg | ORAL_CAPSULE | Freq: Every day | ORAL | 11 refills | Status: DC

## 2020-07-08 MED ORDER — SODIUM CHLORIDE 0.9 % IV SOLN: 500.0000 mL | Freq: Once | INTRAVENOUS | Status: DC

## 2020-07-08 NOTE — Progress Notes (Signed)
Called to room to assist during endoscopic procedure.  Patient ID and intended procedure confirmed with present staff. Received instructions for my participation in the procedure from the performing physician.  

## 2020-07-08 NOTE — Progress Notes (Signed)
Pt's states no medical or surgical changes since previsit or office visit. 

## 2020-07-08 NOTE — Progress Notes (Signed)
Pt Drowsy. VSS. To PACU, report to RN. No anesthetic complications noted.  

## 2020-07-08 NOTE — Patient Instructions (Signed)
Handouts given:  Diverticulosis, Hemorrhoids, polyps Resume previous diet Continue current medications Await pathology results  YOU HAD AN ENDOSCOPIC PROCEDURE TODAY AT [facility]:   Refer to the procedure report that was given to you for any specific questions about what was found during the examination.  If the procedure report does not answer your questions, please call your gastroenterologist to clarify.  If you requested that your care partner not be given the details of your procedure findings, then the procedure report has been included in a sealed envelope for you to review at your convenience later.  YOU SHOULD EXPECT: Some feelings of bloating in the abdomen. Passage of more gas than usual.  Walking can help get rid of the air that was put into your GI tract during the procedure and reduce the bloating. If you had a lower endoscopy (such as a colonoscopy or flexible sigmoidoscopy) you may notice spotting of blood in your stool or on the toilet paper. If you underwent a bowel prep for your procedure, you may not have a normal bowel movement for a few days.  Please Note:  You might notice some irritation and congestion in your nose or some drainage.  This is from the oxygen used during your procedure.  There is no need for concern and it should clear up in a day or so.  SYMPTOMS TO REPORT IMMEDIATELY:   Following lower endoscopy (colonoscopy or flexible sigmoidoscopy):  Excessive amounts of blood in the stool  Significant tenderness or worsening of abdominal pains  Swelling of the abdomen that is new, acute  Fever of 100F or higher For urgent or emergent issues, a gastroenterologist can be reached at any hour by calling [phone]. Do not use MyChart messaging for urgent concerns.   DIET:  We do recommend a small meal at first, but then you may proceed to your regular diet.  Drink plenty of fluids but you should avoid alcoholic beverages for 24 hours.  ACTIVITY:   You should plan to take it easy for the rest of today and you should NOT DRIVE or use heavy machinery until tomorrow (because of the sedation medicines used during the test).    FOLLOW UP: Our staff will call the number listed on your records 48-72 hours following your procedure to check on you and address any questions or concerns that you may have regarding the information given to you following your procedure. If we do not reach you, we will leave a message.  We will attempt to reach you two times.  During this call, we will ask if you have developed any symptoms of COVID 19. If you develop any symptoms (ie: fever, flu-like symptoms, shortness of breath, cough etc.) before then, please call [phone].  If you test positive for Covid 19 in the 2 weeks post procedure, please call and report this information to [place].    If any biopsies were taken you will be contacted by phone or by letter within the next 1-3 weeks.  Please call us at [phone] if you have not heard about the biopsies in 3 weeks.   SIGNATURES/CONFIDENTIALITY: You and/or your care partner have signed paperwork which will be entered into your electronic medical record.  These signatures attest to the fact that that the information above on your After Visit Summary has been reviewed and is understood.  Full responsibility of the confidentiality of this discharge information lies with you and/or your care-partner.

## 2020-07-08 NOTE — Progress Notes (Signed)
N.S. vital signs.

## 2020-07-08 NOTE — Op Note (Signed)
[facility] Patient Name: Kayla Maddox Procedure Date: 07/08/2020 2:51 PM MRN: [id number] Endoscopist: Jerene Bears , MD Age: 75 Referring MD:  Date of Birth: 1945-07-30 Gender: Female Account #: [id number] Procedure:                Colonoscopy Indications:              High risk colon cancer surveillance: Personal                            history of sessile serrated colon polyps x4 (less                            than 10 mm in size) with no dysplasia at last                            colonoscopy: June 2018, history of Incidental -                            Crohn's disease of the colon (dating back to 2005                            felt to be infectious at that time, but seen again                            in 2015 and biopsies consistent with Crohn's) Medicines:                Monitored Anesthesia Care Procedure:                Pre-Anesthesia Assessment:                           - Prior to the procedure, a History and Physical                            was performed, and patient medications and                            allergies were reviewed. The patient's tolerance of                            previous anesthesia was also reviewed. The risks                            and benefits of the procedure and the sedation                            options and risks were discussed with the patient.                            All questions were answered, and informed consent                            was obtained. Prior Anticoagulants: The patient has  taken no previous anticoagulant or antiplatelet                            agents. ASA Grade Assessment: II - A patient with                            mild systemic disease. After reviewing the risks                            and benefits, the patient was deemed in                            satisfactory condition to undergo the procedure.                           After obtaining informed  consent, the colonoscope                            was passed under direct vision. Throughout the                            procedure, the patient's blood pressure, pulse, and                            oxygen saturations were monitored continuously. The                            Olympus PFC-[id number] (#[id number]) Colonoscope was                            introduced through the anus and advanced to the                            cecum, identified by appendiceal orifice and                            ileocecal valve. The colonoscopy was performed                            without difficulty. The patient tolerated the                            procedure well. The quality of the bowel                            preparation was good (only after copious irrigation                            and lavage). The ileocecal valve, appendiceal                            orifice, and rectum were photographed. Scope In: 3:04:50 PM Scope Out: 3:35:45 PM Scope Withdrawal Time: 0 hours 20 minutes 31 seconds  Total Procedure Duration: 0 hours 23  minutes 28 seconds  Findings:                 The digital rectal exam was normal.                           Three sessile polyps were found in the ascending                            colon. The polyps were 4 to 7 mm in size. These                            polyps were removed with a cold snare. Resection                            and retrieval were complete.                           A 4 mm polyp was found in the transverse colon. The                            polyp was sessile. The polyp was removed with a                            cold snare. Resection and retrieval were complete.                           A 6 mm polyp was found in the sigmoid colon. The                            polyp was sessile. The polyp was removed with a                            cold snare. Resection and retrieval were complete.                           Multiple small and  large-mouthed diverticula were                            found in the sigmoid colon and descending colon.                           Internal hemorrhoids were found during                            retroflexion. The hemorrhoids were small.                           The exam was otherwise without abnormality. No                            evidence of active colitis. Complications:            No immediate complications. Estimated Blood Loss:     Estimated blood loss was  minimal. Impression:               - Three 4 to 7 mm polyps in the ascending colon,                            removed with a cold snare. Resected and retrieved.                           - One 4 mm polyp in the transverse colon, removed                            with a cold snare. Resected and retrieved.                           - One 6 mm polyp in the sigmoid colon, removed with                            a cold snare. Resected and retrieved.                           - Severe diverticulosis in the sigmoid colon and in                            the descending colon.                           - Small internal hemorrhoids.                           - The examination was otherwise normal. No evidence                            of active colitis. Recommendation:           - Patient has a contact number available for                            emergencies. The signs and symptoms of potential                            delayed complications were discussed with the                            patient. Return to normal activities tomorrow.                            Written discharge instructions were provided to the                            patient.                           - Resume previous diet.                           - Continue  present medications.                           - Await pathology results.                           - Repeat colonoscopy is recommended for                            surveillance. The  colonoscopy date will be                            determined after pathology results from today's                            exam become available for review. Jerene Bears, MD 07/08/2020 3:36:05 PM This report has been signed electronically.

## 2020-07-12 ENCOUNTER — Telehealth: Payer: Self-pay

## 2020-07-12 DIAGNOSIS — M25551 Pain in right hip: Secondary | ICD-10-CM | POA: Diagnosis not present

## 2020-07-12 DIAGNOSIS — M76891 Other specified enthesopathies of right lower limb, excluding foot: Secondary | ICD-10-CM | POA: Diagnosis not present

## 2020-07-12 NOTE — Telephone Encounter (Signed)
  Follow up Call-  Call back number 07/08/2020  Post procedure Call Back phone  # [phone]  Permission to leave phone message Yes  Some recent data might be hidden     Patient questions:  Do you have a fever, pain , or abdominal swelling? No. Pain Score  0 *  Have you tolerated food without any problems? Yes.    Have you been able to return to your normal activities? Yes.    Do you have any questions about your discharge instructions: Diet   No. Medications  No. Follow up visit  No.  Do you have questions or concerns about your Care? No.  Actions: * If pain score is 4 or above: No action needed, pain <4.  1. Have you developed a fever since your procedure? no  2.   Have you had an respiratory symptoms (SOB or cough) since your procedure? no  3.   Have you tested positive for COVID 19 since your procedure no  4.   Have you had any family members/close contacts diagnosed with the COVID 19 since your procedure?  no   If yes to any of these questions please route to Joylene John, RN and Joella Prince, RN

## 2020-07-12 NOTE — Progress Notes (Signed)
   I, Wendy Poet, LAT, ATC, am serving as scribe for Dr. Lynne Leader.  Kayla Maddox is a 75 y.o. female who presents to [place] [facility] today for f/u R hip/buttock pain.  Pt was last seen by Dr. Georgina Snell on 05/10/20 and was referred to PT of which she's completed 10 visits. Today, pt reports that she's feeling better but notes that her pain is not completely resolved.  She notes that her pain is intermittent in nature and depends on what surface she sits on.  She has been doing her HEP as per PT.  Dx imaging: 05/10/20 R hip XR  Pertinent review of systems: No fevers or chills  Relevant historical information: Hypertension   Exam:  BP 138/72 (BP Location: Right Arm, Patient Position: Sitting, Cuff Size: Normal)   Pulse 80   Ht 5' 5"  (1.651 m)   Wt 232 lb 9.6 oz (105.5 kg)   SpO2 95%   BMI 38.71 kg/m  General: Well Developed, well nourished, and in no acute distress.   MSK: Right hip normal-appearing nontender normal motion hip abduction strength diminished 4/5.    Lab and Radiology Results  EXAM: DG HIP (WITH OR WITHOUT PELVIS) 2-3V RIGHT  COMPARISON:  None.  FINDINGS: AP view of the pelvis obtained standing with standing AP and lateral views of the right hip. Hip joint space is preserved. There is minimal lateral acetabular spurring. Femoral head is well seated. No evidence of fracture, focal lesion, avascular necrosis or bony destruction. Pubic rami are intact. Pubic symphysis and sacroiliac joints are congruent. Detailed assessment limited by soft tissue attenuation from habitus.  IMPRESSION: Minimal degenerative lateral acetabular spurring of the right hip.   Electronically Signed   By: Keith Rake M.D.   On: 05/11/2020 10:40  I, Lynne Leader, personally (independently) visualized and performed the interpretation of the images attached in this note.     Assessment and Plan: 75 y.o. female with right lateral hip pain due to  hip abductor tendinopathy and trochanteric bursitis.  Patient had significant reduction in pain with physical therapy.  She still has some discomfort.  Discussed some pragmatic solutions including offloading pressure while driving.  Additionally she still some weakness and could benefit from ongoing home exercise program working on strengthening.  Recheck back with me as needed.     Discussed warning signs or symptoms. Please see discharge instructions. Patient expresses understanding.   The above documentation has been reviewed and is accurate and complete Lynne Leader, M.D.

## 2020-07-13 ENCOUNTER — Ambulatory Visit: Payer: Medicare PPO | Admitting: Family Medicine

## 2020-07-13 ENCOUNTER — Other Ambulatory Visit: Payer: Self-pay

## 2020-07-13 ENCOUNTER — Encounter: Payer: Self-pay | Admitting: Family Medicine

## 2020-07-13 VITALS — BP 138/72 | HR 80 | Ht 65.0 in | Wt 232.6 lb

## 2020-07-13 DIAGNOSIS — M76891 Other specified enthesopathies of right lower limb, excluding foot: Secondary | ICD-10-CM

## 2020-07-13 DIAGNOSIS — M25551 Pain in right hip: Secondary | ICD-10-CM

## 2020-07-13 NOTE — Patient Instructions (Signed)
Thank you for coming in today.  Plan to transition to home exercises.   I can order more PT in the future if needed.   Keep me updated as needed.

## 2020-07-15 DIAGNOSIS — M76891 Other specified enthesopathies of right lower limb, excluding foot: Secondary | ICD-10-CM | POA: Diagnosis not present

## 2020-07-15 DIAGNOSIS — M25551 Pain in right hip: Secondary | ICD-10-CM | POA: Diagnosis not present

## 2020-07-16 ENCOUNTER — Inpatient Hospital Stay: Admission: RE | Admit: 2020-07-16 | Payer: Medicare PPO | Source: Ambulatory Visit

## 2020-07-16 ENCOUNTER — Encounter: Payer: Self-pay | Admitting: Internal Medicine

## 2020-07-16 ENCOUNTER — Telehealth: Payer: Self-pay | Admitting: Internal Medicine

## 2020-07-16 NOTE — Telephone Encounter (Signed)
Santiago Glad is calling with surgical auth for this pt.

## 2020-07-16 NOTE — Telephone Encounter (Signed)
Received direct call in to triage from Santiago Glad and [facility] who states that patient is having Chest CT today at 12pm and needs authorization through [employer]. Per karen her supervisor has submitted an auth but would like to see if our pre cert team and get it done with in the next couple of hours so that patient does not have to reschedule. Raynaldo Opitz that I would forward message as urgent.   Karens call back number is [phone].

## 2020-07-16 NOTE — Telephone Encounter (Signed)
Abdul-Razzaaq, Hilary Hertz, Belinda Block, RN; [place], [place]; [place], [place] Cc: Alta Cor[place] Good [place],   The pt's Josem Kaufmann that was submitted yesterday by Shaune Pascal could've been approved today, but clinical info was not given when it went to clinical review. I was informed by [facility] that because I did not submit the auth, I could not withdraw it. I've faxed that information, but the appointment will need to be rescheduled.   Thank you,   Anisah    Per staff message above- returned call to Santiago Glad at [facility]. Made karen aware and karen verbalized understanding. Per karen- she will reach out and get patient rescheduled for scan.   Will forward to Dr. Lysbeth Penner nurse to make aware.

## 2020-07-22 ENCOUNTER — Other Ambulatory Visit: Payer: Self-pay | Admitting: Internal Medicine

## 2020-07-27 ENCOUNTER — Ambulatory Visit
Admission: RE | Admit: 2020-07-27 | Discharge: 2020-07-27 | Disposition: A | Discharge status: Home / Self Care | Payer: Medicare PPO | Source: Ambulatory Visit | Attending: Internal Medicine | Admitting: Internal Medicine

## 2020-07-27 DIAGNOSIS — R918 Other nonspecific abnormal finding of lung field: Secondary | ICD-10-CM

## 2020-07-27 DIAGNOSIS — R911 Solitary pulmonary nodule: Secondary | ICD-10-CM

## 2020-08-02 ENCOUNTER — Other Ambulatory Visit: Payer: Self-pay | Admitting: *Deleted

## 2020-08-02 DIAGNOSIS — R911 Solitary pulmonary nodule: Secondary | ICD-10-CM

## 2020-08-02 DIAGNOSIS — R918 Other nonspecific abnormal finding of lung field: Secondary | ICD-10-CM

## 2020-08-06 ENCOUNTER — Telehealth: Payer: Self-pay | Admitting: *Deleted

## 2020-08-06 NOTE — Telephone Encounter (Signed)
A message was left, re: her referral to Dr.Byrum's office on June 16 th. Appointment mailed.

## 2020-08-23 DIAGNOSIS — Z08 Encounter for follow-up examination after completed treatment for malignant neoplasm: Secondary | ICD-10-CM | POA: Diagnosis not present

## 2020-08-23 DIAGNOSIS — Z7189 Other specified counseling: Secondary | ICD-10-CM | POA: Diagnosis not present

## 2020-08-23 DIAGNOSIS — L918 Other hypertrophic disorders of the skin: Secondary | ICD-10-CM | POA: Diagnosis not present

## 2020-08-23 DIAGNOSIS — Z85828 Personal history of other malignant neoplasm of skin: Secondary | ICD-10-CM | POA: Diagnosis not present

## 2020-08-23 DIAGNOSIS — D1801 Hemangioma of skin and subcutaneous tissue: Secondary | ICD-10-CM | POA: Diagnosis not present

## 2020-08-23 DIAGNOSIS — L820 Inflamed seborrheic keratosis: Secondary | ICD-10-CM | POA: Diagnosis not present

## 2020-08-23 DIAGNOSIS — L814 Other melanin hyperpigmentation: Secondary | ICD-10-CM | POA: Diagnosis not present

## 2020-08-23 DIAGNOSIS — L821 Other seborrheic keratosis: Secondary | ICD-10-CM | POA: Diagnosis not present

## 2020-08-26 ENCOUNTER — Other Ambulatory Visit: Payer: Self-pay

## 2020-08-26 ENCOUNTER — Encounter: Payer: Self-pay | Admitting: Emergency Medicine

## 2020-08-26 ENCOUNTER — Ambulatory Visit: Payer: Medicare PPO | Admitting: Emergency Medicine

## 2020-08-26 DIAGNOSIS — R0600 Dyspnea, unspecified: Secondary | ICD-10-CM

## 2020-08-26 DIAGNOSIS — R918 Other nonspecific abnormal finding of lung field: Secondary | ICD-10-CM

## 2020-08-26 DIAGNOSIS — G4733 Obstructive sleep apnea (adult) (pediatric): Secondary | ICD-10-CM | POA: Diagnosis not present

## 2020-08-26 DIAGNOSIS — R058 Other specified cough: Secondary | ICD-10-CM | POA: Diagnosis not present

## 2020-08-26 DIAGNOSIS — R0609 Other forms of dyspnea: Secondary | ICD-10-CM

## 2020-08-26 NOTE — Assessment & Plan Note (Signed)
We will plan to repeat your CT scan of the chest without contrast in May 2023 to compare with your priors.

## 2020-08-26 NOTE — Assessment & Plan Note (Signed)
Untreated

## 2020-08-26 NOTE — Progress Notes (Signed)
Subjective:    Patient ID: Kayla Maddox, female    DOB: July 07, 1945, 74 y.o.   MRN: [id number]  HPI 75 year old former smoker (20 pack years), with a history of hyperlipidemia, hypertension, GERD, Crohn's disease, colonic polyps and tubular adenoma colon.  She is here to evaluate an abnormal CT scan of the chest.  She is experiencing exertional SOB with housework, walking over 50 ft she has trouble. Began about 1 yr ago. She has some cough, happens every day, sometimes associated w food or drink. No obvious reflux. She does have some nasal gtt and congestion. She has flonase qd. No other antihistamine.   Cardiac CT 07/09/2019 reviewed by me, showed calcium score 31.5, peripheral right upper lobe pulmonary nodule 4 mm, small 2 to 3 mm nodule at the right major fissure.  CT chest 07/27/2020 reviewed by me shows scattered multiple small pulmonary nodules all less than 4 mm.  The nodule noted in the right upper lobe on cardiac scan is stable in appearance and size.  Possibly some mild airway plugging in the right lower lobe  TTE 04/29/20 >> TTE normal L and R heart fxn.   Pulmonary function testing 02/10/2010 reviewed by me shows normal airflows, no bronchodilator response, normal volumes, normal diffusion capacity.   Review of Systems As per HPI  Past Medical History:  Diagnosis Date   Allergic rhinitis    Allergy    Anxiety    Arthritis    Cancer ([place])    skin cancer    Cataract    bilat removed    Chronic colitis    Crohn's colitis ([place])    Diverticulosis    Fundic gland polyps of stomach, benign    Gallbladder polyp 2017   Gastric heterotopia    GERD (gastroesophageal reflux disease)    Hyperlipidemia    Hyperplastic colon polyp    Hypertension    Internal hemorrhoids    Obesity    Serrated adenoma of colon    Sleep apnea    does not use CPAP   Tubular adenoma of colon      Family History  Problem Relation Age of Onset   Diabetes Father    Hypertension Father     Heart disease Father    Hypertension Mother    Heart disease Mother    Colon cancer Neg Hx    Esophageal cancer Neg Hx    Rectal cancer Neg Hx    Stomach cancer Neg Hx    Colon polyps Neg Hx     No family hx lung CA  Social History   Socioeconomic History   Marital status: Married    Spouse name: Not on file   Number of children: 1   Years of education: Not on file   Highest education level: Not on file  Occupational History   Occupation: office clerk/ retired    Fish farm manager: [employer]  Tobacco Use   Smoking status: Former    Packs/day: 1.00    Years: 20.00    Pack years: 20.00    Types: Cigarettes    Quit date: 03/13/1981    Years since quitting: 39.4   Smokeless tobacco: Never  Vaping Use   Vaping Use: Never used  Substance and Sexual Activity   Alcohol use: No   Drug use: No   Sexual activity: Not Currently  Other Topics Concern   Not on file  Social History Narrative   No exercise      Retiring in June  May 16, 2015   Social Determinants of Health   Financial Resource Strain: Low Risk    Difficulty of Paying Living Expenses: Not hard at all  Food Insecurity: No Food Insecurity   Worried About Charity fundraiser in the Last Year: Never true   Ran Out of Food in the Last Year: Never true  Transportation Needs: No Transportation Needs   Lack of Transportation (Medical): No   Lack of Transportation (Non-Medical): No  Physical Activity: Inactive   Days of Exercise per Week: 0 days   Minutes of Exercise per Session: 0 min  Stress: No Stress Concern Present   Feeling of Stress : Not at all  Social Connections: Socially Integrated   Frequency of Communication with Friends and Family: More than three times a week   Frequency of Social Gatherings with Friends and Family: Once a week   Attends Religious Services: More than 4 times per year   Active Member of [employer] or Organizations: Yes   Attends Music therapist: More than 4 times per year   Marital Status:  Married  Human resources officer Violence: Not on file    From [place], never lived anywhere else.  Has done office work No other exposures.  Has owned dog, owned cockatiel for 65 yrs, died in 05/16/79 No pool, no hot tub No TB exposure.    Allergies  Allergen Reactions   Simvastatin     Muscle weakness   Amoxicillin Rash   Clarithromycin Rash   Doxycycline Rash   Prednisone Rash   Sulfonamide Derivatives Rash     Outpatient Medications Prior to Visit  Medication Sig Dispense Refill   ALPRAZolam (XANAX) 0.25 MG tablet Take 1 tablet (0.25 mg total) by mouth 2 (two) times daily as needed for anxiety. 60 tablet 0   Ascorbic Acid (VITAMIN C) 1000 MG tablet Take 1,000 mg by mouth daily.     benzonatate (TESSALON) 200 MG capsule Take 1 capsule (200 mg total) by mouth 3 (three) times daily as needed for cough. 90 capsule 1   Calcium Carbonate (CALCIUM 500 PO) Take by mouth.     Coenzyme Q10 (CO Q-10) 200 MG CAPS Take 200 mg by mouth.     escitalopram (LEXAPRO) 20 MG tablet TAKE ONE TABLET EVERY DAY 90 tablet 1   fluticasone (FLONASE) 50 MCG/ACT nasal spray INHALE 2 SPRAYS IN EACH NOSTRIL EVERY DAY 16 g 3   hydrochlorothiazide (HYDRODIURIL) 25 MG tablet Take 1 tablet (25 mg total) by mouth daily. 90 tablet 3   mesalamine (APRISO) 0.375 g 24 hr capsule Take 4 capsules (1.5 g total) by mouth daily. 120 capsule 2   MYRBETRIQ 50 MG TB24 tablet TAKE ONE TABLET EVERY DAY 90 tablet 1   vitamin B-12 (CYANOCOBALAMIN) 1000 MCG tablet Take 1 tablet (1,000 mcg total) by mouth daily.     mesalamine (APRISO) 0.375 g 24 hr capsule Take 1 capsule (0.375 g total) by mouth daily. 120 capsule 11   rosuvastatin (CRESTOR) 20 MG tablet Take 1 tablet (20 mg total) by mouth daily. 90 tablet 3   No facility-administered medications prior to visit.         Objective:   Physical Exam Vitals:   08/26/20 1329  BP: 128/68  Pulse: 78  Temp: 98.2 F (36.8 C)  TempSrc: Temporal  SpO2: 94%  Weight: 219 lb 3.2 oz (99.4 kg)   Height: 5' 5"  (1.651 m)   Gen: Pleasant, overwt, in no distress,  normal affect  ENT: No  lesions,  mouth clear,  oropharynx clear, no postnasal drip  Neck: No JVD, no stridor  Lungs: No use of accessory muscles, no crackles or wheezing on normal respiration, no wheeze on forced expiration  Cardiovascular: RRR, heart sounds normal, no murmur or gallops, no peripheral edema  Musculoskeletal: No deformities, no cyanosis or clubbing  Neuro: alert, awake, non focal  Skin: Warm, no lesions or rash      Assessment & Plan:   Upper airway cough syndrome We will work on cough suppression and also treatment of the potential underlying contributors to sustained cough Continue your fluticasone nasal spray, 2 sprays each nostril once daily. Try starting loratadine 10 mg once daily (generic Claritin) You may benefit from some diet modification to temporarily suppress reflux. We have included a GERD diet for you to review.  Remember to avoid throat clearing if at all possible. Try using sugar-free candy and just swallow instead of clearing.  You would probably benefit from a dedicated period of voice rest. You may need to do this over a weekend. Only write notes! Use your tessalon perles up to every 6 hours if needed to suppress your cough Follow with Dr. Lamonte Sakai next available with full pulmonary function testing on the same day  Pulmonary nodules We will plan to repeat your CT scan of the chest without contrast in May 2023 to compare with your priors.  Dyspnea on exertion Progressive dyspnea.  Reassuring recent cardiac calcium scoring.  Question deconditioning.  She has a tobacco history and we need to evaluate for possible obstructive lung disease.  We will perform PFT.  If reassuring then she may need to work on her exercise and conditioning.  Her echocardiogram from February was reassuring which would argue against secondary PAH due to her untreated OSA  OSA (obstructive sleep  apnea) Untreated   Baltazar Apo, MD, PhD 08/26/2020, 2:02 PM [facility] [phone] or if no answer before 7:00PM call [phone] For any issues after 7:00PM please call eLink [phone]

## 2020-08-26 NOTE — Assessment & Plan Note (Signed)
We will work on cough suppression and also treatment of the potential underlying contributors to sustained cough Continue your fluticasone nasal spray, 2 sprays each nostril once daily. Try starting loratadine 10 mg once daily (generic Claritin) You may benefit from some diet modification to temporarily suppress reflux. We have included a GERD diet for you to review.  Remember to avoid throat clearing if at all possible. Try using sugar-free candy and just swallow instead of clearing.  You would probably benefit from a dedicated period of voice rest. You may need to do this over a weekend. Only write notes! Use your tessalon perles up to every 6 hours if needed to suppress your cough Follow with Kayla Maddox next available with full pulmonary function testing on the same day

## 2020-08-26 NOTE — Patient Instructions (Signed)
We will plan to repeat your CT scan of the chest without contrast in May 2023 to compare with your priors. We will perform pulmonary function testing at your next office visit. We will work on cough suppression and also treatment of the potential underlying contributors to sustained cough Continue your fluticasone nasal spray, 2 sprays each nostril once daily. Try starting loratadine 10 mg once daily (generic Claritin) You may benefit from some diet modification to temporarily suppress reflux. We have included a GERD diet for you to review.  Remember to avoid throat clearing if at all possible. Try using sugar-free candy and just swallow instead of clearing.  You would probably benefit from a dedicated period of voice rest. You may need to do this over a weekend. Only write notes! Use your tessalon perles up to every 6 hours if needed to suppress your cough Follow with Dr. Lamonte Sakai next available with full pulmonary function testing on the same day.

## 2020-08-26 NOTE — Assessment & Plan Note (Signed)
Progressive dyspnea.  Reassuring recent cardiac calcium scoring.  Question deconditioning.  She has a tobacco history and we need to evaluate for possible obstructive lung disease.  We will perform PFT.  If reassuring then she may need to work on her exercise and conditioning.  Her echocardiogram from February was reassuring which would argue against secondary PAH due to her untreated OSA

## 2020-08-26 NOTE — Addendum Note (Signed)
Addended byDessie Coma on: 08/26/2020 02:08 PM   Modules accepted: Orders

## 2020-09-03 ENCOUNTER — Other Ambulatory Visit: Payer: Self-pay

## 2020-09-03 MED ORDER — ROSUVASTATIN CALCIUM 20 MG PO TABS: 20.0000 mg | ORAL_TABLET | Freq: Every day | ORAL | 3 refills | Status: DC

## 2020-09-17 MED ORDER — BENZONATATE 200 MG PO CAPS: 200.0000 mg | ORAL_CAPSULE | Freq: Three times a day (TID) | ORAL | 1 refills | Status: DC | PRN

## 2020-10-12 ENCOUNTER — Ambulatory Visit: Payer: Medicare PPO | Admitting: Emergency Medicine

## 2020-10-24 ENCOUNTER — Other Ambulatory Visit: Payer: Self-pay | Admitting: Internal Medicine

## 2020-11-02 ENCOUNTER — Encounter: Payer: Self-pay | Admitting: Internal Medicine

## 2020-11-02 NOTE — Patient Instructions (Addendum)
  Blood work was ordered.     Medications changes include :   none   Your prescription(s) have been submitted to your pharmacy. Please take as directed and contact our office if you believe you are having problem(s) with the medication(s).    Please followup in 6 months

## 2020-11-02 NOTE — Progress Notes (Signed)
Subjective:    Patient ID: Kayla Maddox, female    DOB: [birth year], 75 y.o.   MRN: [id number]  HPI The patient is here for follow up of their chronic medical problems, including htn, hyperlipidemia, anxiety, depression, prediabetes, overactive bladder  She is taking all of her medications as prescribed.    Medications and allergies reviewed with patient and updated if appropriate.  Patient Active Problem List   Diagnosis Date Noted   Pulmonary nodules 08/26/2020   Morbid obesity ([place]) 05/05/2020   Right buttock pain 05/05/2020   Fatigue 10/27/2019   Foot pain, right 10/17/2018   Neuropathy 10/17/2018   Obesity (BMI 30-39.9) 07/10/2017   Esophageal spasm 01/08/2017   Overactive bladder 01/07/2017   B12 deficiency 11/26/2016   Prediabetes 05/06/2015   Fatty liver 05/04/2015   Dizziness 06/12/2014   Flushing 04/29/2014   Back pain with sciatica 11/06/2013   Anxiety and depression 10/28/2013   Crohn's colitis ([place]) 10/21/2013   Adenomatous colon polyp 10/21/2013   Gastroesophageal reflux disease without esophagitis 10/21/2013   Hyperlipidemia 02/10/2010   Essential hypertension 02/10/2010   Asthma 02/10/2010   OSA (obstructive sleep apnea) 02/10/2010   Dyspnea on exertion 02/10/2010   Upper airway cough syndrome 02/09/2010    Current Outpatient Medications on File Prior to Visit  Medication Sig Dispense Refill   ALPRAZolam (XANAX) 0.25 MG tablet Take 1 tablet (0.25 mg total) by mouth 2 (two) times daily as needed for anxiety. 60 tablet 0   Ascorbic Acid (VITAMIN C) 1000 MG tablet Take 1,000 mg by mouth daily.     benzonatate (TESSALON) 200 MG capsule Take 1 capsule (200 mg total) by mouth 3 (three) times daily as needed for cough. 90 capsule 1   Calcium Carbonate (CALCIUM 500 PO) Take by mouth.     Coenzyme Q10 (CO Q-10) 200 MG CAPS Take 200 mg by mouth.     escitalopram (LEXAPRO) 20 MG tablet TAKE ONE TABLET EVERY DAY 90 tablet 1   fluticasone (FLONASE) 50 MCG/ACT  nasal spray INHALE 2 SPRAYS IN EACH NOSTRIL EVERY DAY 16 g 3   hydrochlorothiazide (HYDRODIURIL) 25 MG tablet Take 1 tablet (25 mg total) by mouth daily. 90 tablet 3   mesalamine (APRISO) 0.375 g 24 hr capsule TAKE 4 CAPSULES EVERY DAY 120 capsule 2   MYRBETRIQ 50 MG TB24 tablet TAKE ONE TABLET EVERY DAY 90 tablet 1   rosuvastatin (CRESTOR) 20 MG tablet Take 1 tablet (20 mg total) by mouth daily. 90 tablet 3   vitamin B-12 (CYANOCOBALAMIN) 1000 MCG tablet Take 1 tablet (1,000 mcg total) by mouth daily.     No current facility-administered medications on file prior to visit.    Past Medical History:  Diagnosis Date   Allergic rhinitis    Allergy    Anxiety    Arthritis    Cancer ([place])    skin cancer    Cataract    bilat removed    Chronic colitis    Crohn's colitis ([place])    Diverticulosis    Fundic gland polyps of stomach, benign    Gallbladder polyp 2017   Gastric heterotopia    GERD (gastroesophageal reflux disease)    Hyperlipidemia    Hyperplastic colon polyp    Hypertension    Internal hemorrhoids    Obesity    Serrated adenoma of colon    Sleep apnea    does not use CPAP   Tubular adenoma of colon     Past Surgical  History:  Procedure Laterality Date   BLADDER SUSPENSION  2011   cataract surgery Bilateral 2017   COLONOSCOPY  08/22/2016   [facility]   FOOT SURGERY Left 2005   NASAL SINUS SURGERY  2012   POLYPECTOMY     SPINE SURGERY  1994   UPPER GASTROINTESTINAL ENDOSCOPY  2014    Social History   Socioeconomic History   Marital status: Married    Spouse name: Not on file   Number of children: 1   Years of education: Not on file   Highest education level: Not on file  Occupational History   Occupation: office clerk/ retired    Fish farm manager: [employer]  Tobacco Use   Smoking status: Former    Packs/day: 1.00    Years: 20.00    Pack years: 20.00    Types: Cigarettes    Quit date: 03/13/1981    Years since quitting: 39.6   Smokeless tobacco: Never   Vaping Use   Vaping Use: Never used  Substance and Sexual Activity   Alcohol use: No   Drug use: No   Sexual activity: Not Currently  Other Topics Concern   Not on file  Social History Narrative   No exercise      Retiring in June 2017   Social Determinants of Health   Financial Resource Strain: Low Risk    Difficulty of Paying Living Expenses: Not hard at all  Food Insecurity: No Food Insecurity   Worried About Charity fundraiser in the Last Year: Never true   Arboriculturist in the Last Year: Never true  Transportation Needs: No Transportation Needs   Lack of Transportation (Medical): No   Lack of Transportation (Non-Medical): No  Physical Activity: Inactive   Days of Exercise per Week: 0 days   Minutes of Exercise per Session: 0 min  Stress: No Stress Concern Present   Feeling of Stress : Not at all  Social Connections: Socially Integrated   Frequency of Communication with Friends and Family: More than three times a week   Frequency of Social Gatherings with Friends and Family: Once a week   Attends Religious Services: More than 4 times per year   Active Member of [employer] or Organizations: Yes   Attends Music therapist: More than 4 times per year   Marital Status: Married    Family History  Problem Relation Age of Onset   Diabetes Father    Hypertension Father    Heart disease Father    Hypertension Mother    Heart disease Mother    Colon cancer Neg Hx    Esophageal cancer Neg Hx    Rectal cancer Neg Hx    Stomach cancer Neg Hx    Colon polyps Neg Hx     Review of Systems  Constitutional:  Negative for chills and fever.  Respiratory:  Positive for cough (chronic). Negative for shortness of breath and wheezing.   Cardiovascular:  Negative for chest pain, palpitations and leg swelling.  Gastrointestinal:  Negative for abdominal pain and nausea.       No gerd  Neurological:  Negative for light-headedness and headaches.      Objective:    Vitals:   11/03/20 1354  BP: 138/70  Pulse: 76  Temp: 98.7 F (37.1 C)  SpO2: 92%   BP Readings from Last 3 Encounters:  11/03/20 138/70  08/26/20 128/68  07/13/20 138/72   Wt Readings from Last 3 Encounters:  11/03/20 218 lb  9.6 oz (99.2 kg)  08/26/20 219 lb 3.2 oz (99.4 kg)  07/13/20 232 lb 9.6 oz (105.5 kg)   Body mass index is 36.38 kg/m.   Physical Exam    Constitutional: Appears well-developed and well-nourished. No distress.  HENT:  Head: Normocephalic and atraumatic.  Neck: Neck supple. No tracheal deviation present. No thyromegaly present.  No cervical lymphadenopathy Cardiovascular: Normal rate, regular rhythm and normal heart sounds.   No murmur heard. No carotid bruit .  No edema Pulmonary/Chest: Effort normal and breath sounds normal. No respiratory distress. No has no wheezes. No rales.  Skin: Skin is warm and dry. Not diaphoretic.  Psychiatric: Normal mood and affect. Behavior is normal.      Assessment & Plan:    See Problem List for Assessment and Plan of chronic medical problems.    This visit occurred during the SARS-CoV-2 public health emergency.  Safety protocols were in place, including screening questions prior to the visit, additional usage of staff PPE, and extensive cleaning of exam room while observing appropriate contact time as indicated for disinfecting solutions.

## 2020-11-03 ENCOUNTER — Other Ambulatory Visit: Payer: Self-pay

## 2020-11-03 ENCOUNTER — Ambulatory Visit: Payer: Medicare PPO | Admitting: Internal Medicine

## 2020-11-03 VITALS — BP 138/70 | HR 76 | Temp 98.7°F | Ht 65.0 in | Wt 218.6 lb

## 2020-11-03 DIAGNOSIS — I700 Atherosclerosis of aorta: Secondary | ICD-10-CM | POA: Diagnosis not present

## 2020-11-03 DIAGNOSIS — F32A Depression, unspecified: Secondary | ICD-10-CM | POA: Diagnosis not present

## 2020-11-03 DIAGNOSIS — E538 Deficiency of other specified B group vitamins: Secondary | ICD-10-CM | POA: Diagnosis not present

## 2020-11-03 DIAGNOSIS — K5010 Crohn's disease of large intestine without complications: Secondary | ICD-10-CM

## 2020-11-03 DIAGNOSIS — E782 Mixed hyperlipidemia: Secondary | ICD-10-CM

## 2020-11-03 DIAGNOSIS — F419 Anxiety disorder, unspecified: Secondary | ICD-10-CM | POA: Diagnosis not present

## 2020-11-03 DIAGNOSIS — R7303 Prediabetes: Secondary | ICD-10-CM | POA: Diagnosis not present

## 2020-11-03 DIAGNOSIS — I10 Essential (primary) hypertension: Secondary | ICD-10-CM

## 2020-11-03 DIAGNOSIS — N3281 Overactive bladder: Secondary | ICD-10-CM

## 2020-11-03 LAB — LIPID PANEL
Cholesterol: 138 mg/dL (ref 0–200)
HDL: 51.5 mg/dL (ref >39.00)
LDL Cholesterol: 56 mg/dL (ref 0–99)
NonHDL: 86.76
Total CHOL/HDL Ratio: 3
Triglycerides: 156 mg/dL — ABNORMAL HIGH (ref 0.0–149.0)
VLDL: 31.2 mg/dL (ref 0.0–40.0)

## 2020-11-03 LAB — CBC WITH DIFFERENTIAL/PLATELET
Basophils Absolute: 0 10*3/uL (ref 0.0–0.1)
Basophils Relative: 0.5 % (ref 0.0–3.0)
Eosinophils Absolute: 0.2 10*3/uL (ref 0.0–0.7)
Eosinophils Relative: 2.2 % (ref 0.0–5.0)
HCT: 43.2 % (ref 36.0–46.0)
Hemoglobin: 14.3 g/dL (ref 12.0–15.0)
Lymphocytes Relative: 26.9 % (ref 12.0–46.0)
Lymphs Abs: 2.2 10*3/uL (ref 0.7–4.0)
MCHC: 33.1 g/dL (ref 30.0–36.0)
MCV: 88.4 fl (ref 78.0–100.0)
Monocytes Absolute: 0.6 10*3/uL (ref 0.1–1.0)
Monocytes Relative: 7 % (ref 3.0–12.0)
Neutro Abs: 5.2 10*3/uL (ref 1.4–7.7)
Neutrophils Relative %: 63.4 % (ref 43.0–77.0)
Platelets: 211 10*3/uL (ref 150.0–400.0)
RBC: 4.9 Mil/uL (ref 3.87–5.11)
RDW: 13.5 % (ref 11.5–15.5)
WBC: 8.2 10*3/uL (ref 4.0–10.5)

## 2020-11-03 LAB — COMPREHENSIVE METABOLIC PANEL
ALT: 17 U/L (ref 0–35)
AST: 21 U/L (ref 0–37)
Albumin: 4.1 g/dL (ref 3.5–5.2)
Alkaline Phosphatase: 67 U/L (ref 39–117)
BUN: 15 mg/dL (ref 6–23)
CO2: 32 mEq/L (ref 19–32)
Calcium: 9.3 mg/dL (ref 8.4–10.5)
Chloride: 100 mEq/L (ref 96–112)
Creatinine, Ser: 0.97 mg/dL (ref 0.40–1.20)
GFR: 57.96 mL/min — ABNORMAL LOW (ref >60.00)
Glucose, Bld: 87 mg/dL (ref 70–99)
Potassium: 4.1 mEq/L (ref 3.5–5.1)
Sodium: 140 mEq/L (ref 135–145)
Total Bilirubin: 0.5 mg/dL (ref 0.2–1.2)
Total Protein: 7 g/dL (ref 6.0–8.3)

## 2020-11-03 LAB — HEMOGLOBIN A1C: Hgb A1c MFr Bld: 6.3 % (ref 4.6–6.5)

## 2020-11-03 LAB — VITAMIN B12: Vitamin B-12: 263 pg/mL (ref 211–911)

## 2020-11-03 NOTE — Assessment & Plan Note (Signed)
Chronic BP well controlled Continue hctz 25 mg qd cmp

## 2020-11-03 NOTE — Assessment & Plan Note (Signed)
Chronic Not taking B12 consistently Check B12 level

## 2020-11-03 NOTE — Assessment & Plan Note (Signed)
Chronic Fairly controlled, stable Continue myrbetriq 50 mg qd

## 2020-11-03 NOTE — Assessment & Plan Note (Signed)
Chronic Check lipid panel  Continue crestor 20 mg qd Regular exercise and healthy diet encouraged

## 2020-11-03 NOTE — Assessment & Plan Note (Signed)
Chronic Controlled, stable Continue lexparo 20 mg qd

## 2020-11-03 NOTE — Assessment & Plan Note (Signed)
Chronic Continue crestor 20 mg daily Encouraged regular exercise

## 2020-11-03 NOTE — Assessment & Plan Note (Signed)
Chronic Check a1c Low sugar / carb diet Stressed regular exercise  

## 2020-11-03 NOTE — Assessment & Plan Note (Signed)
Chronic Controlled Management per Dr Hilarie Fredrickson On mesalamine

## 2020-11-16 ENCOUNTER — Ambulatory Visit: Payer: Medicare PPO | Admitting: Emergency Medicine

## 2020-12-10 ENCOUNTER — Other Ambulatory Visit: Payer: Self-pay | Admitting: Internal Medicine

## 2020-12-22 ENCOUNTER — Other Ambulatory Visit: Payer: Self-pay | Admitting: Emergency Medicine

## 2021-01-06 ENCOUNTER — Ambulatory Visit (INDEPENDENT_AMBULATORY_CARE_PROVIDER_SITE_OTHER): Payer: Medicare PPO | Admitting: Emergency Medicine

## 2021-01-06 ENCOUNTER — Encounter: Payer: Self-pay | Admitting: Emergency Medicine

## 2021-01-06 ENCOUNTER — Other Ambulatory Visit: Payer: Self-pay

## 2021-01-06 ENCOUNTER — Ambulatory Visit: Payer: Medicare PPO | Admitting: Emergency Medicine

## 2021-01-06 DIAGNOSIS — R918 Other nonspecific abnormal finding of lung field: Secondary | ICD-10-CM

## 2021-01-06 DIAGNOSIS — G4733 Obstructive sleep apnea (adult) (pediatric): Secondary | ICD-10-CM

## 2021-01-06 DIAGNOSIS — J449 Chronic obstructive pulmonary disease, unspecified: Secondary | ICD-10-CM | POA: Diagnosis not present

## 2021-01-06 DIAGNOSIS — R058 Other specified cough: Secondary | ICD-10-CM | POA: Diagnosis not present

## 2021-01-06 DIAGNOSIS — R0609 Other forms of dyspnea: Secondary | ICD-10-CM

## 2021-01-06 LAB — PULMONARY FUNCTION TEST
DL/VA % pred: 131 %
DL/VA: 5.42 ml/min/mmHg/L
DLCO cor % pred: 94 %
DLCO cor: 18.17 ml/min/mmHg
DLCO unc % pred: 94 %
DLCO unc: 18.17 ml/min/mmHg
FEF 25-75 Post: 1.4 L/sec
FEF 25-75 Pre: 0.76 L/sec
FEF2575-%Change-Post: 84 %
FEF2575-%Pred-Post: 79 %
FEF2575-%Pred-Pre: 43 %
FEV1-%Change-Post: 18 %
FEV1-%Pred-Post: 69 %
FEV1-%Pred-Pre: 58 %
FEV1-Post: 1.5 L
FEV1-Pre: 1.27 L
FEV1FVC-%Change-Post: 10 %
FEV1FVC-%Pred-Pre: 90 %
FEV6-%Change-Post: 7 %
FEV6-%Pred-Post: 72 %
FEV6-%Pred-Pre: 67 %
FEV6-Post: 1.99 L
FEV6-Pre: 1.85 L
FEV6FVC-%Change-Post: 0 %
FEV6FVC-%Pred-Post: 104 %
FEV6FVC-%Pred-Pre: 104 %
FVC-%Change-Post: 7 %
FVC-%Pred-Post: 69 %
FVC-%Pred-Pre: 64 %
FVC-Post: 2.01 L
FVC-Pre: 1.86 L
Post FEV1/FVC ratio: 75 %
Post FEV6/FVC ratio: 99 %
Pre FEV1/FVC ratio: 68 %
Pre FEV6/FVC Ratio: 99 %
RV % pred: 102 %
RV: 2.31 L
TLC % pred: 85 %
TLC: 4.35 L

## 2021-01-06 MED ORDER — ALBUTEROL SULFATE HFA 108 (90 BASE) MCG/ACT IN AERS: 2.0000 | INHALATION_SPRAY | Freq: Four times a day (QID) | RESPIRATORY_TRACT | 6 refills | Status: DC | PRN

## 2021-01-06 MED ORDER — BREZTRI AEROSPHERE 160-9-4.8 MCG/ACT IN AERO: 2.0000 | INHALATION_SPRAY | Freq: Two times a day (BID) | RESPIRATORY_TRACT | 0 refills | Status: DC

## 2021-01-06 MED ORDER — ALBUTEROL SULFATE HFA 108 (90 BASE) MCG/ACT IN AERS: 2.0000 | INHALATION_SPRAY | RESPIRATORY_TRACT | 6 refills | Status: DC | PRN

## 2021-01-06 NOTE — Progress Notes (Signed)
PFT done today. 

## 2021-01-06 NOTE — Assessment & Plan Note (Signed)
We will do a trial of Breztri to see if you get any benefit.  Take 2 puffs twice a day on a schedule.  Rinse and gargle after using.  Keep track of whether this helps your breathing and call us to let us know.  If so we will continue it going forward. We will give you a prescription for albuterol.  You can use 2 puffs up to every 4 hours if you need it for shortness of breath

## 2021-01-06 NOTE — Assessment & Plan Note (Signed)
Untreated

## 2021-01-06 NOTE — Assessment & Plan Note (Signed)
We will plan to repeat your CT scan of the chest in May to follow your small pulmonary nodules. Follow with Dr Lamonte Sakai in May 2023 after CT scan to review.

## 2021-01-06 NOTE — Progress Notes (Signed)
Subjective:    Patient ID: Kayla Maddox, female    DOB: Feb 01, 1946, 75 y.o.   MRN: [id number]  HPI 75 year old former smoker (20 pack years), with a history of hyperlipidemia, hypertension, GERD, Crohn's disease, colonic polyps and tubular adenoma colon.  She is here to evaluate an abnormal CT scan of the chest.  She is experiencing exertional SOB with housework, walking over 50 ft she has trouble. Began about 1 yr ago. She has some cough, happens every day, sometimes associated w food or drink. No obvious reflux. She does have some nasal gtt and congestion. She has flonase qd. No other antihistamine.   Cardiac CT 07/09/2019 reviewed by me, showed calcium score 31.5, peripheral right upper lobe pulmonary nodule 4 mm, small 2 to 3 mm nodule at the right major fissure.  CT chest 07/27/2020 reviewed by me shows scattered multiple small pulmonary nodules all less than 4 mm.  The nodule noted in the right upper lobe on cardiac scan is stable in appearance and size.  Possibly some mild airway plugging in the right lower lobe  TTE 04/29/20 >> TTE normal L and R heart fxn.   Pulmonary function testing 02/10/2010 reviewed by me shows normal airflows, no bronchodilator response, normal volumes, normal diffusion capacity.  ROV 01/06/21 --follow-up visit for 75 year old woman with a history of former tobacco (20 pack years), with hypertension, GERD, Crohn's, untreated OSA.  I saw her in June for chronic cough and upper airway irritation, progressive dyspnea (with a reassuring cardiac evaluation).  We also plan to repeat her CT scan of the chest as she had small pulmonary nodules noted on a cardiac CT 06/2019, dedicated CT chest 07/2020. Last time we tried handing loratadine to her fluticasone nasal spray, used Tessalon Perles for cough suppression, tried a GERD diet. She is probably coughing a bit less, still using tessalon a few times a day.   Pulmonary function testing performed today, reviewed by me, shows  evidence for mixed obstruction and restriction with a positive bronchodilator response.  The FEV1 is severely decreased at 1.27 L (58% predicted).  Lung volumes are normal.  Diffusion capacity is normal.   Review of Systems As per HPI  Past Medical History:  Diagnosis Date   Allergic rhinitis    Allergy    Anxiety    Arthritis    Cancer ([place])    skin cancer    Cataract    bilat removed    Chronic colitis    Crohn's colitis ([place])    Diverticulosis    Fundic gland polyps of stomach, benign    Gallbladder polyp 2017   Gastric heterotopia    GERD (gastroesophageal reflux disease)    Hyperlipidemia    Hyperplastic colon polyp    Hypertension    Internal hemorrhoids    Obesity    Serrated adenoma of colon    Sleep apnea    does not use CPAP   Tubular adenoma of colon      Family History  Problem Relation Age of Onset   Diabetes Father    Hypertension Father    Heart disease Father    Hypertension Mother    Heart disease Mother    Colon cancer Neg Hx    Esophageal cancer Neg Hx    Rectal cancer Neg Hx    Stomach cancer Neg Hx    Colon polyps Neg Hx     No family hx lung CA  Social History   Socioeconomic History  Marital status: Married    Spouse name: Not on file   Number of children: 1   Years of education: Not on file   Highest education level: Not on file  Occupational History   Occupation: office clerk/ retired    Fish farm manager: [employer]  Tobacco Use   Smoking status: Former    Packs/day: 1.00    Years: 20.00    Pack years: 20.00    Types: Cigarettes    Quit date: 03/13/1981    Years since quitting: 39.8   Smokeless tobacco: Never  Vaping Use   Vaping Use: Never used  Substance and Sexual Activity   Alcohol use: No   Drug use: No   Sexual activity: Not Currently  Other Topics Concern   Not on file  Social History Narrative   No exercise      Retiring in June 2017   Social Determinants of Health   Financial Resource Strain: Low Risk     Difficulty of Paying Living Expenses: Not hard at all  Food Insecurity: No Food Insecurity   Worried About Charity fundraiser in the Last Year: Never true   Arboriculturist in the Last Year: Never true  Transportation Needs: No Transportation Needs   Lack of Transportation (Medical): No   Lack of Transportation (Non-Medical): No  Physical Activity: Inactive   Days of Exercise per Week: 0 days   Minutes of Exercise per Session: 0 min  Stress: No Stress Concern Present   Feeling of Stress : Not at all  Social Connections: Socially Integrated   Frequency of Communication with Friends and Family: More than three times a week   Frequency of Social Gatherings with Friends and Family: Once a week   Attends Religious Services: More than 4 times per year   Active Member of [employer] or Organizations: Yes   Attends Music therapist: More than 4 times per year   Marital Status: Married  Human resources officer Violence: Not on file    From [place], never lived anywhere else.  Has done office work No other exposures.  Has owned dog, owned cockatiel for 54 yrs, died in May 31, 1979 No pool, no hot tub No TB exposure.    Allergies  Allergen Reactions   Simvastatin     Muscle weakness   Amoxicillin Rash   Clarithromycin Rash   Doxycycline Rash   Prednisone Rash   Sulfonamide Derivatives Rash     Outpatient Medications Prior to Visit  Medication Sig Dispense Refill   ALPRAZolam (XANAX) 0.25 MG tablet Take 1 tablet (0.25 mg total) by mouth 2 (two) times daily as needed for anxiety. 60 tablet 0   Ascorbic Acid (VITAMIN C) 1000 MG tablet Take 1,000 mg by mouth daily.     benzonatate (TESSALON) 200 MG capsule TAKE ONE CAPSULE BY MOUTH THREE TIMES DAILY AS NEEDED FOR COUGH 90 capsule 0   Calcium Carbonate (CALCIUM 500 PO) Take by mouth.     Coenzyme Q10 (CO Q-10) 200 MG CAPS Take 200 mg by mouth.     escitalopram (LEXAPRO) 20 MG tablet TAKE ONE TABLET EVERY DAY 90 tablet 1   fluticasone (FLONASE) 50  MCG/ACT nasal spray INHALE 2 SPRAYS IN EACH NOSTRIL EVERY DAY 16 g 3   hydrochlorothiazide (HYDRODIURIL) 25 MG tablet TAKE ONE TABLET EVERY DAY 90 tablet 3   mesalamine (APRISO) 0.375 g 24 hr capsule TAKE 4 CAPSULES EVERY DAY 120 capsule 2   MYRBETRIQ 50 MG TB24 tablet TAKE  ONE TABLET EVERY DAY 90 tablet 1   vitamin B-12 (CYANOCOBALAMIN) 1000 MCG tablet Take 1 tablet (1,000 mcg total) by mouth daily.     rosuvastatin (CRESTOR) 20 MG tablet Take 1 tablet (20 mg total) by mouth daily. 90 tablet 3   No facility-administered medications prior to visit.         Objective:   Physical Exam Vitals:   01/06/21 1146  BP: 140/78  Pulse: 81  SpO2: 95%  Weight: 220 lb (99.8 kg)  Height: 5' 4"  (1.626 m)   Gen: Pleasant, overwt, in no distress,  normal affect  ENT: No lesions,  mouth clear,  oropharynx clear, no postnasal drip  Neck: No JVD, no stridor  Lungs: No use of accessory muscles, no crackles or wheezing on normal respiration, no wheeze on forced expiration  Cardiovascular: RRR, heart sounds normal, no murmur or gallops, no peripheral edema  Musculoskeletal: No deformities, no cyanosis or clubbing  Neuro: alert, awake, non focal  Skin: Warm, no lesions or rash      Assessment & Plan:   COPD with asthma ([place]) We will do a trial of Breztri to see if you get any benefit.  Take 2 puffs twice a day on a schedule.  Rinse and gargle after using.  Keep track of whether this helps your breathing and call us to let us know.  If so we will continue it going forward. We will give you a prescription for albuterol.  You can use 2 puffs up to every 4 hours if you need it for shortness of breath  OSA (obstructive sleep apnea) Untreated   Upper airway cough syndrome Probably some better with regular treatment of her allergic rhinitis, also benefiting from [employer]  Please continue your fluticasone nasal spray, loratadine as you have been taking them. Okay to continue Tessalon  Perles up to every 6 hours as needed for cough suppression  Pulmonary nodules We will plan to repeat your CT scan of the chest in May to follow your small pulmonary nodules. Follow with Dr Lamonte Sakai in May 2023 after CT scan to review.   Baltazar Apo, MD, PhD 01/06/2021, 12:28 PM [facility] [phone] or if no answer before 7:00PM call [phone] For any issues after 7:00PM please call eLink [phone]

## 2021-01-06 NOTE — Assessment & Plan Note (Signed)
Probably some better with regular treatment of her allergic rhinitis, also benefiting from [employer]  Please continue your fluticasone nasal spray, loratadine as you have been taking them. Okay to continue Tessalon Perles up to every 6 hours as needed for cough suppression

## 2021-01-06 NOTE — Patient Instructions (Addendum)
We will do a trial of Breztri to see if you get any benefit.  Take 2 puffs twice a day on a schedule.  Rinse and gargle after using.  Keep track of whether this helps your breathing and call us to let us know.  If so we will continue it going forward. We will give you a prescription for albuterol.  You can use 2 puffs up to every 4 hours if you need it for shortness of breath Please continue your fluticasone nasal spray, loratadine as you have been taking them. Okay to continue Tessalon Perles up to every 6 hours as needed for cough suppression We will plan to repeat your CT scan of the chest in May to follow your small pulmonary nodules. Follow with Dr Lamonte Sakai in May 2023 after CT scan to review.

## 2021-01-24 ENCOUNTER — Ambulatory Visit (INDEPENDENT_AMBULATORY_CARE_PROVIDER_SITE_OTHER): Payer: Medicare PPO

## 2021-01-24 DIAGNOSIS — Z0000 Encounter for general adult medical examination without abnormal findings: Secondary | ICD-10-CM | POA: Diagnosis not present

## 2021-01-24 MED ORDER — BREZTRI AEROSPHERE 160-9-4.8 MCG/ACT IN AERO: 2.0000 | INHALATION_SPRAY | Freq: Two times a day (BID) | RESPIRATORY_TRACT | 3 refills | Status: DC

## 2021-01-24 NOTE — Progress Notes (Signed)
I connected with Kayla Maddox today by telephone and verified that I am speaking with the correct person using two identifiers. Location patient: home Location provider: work Persons participating in the virtual visit: patient, provider.   I discussed the limitations, risks, security and privacy concerns of performing an evaluation and management service by telephone and the availability of in person appointments. I also discussed with the patient that there may be a patient responsible charge related to this service. The patient expressed understanding and verbally consented to this telephonic visit.    Interactive audio and video telecommunications were attempted between this provider and patient, however failed, due to patient having technical difficulties OR patient did not have access to video capability.  We continued and completed visit with audio only.  Some vital signs may be absent or patient reported.   Time Spent with patient on telephone encounter: 40 minutes  Subjective:   Kayla Maddox is a 75 y.o. female who presents for Medicare Annual (Subsequent) preventive examination.  Review of Systems     Cardiac Risk Factors include: advanced age (>71mn, >>56women);dyslipidemia;family history of premature cardiovascular disease;hypertension;obesity (BMI >30kg/m2)     Objective:    There were no vitals filed for this visit. There is no height or weight on file to calculate BMI.  Advanced Directives 01/24/2021 01/22/2020 01/16/2019 11/29/2017 11/27/2016 08/22/2016 08/07/2013  Does Patient Have a Medical Advance Directive? Yes Yes Yes Yes Yes Yes Patient does not have advance directive  Type of Advance Directive Living will;Healthcare Power of Attorney Living will;Healthcare Power of [place] Living will;Healthcare Power of Attorney -  Does patient want to make  changes to medical advance directive? No - Patient declined No - Patient declined - - - - -  Copy of [place] Chart? No - copy requested No - copy requested No - copy requested No - copy requested No - copy requested - -    Current Medications (verified) Outpatient Encounter Medications as of 01/24/2021  Medication Sig   albuterol (VENTOLIN HFA) 108 (90 Base) MCG/ACT inhaler Inhale 2 puffs into the lungs every 4 (four) hours as needed for wheezing or shortness of breath.   ALPRAZolam (XANAX) 0.25 MG tablet Take 1 tablet (0.25 mg total) by mouth 2 (two) times daily as needed for anxiety.   Ascorbic Acid (VITAMIN C) 1000 MG tablet Take 1,000 mg by mouth daily.   benzonatate (TESSALON) 200 MG capsule TAKE ONE CAPSULE BY MOUTH THREE TIMES DAILY AS NEEDED FOR COUGH   Budeson-Glycopyrrol-Formoterol (BREZTRI AEROSPHERE) 160-9-4.8 MCG/ACT AERO Inhale 2 puffs into the lungs in the morning and at bedtime.   Calcium Carbonate (CALCIUM 500 PO) Take by mouth.   Coenzyme Q10 (CO Q-10) 200 MG CAPS Take 200 mg by mouth.   escitalopram (LEXAPRO) 20 MG tablet TAKE ONE TABLET EVERY DAY   fluticasone (FLONASE) 50 MCG/ACT nasal spray INHALE 2 SPRAYS IN EACH NOSTRIL EVERY DAY   hydrochlorothiazide (HYDRODIURIL) 25 MG tablet TAKE ONE TABLET EVERY DAY   mesalamine (APRISO) 0.375 g 24 hr capsule TAKE 4 CAPSULES EVERY DAY   MYRBETRIQ 50 MG TB24 tablet TAKE ONE TABLET EVERY DAY   rosuvastatin (CRESTOR) 20 MG tablet Take 1 tablet (20 mg total) by mouth daily.   vitamin B-12 (CYANOCOBALAMIN) 1000 MCG tablet Take 1 tablet (1,000 mcg total) by mouth daily.   No facility-administered encounter medications on file as of 01/24/2021.  Allergies (verified) Simvastatin, Amoxicillin, Clarithromycin, Doxycycline, Prednisone, and Sulfonamide derivatives   History: Past Medical History:  Diagnosis Date   Allergic rhinitis    Allergy    Anxiety    Arthritis    Cancer ([place])    skin cancer     Cataract    bilat removed    Chronic colitis    Crohn's colitis ([place])    Diverticulosis    Fundic gland polyps of stomach, benign    Gallbladder polyp 2017   Gastric heterotopia    GERD (gastroesophageal reflux disease)    Hyperlipidemia    Hyperplastic colon polyp    Hypertension    Internal hemorrhoids    Obesity    Serrated adenoma of colon    Sleep apnea    does not use CPAP   Tubular adenoma of colon    Past Surgical History:  Procedure Laterality Date   BLADDER SUSPENSION  2011   cataract surgery Bilateral 2017   COLONOSCOPY  08/22/2016   [facility]   FOOT SURGERY Left 2005   NASAL SINUS SURGERY  2012   POLYPECTOMY     [place]   UPPER GASTROINTESTINAL ENDOSCOPY  2014   Family History  Problem Relation Age of Onset   Diabetes Father    Hypertension Father    Heart disease Father    Hypertension Mother    Heart disease Mother    Colon cancer Neg Hx    Esophageal cancer Neg Hx    Rectal cancer Neg Hx    Stomach cancer Neg Hx    Colon polyps Neg Hx    Social History   Socioeconomic History   Marital status: Married    Spouse name: Not on file   Number of children: 1   Years of education: Not on file   Highest education level: Not on file  Occupational History   Occupation: office clerk/ retired    Fish farm manager: [employer]  Tobacco Use   Smoking status: Former    Packs/day: 1.00    Years: 20.00    Pack years: 20.00    Types: Cigarettes    Quit date: 03/13/1981    Years since quitting: 39.8   Smokeless tobacco: Never  Vaping Use   Vaping Use: Never used  Substance and Sexual Activity   Alcohol use: No   Drug use: No   Sexual activity: Not Currently  Other Topics Concern   Not on file  Social History Narrative   No exercise      Retiring in June 2017   Social Determinants of Health   Financial Resource Strain: Low Risk    Difficulty of Paying Living Expenses: Not hard at all  Food Insecurity: No Food Insecurity   Worried About Paediatric nurse in the Last Year: Never true   Arboriculturist in the Last Year: Never true  Transportation Needs: No Transportation Needs   Lack of Transportation (Medical): No   Lack of Transportation (Non-Medical): No  Physical Activity: Inactive   Days of Exercise per Week: 0 days   Minutes of Exercise per Session: 0 min  Stress: No Stress Concern Present   Feeling of Stress : Not at all  Social Connections: Socially Integrated   Frequency of Communication with Friends and Family: More than three times a week   Frequency of Social Gatherings with Friends and Family: Once a week   Attends Religious Services: More than 4 times per year   Active  Member of Clubs or Organizations: Yes   Attends Music therapist: More than 4 times per year   Marital Status: Married    Tobacco Counseling Counseling given: Not Answered   Clinical Intake:  Pre-visit preparation completed: Yes  Pain : No/denies pain     Nutritional Risks: None Diabetes: No  How often do you need to have someone help you when you read instructions, pamphlets, or other written materials from your doctor or pharmacy?: 1 - Never What is the last grade level you completed in school?: HSG; Associates Degree  Diabetic? no  Interpreter Needed?: No  Information entered by :: Lisette Abu, LPN   Activities of Daily Living In your present state of health, do you have any difficulty performing the following activities: 01/24/2021  Hearing? Y  Comment Wears hearing aids  Vision? N  Difficulty concentrating or making decisions? Y  Walking or climbing stairs? N  Dressing or bathing? N  Doing errands, shopping? N  Preparing Food and eating ? N  Using the Toilet? N  In the past six months, have you accidently leaked urine? Y  Comment Wears protection everyday for leakage  Do you have problems with loss of bowel control? N  Managing your Medications? N  Managing your Finances? N  Housekeeping or  managing your Housekeeping? N  Some recent data might be hidden    Patient Care Team: Binnie Rail, MD as PCP - General (Internal Medicine) Pyrtle, Lajuan Lines, MD as Consulting Physician (Gastroenterology) Ernestine Conrad, MD as Referring Physician (Otolaryngology) Servando Salina, MD as Consulting Physician (Obstetrics and Gynecology) Bjorn Loser, MD as Consulting Physician (Urology) Debara Pickett Nadean Corwin, MD as Consulting Physician (Cardiology) Phylliss Blakes, OD as Consulting Physician (Optometry)  Indicate any recent Medical Services you may have received from other than [facility] providers in the past year (date may be approximate).     Assessment:   This is a routine wellness examination for Brecklynn.  Hearing/Vision screen Hearing Screening - Comments:: Patient currently wears hearing aids. Vision Screening - Comments:: Patient wears corrective glasses/contacts.  Eye exam done annually by: Dr. Samara Snide with [facility]  Dietary issues and exercise activities discussed: Current Exercise Habits: The patient does not participate in regular exercise at present, Exercise limited by: None identified   Goals Addressed               This Visit's Progress     Patient Stated (pt-stated)        My goal is to lose 10 pounds and start back walking again for exercise.      Depression Screen PHQ 2/9 Scores 01/24/2021 01/22/2020 10/28/2019 04/22/2019 01/16/2019 11/29/2017 11/27/2016  PHQ - 2 Score 0 0 0 0 0 1 2  PHQ- 9 Score - - 2 2 0 2 6    Fall Risk Fall Risk  01/24/2021 01/22/2020 04/21/2019 01/16/2019 11/29/2017  Falls in the past year? 0 0 0 0 No  Number falls in past yr: 0 0 0 0 -  Injury with Fall? 0 0 - 0 -  Risk for fall due to : No Fall Risks No Fall Risks - Impaired balance/gait -  Follow up Falls evaluation completed Falls evaluation completed - - -    FALL RISK PREVENTION PERTAINING TO THE HOME:  Any stairs in or around the home? No  If so, are there any  without handrails? No  Home free of loose throw rugs in walkways, pet beds, electrical cords, etc?  Yes  Adequate lighting in your home to reduce risk of falls? Yes   ASSISTIVE DEVICES UTILIZED TO PREVENT FALLS:  Life alert? No  Use of a cane, walker or w/c? No  Grab bars in the bathroom? Yes  Shower chair or bench in shower? No  Elevated toilet seat or a handicapped toilet? Yes   TIMED UP AND GO:  Was the test performed? No .  Length of time to ambulate 10 feet: n/a sec.   Gait steady and fast without use of assistive device  Cognitive Function: Normal cognitive status assessed by direct observation by this Nurse Health Advisor. No abnormalities found.   MMSE - Mini Mental State Exam 11/27/2016  Orientation to time 5  Orientation to Place 5  Registration 3  Attention/ Calculation 5  Recall 3  Language- name 2 objects 2  Language- repeat 1  Language- follow 3 step command 3  Language- read & follow direction 1  Write a sentence 1  Copy design 1  Total score 30        Immunizations Immunization History  Administered Date(s) Administered   Fluad Quad(high Dose 65+) 12/06/2018, 12/17/2020   Influenza Split 12/11/2012   Influenza Whole 01/17/2010   Influenza, High Dose Seasonal PF 11/27/2016, 11/29/2017   Influenza,inj,Quad PF,6+ Mos 01/10/2014   Influenza-Unspecified 12/12/2014, 12/08/2019   PFIZER(Purple Top)SARS-COV-2 Vaccination 04/11/2019, 01/26/2020   Pfizer Covid-19 Vaccine Bivalent Booster 26yr & up 12/17/2020   Pneumococcal Conjugate-13 10/28/2013   Pneumococcal Polysaccharide-23 01/11/2010, 05/04/2015   Tdap 10/28/2013   Zoster, Live 03/14/2011    TDAP status: Up to date  Flu Vaccine status: Up to date  Pneumococcal vaccine status: Up to date  Covid-19 vaccine status: Completed vaccines  Qualifies for Shingles Vaccine? Yes   Zostavax completed Yes   Shingrix Completed?: No.    Education has been provided regarding the importance of this vaccine.  Patient has been advised to call insurance company to determine out of pocket expense if they have not yet received this vaccine. Advised may also receive vaccine at local pharmacy or Health Dept. Verbalized acceptance and understanding.  Screening Tests Health Maintenance  Topic Date Due   Zoster Vaccines- Shingrix (1 of 2) Never done   MAMMOGRAM  04/07/2021   COLONOSCOPY (Pts 45-435yrInsurance coverage will need to be confirmed)  07/09/2023   DEXA SCAN  10/24/2023   TETANUS/TDAP  10/29/2023   Pneumonia Vaccine 6545Years old  Completed   INFLUENZA VACCINE  Completed   COVID-19 Vaccine  Completed   Hepatitis C Screening  Completed   HPV VACCINES  Aged Out    Health Maintenance  Health Maintenance Due  Topic Date Due   Zoster Vaccines- Shingrix (1 of 2) Never done    Colorectal cancer screening: Type of screening: Colonoscopy. Completed 07/08/2020. Repeat every 3 years  Mammogram status: Completed 04/07/2020. Repeat every year  Bone Density status: Completed 10/24/2018. Results reflect: Bone density results: NORMAL. Repeat every 5 years.  Lung Cancer Screening: (Low Dose CT Chest recommended if Age 73-80ears, 30 pack-year currently smoking OR have quit w/in 15years.) does not qualify.   Lung Cancer Screening Referral: no  Additional Screening:  Hepatitis C Screening: does qualify; Completed yes  Vision Screening: Recommended annual ophthalmology exams for early detection of glaucoma and other disorders of the eye. Is the patient up to date with their annual eye exam?  Yes  Who is the provider or what is the name of the office in which the patient attends  annual eye exams? [place], OD If pt is not established with a provider, would they like to be referred to a provider to establish care? No .   Dental Screening: Recommended annual dental exams for proper oral hygiene  Community Resource Referral / Chronic Care Management: CRR required this visit?  No   CCM  required this visit?  No      Plan:     I have personally reviewed and noted the following in the patient's chart:   Medical and social history Use of alcohol, tobacco or illicit drugs  Current medications and supplements including opioid prescriptions.  Functional ability and status Nutritional status Physical activity Advanced directives List of other physicians Hospitalizations, surgeries, and ER visits in previous 12 months Vitals Screenings to include cognitive, depression, and falls Referrals and appointments  In addition, I have reviewed and discussed with patient certain preventive protocols, quality metrics, and best practice recommendations. A written personalized care plan for preventive services as well as general preventive health recommendations were provided to patient.     Sheral Flow, LPN   89/04/2838   Nurse Notes:  Patient is cogitatively intact. There were no vitals filed for this visit. There is no height or weight on file to calculate BMI. Patient stated that she has no issues with gait or balance; does not use any assistive devices. Medications reviewed with patient; no opioid use noted. Hearing Screening - Comments:: Patient currently wears hearing aids. Vision Screening - Comments:: Patient wears corrective glasses/contacts.  Eye exam done annually by: Dr. Samara Snide with [facility]

## 2021-01-24 NOTE — Patient Instructions (Signed)
Kayla Maddox , Thank you for taking time to come for your Medicare Wellness Visit. I appreciate your ongoing commitment to your health goals. Please review the following plan we discussed and let me know if I can assist you in the future.   Screening recommendations/referrals: Colonoscopy: 07/08/2020; due every 3 years Mammogram: 04/07/2020; due every 1-2 years Bone Density: 10/24/2018; due every 5 years (results: normal) Recommended yearly ophthalmology/optometry visit for glaucoma screening and checkup Recommended yearly dental visit for hygiene and checkup  Vaccinations: Influenza vaccine: 12/17/2020 Pneumococcal vaccine: 10/28/2013, 05/04/2015 Tdap vaccine: 10/28/2013; due every 10 years Shingles vaccine: never done; advised to check pharmacy after 03/13/2021 for new immunization coverage   Covid-19: 04/11/2019, 05/06/2019, 01/29/2020, 12/17/2020  Advanced directives: Please bring a copy of your health care power of attorney and living will to the office at your convenience.  Conditions/risks identified: Yes; my goal is to lose 25 pounds and start walking for physical exercise.  Next appointment: Please schedule your next Medicare Wellness Visit with your Nurse Health Advisor in 1 year by calling [phone].   Preventive Care 55 Years and Older, Female Preventive care refers to lifestyle choices and visits with your health care provider that can promote health and wellness. What does preventive care include? A yearly physical exam. This is also called an annual well check. Dental exams once or twice a year. Routine eye exams. Ask your health care provider how often you should have your eyes checked. Personal lifestyle choices, including: Daily care of your teeth and gums. Regular physical activity. Eating a healthy diet. Avoiding tobacco and drug use. Limiting alcohol use. Practicing safe sex. Taking low-dose aspirin every day. Taking vitamin and mineral supplements as recommended by  your health care provider. What happens during an annual well check? The services and screenings done by your health care provider during your annual well check will depend on your age, overall health, lifestyle risk factors, and family history of disease. Counseling  Your health care provider may ask you questions about your: Alcohol use. Tobacco use. Drug use. Emotional well-being. Home and relationship well-being. Sexual activity. Eating habits. History of falls. Memory and ability to understand (cognition). Work and work Statistician. Reproductive health. Screening  You may have the following tests or measurements: Height, weight, and BMI. Blood pressure. Lipid and cholesterol levels. These may be checked every 5 years, or more frequently if you are over 16 years old. Skin check. Lung cancer screening. You may have this screening every year starting at age 13 if you have a 30-pack-year history of smoking and currently smoke or have quit within the past 15 years. Fecal occult blood test (FOBT) of the stool. You may have this test every year starting at age 83. Flexible sigmoidoscopy or colonoscopy. You may have a sigmoidoscopy every 5 years or a colonoscopy every 10 years starting at age 46. Hepatitis C blood test. Hepatitis B blood test. Sexually transmitted disease (STD) testing. Diabetes screening. This is done by checking your blood sugar (glucose) after you have not eaten for a while (fasting). You may have this done every 1-3 years. Bone density scan. This is done to screen for osteoporosis. You may have this done starting at age 2. Mammogram. This may be done every 1-2 years. Talk to your health care provider about how often you should have regular mammograms. Talk with your health care provider about your test results, treatment options, and if necessary, the need for more tests. Vaccines  Your health care provider may  recommend certain vaccines, such as: Influenza  vaccine. This is recommended every year. Tetanus, diphtheria, and acellular pertussis (Tdap, Td) vaccine. You may need a Td booster every 10 years. Zoster vaccine. You may need this after age 80. Pneumococcal 13-valent conjugate (PCV13) vaccine. One dose is recommended after age 59. Pneumococcal polysaccharide (PPSV23) vaccine. One dose is recommended after age 37. Talk to your health care provider about which screenings and vaccines you need and how often you need them. This information is not intended to replace advice given to you by your health care provider. Make sure you discuss any questions you have with your health care provider. Document Released: 03/26/2015 Document Revised: 11/17/2015 Document Reviewed: 12/29/2014 Elsevier Interactive Patient Education  2017 [employer] Prevention in the Home Falls can cause injuries. They can happen to people of all ages. There are many things you can do to make your home safe and to help prevent falls. What can I do on the outside of my home? Regularly fix the edges of walkways and driveways and fix any cracks. Remove anything that might make you trip as you walk through a door, such as a raised step or threshold. Trim any bushes or trees on the path to your home. Use bright outdoor lighting. Clear any walking paths of anything that might make someone trip, such as rocks or tools. Regularly check to see if handrails are loose or broken. Make sure that both sides of any steps have handrails. Any raised decks and porches should have guardrails on the edges. Have any leaves, snow, or ice cleared regularly. Use sand or salt on walking paths during winter. Clean up any spills in your garage right away. This includes oil or grease spills. What can I do in the bathroom? Use night lights. Install grab bars by the toilet and in the tub and shower. Do not use towel bars as grab bars. Use non-skid mats or decals in the tub or shower. If you  need to sit down in the shower, use a plastic, non-slip stool. Keep the floor dry. Clean up any water that spills on the floor as soon as it happens. Remove soap buildup in the tub or shower regularly. Attach bath mats securely with double-sided non-slip rug tape. Do not have throw rugs and other things on the floor that can make you trip. What can I do in the bedroom? Use night lights. Make sure that you have a light by your bed that is easy to reach. Do not use any sheets or blankets that are too big for your bed. They should not hang down onto the floor. Have a firm chair that has side arms. You can use this for support while you get dressed. Do not have throw rugs and other things on the floor that can make you trip. What can I do in the kitchen? Clean up any spills right away. Avoid walking on wet floors. Keep items that you use a lot in easy-to-reach places. If you need to reach something above you, use a strong step stool that has a grab bar. Keep electrical cords out of the way. Do not use floor polish or wax that makes floors slippery. If you must use wax, use non-skid floor wax. Do not have throw rugs and other things on the floor that can make you trip. What can I do with my stairs? Do not leave any items on the stairs. Make sure that there are handrails on both sides of  the stairs and use them. Fix handrails that are broken or loose. Make sure that handrails are as long as the stairways. Check any carpeting to make sure that it is firmly attached to the stairs. Fix any carpet that is loose or worn. Avoid having throw rugs at the top or bottom of the stairs. If you do have throw rugs, attach them to the floor with carpet tape. Make sure that you have a light switch at the top of the stairs and the bottom of the stairs. If you do not have them, ask someone to add them for you. What else can I do to help prevent falls? Wear shoes that: Do not have high heels. Have rubber  bottoms. Are comfortable and fit you well. Are closed at the toe. Do not wear sandals. If you use a stepladder: Make sure that it is fully opened. Do not climb a closed stepladder. Make sure that both sides of the stepladder are locked into place. Ask someone to hold it for you, if possible. Clearly mark and make sure that you can see: Any grab bars or handrails. First and last steps. Where the edge of each step is. Use tools that help you move around (mobility aids) if they are needed. These include: Canes. Walkers. Scooters. Crutches. Turn on the lights when you go into a dark area. Replace any light bulbs as soon as they burn out. Set up your furniture so you have a clear path. Avoid moving your furniture around. If any of your floors are uneven, fix them. If there are any pets around you, be aware of where they are. Review your medicines with your doctor. Some medicines can make you feel dizzy. This can increase your chance of falling. Ask your doctor what other things that you can do to help prevent falls. This information is not intended to replace advice given to you by your health care provider. Make sure you discuss any questions you have with your health care provider. Document Released: 12/24/2008 Document Revised: 08/05/2015 Document Reviewed: 04/03/2014 Elsevier Interactive Patient Education  2017 [employer].

## 2021-02-01 ENCOUNTER — Telehealth: Payer: Self-pay

## 2021-02-01 NOTE — Telephone Encounter (Signed)
Letter has been sent to patient informing them that their sleep study has expired. Patient will need to call and schedule an office visit to re-evaluate the need for a sleep study.    

## 2021-02-04 ENCOUNTER — Other Ambulatory Visit: Payer: Self-pay | Admitting: Internal Medicine

## 2021-02-20 ENCOUNTER — Other Ambulatory Visit: Payer: Self-pay | Admitting: Internal Medicine

## 2021-02-25 ENCOUNTER — Other Ambulatory Visit: Payer: Self-pay | Admitting: *Deleted

## 2021-02-25 MED ORDER — MESALAMINE ER 0.375 G PO CP24: ORAL_CAPSULE | ORAL | 2 refills | Status: AC

## 2021-03-03 DIAGNOSIS — H903 Sensorineural hearing loss, bilateral: Secondary | ICD-10-CM | POA: Diagnosis not present

## 2021-04-07 ENCOUNTER — Other Ambulatory Visit: Payer: Self-pay | Admitting: Internal Medicine

## 2021-04-08 DIAGNOSIS — Z1231 Encounter for screening mammogram for malignant neoplasm of breast: Secondary | ICD-10-CM | POA: Diagnosis not present

## 2021-06-13 ENCOUNTER — Other Ambulatory Visit: Payer: Self-pay | Admitting: Emergency Medicine

## 2021-07-26 ENCOUNTER — Ambulatory Visit
Admission: RE | Admit: 2021-07-26 | Discharge: 2021-07-26 | Disposition: A | Discharge status: Home / Self Care | Payer: Medicare PPO | Source: Ambulatory Visit | Attending: Internal Medicine | Admitting: Internal Medicine

## 2021-07-26 DIAGNOSIS — R918 Other nonspecific abnormal finding of lung field: Secondary | ICD-10-CM

## 2021-07-26 DIAGNOSIS — R911 Solitary pulmonary nodule: Secondary | ICD-10-CM | POA: Diagnosis not present

## 2021-07-26 DIAGNOSIS — G4733 Obstructive sleep apnea (adult) (pediatric): Secondary | ICD-10-CM

## 2021-07-26 DIAGNOSIS — R0609 Other forms of dyspnea: Secondary | ICD-10-CM

## 2021-07-26 DIAGNOSIS — R0602 Shortness of breath: Secondary | ICD-10-CM | POA: Diagnosis not present

## 2021-07-26 DIAGNOSIS — R053 Chronic cough: Secondary | ICD-10-CM | POA: Diagnosis not present

## 2021-07-28 DIAGNOSIS — J069 Acute upper respiratory infection, unspecified: Secondary | ICD-10-CM | POA: Diagnosis not present

## 2021-07-29 ENCOUNTER — Other Ambulatory Visit: Payer: Self-pay | Admitting: Internal Medicine

## 2021-08-12 ENCOUNTER — Encounter: Payer: Self-pay | Admitting: Internal Medicine

## 2021-08-12 NOTE — Patient Instructions (Addendum)
     Blood work was ordered.     Medications changes include :   nasonex nasal spray   Your prescription(s) have been sent to your pharmacy.    A referral was ordered for cardiology.     Someone from that office will call you to schedule an appointment.    Return in about 6 months (around 02/14/2022) for follow up.

## 2021-08-12 NOTE — Progress Notes (Signed)
Subjective:    Patient ID: Kayla Maddox, female    DOB: [birth year], 76 y.o.   MRN: [id number]     HPI Kayla Maddox is here for follow up of her chronic medical problems, including htn, hld prediabetes, anxiety, depression, OAB  She is having some tightness in her chest.  Usually has it when sitting down - lasts a few seconds.  No associated symptoms.  Some SOB with activity -  better with rest.       She has gained some weight.  She is not exercising.  Medications and allergies reviewed with patient and updated if appropriate.  Current Outpatient Medications on File Prior to Visit  Medication Sig Dispense Refill   albuterol (VENTOLIN HFA) 108 (90 Base) MCG/ACT inhaler Inhale 2 puffs into the lungs every 4 (four) hours as needed for wheezing or shortness of breath. 8 g 6   ALPRAZolam (XANAX) 0.25 MG tablet Take 1 tablet (0.25 mg total) by mouth 2 (two) times daily as needed for anxiety. 60 tablet 0   Ascorbic Acid (VITAMIN C) 1000 MG tablet Take 1,000 mg by mouth daily.     benzonatate (TESSALON) 200 MG capsule TAKE ONE CAPSULE BY MOUTH THREE TIMES DAILY AS NEEDED FOR COUGH 90 capsule 0   Calcium Carbonate (CALCIUM 500 PO) Take by mouth.     Coenzyme Q10 (CO Q-10) 200 MG CAPS Take 200 mg by mouth.     escitalopram (LEXAPRO) 20 MG tablet TAKE ONE TABLET BY MOUTH EVERY DAY FOR BLOOD PRESSURE. 30 tablet 0   fluticasone (FLONASE) 50 MCG/ACT nasal spray INHALE 2 SPRAYS IN EACH NOSTRIL EVERY DAY 16 g 3   hydrochlorothiazide (HYDRODIURIL) 25 MG tablet TAKE ONE TABLET EVERY DAY 90 tablet 3   mesalamine (APRISO) 0.375 g 24 hr capsule TAKE 4 CAPSULES EVERY DAY 120 capsule 2   MYRBETRIQ 50 MG TB24 tablet TAKE ONE TABLET EVERY DAY 90 tablet 1   vitamin B-12 (CYANOCOBALAMIN) 1000 MCG tablet Take 1 tablet (1,000 mcg total) by mouth daily.     rosuvastatin (CRESTOR) 20 MG tablet Take 1 tablet (20 mg total) by mouth daily. 90 tablet 3   No current facility-administered medications on file prior to  visit.     Review of Systems  Constitutional:  Positive for fatigue. Negative for fever.  HENT:  Negative for congestion.   Respiratory:  Positive for cough, shortness of breath and wheezing.   Cardiovascular:  Positive for chest pain (at rest - lasts a few seconds). Negative for palpitations and leg swelling.  Neurological:  Positive for light-headedness (occ) and headaches.      Objective:   Vitals:   08/15/21 1453  BP: 138/80  Pulse: 80  Temp: 98.5 F (36.9 C)  SpO2: 95%   BP Readings from Last 3 Encounters:  08/15/21 138/80  01/06/21 140/78  11/03/20 138/70   Wt Readings from Last 3 Encounters:  08/15/21 229 lb (103.9 kg)  01/06/21 220 lb (99.8 kg)  11/03/20 218 lb 9.6 oz (99.2 kg)   Body mass index is 39.31 kg/m.    Physical Exam Constitutional:      General: She is not in acute distress.    Appearance: Normal appearance.  HENT:     Head: Normocephalic and atraumatic.  Eyes:     Conjunctiva/sclera: Conjunctivae normal.  Cardiovascular:     Rate and Rhythm: Normal rate and regular rhythm.     Heart sounds: Normal heart sounds. No murmur heard. Pulmonary:  Effort: Pulmonary effort is normal. No respiratory distress.     Breath sounds: Normal breath sounds. No wheezing.  Musculoskeletal:     Cervical back: Neck supple.     Right lower leg: No edema.     Left lower leg: No edema.  Lymphadenopathy:     Cervical: No cervical adenopathy.  Skin:    General: Skin is warm and dry.     Findings: No rash.  Neurological:     Mental Status: She is alert. Mental status is at baseline.  Psychiatric:        Mood and Affect: Mood normal.        Behavior: Behavior normal.       Lab Results  Component Value Date   WBC 8.2 11/03/2020   HGB 14.3 11/03/2020   HCT 43.2 11/03/2020   PLT 211.0 11/03/2020   GLUCOSE 87 11/03/2020   CHOL 138 11/03/2020   TRIG 156.0 (H) 11/03/2020   HDL 51.50 11/03/2020   LDLDIRECT 156.0 04/21/2019   LDLCALC 56 11/03/2020    ALT 17 11/03/2020   AST 21 11/03/2020   NA 140 11/03/2020   K 4.1 11/03/2020   CL 100 11/03/2020   CREATININE 0.97 11/03/2020   BUN 15 11/03/2020   CO2 32 11/03/2020   TSH 1.60 10/27/2019   HGBA1C 6.3 11/03/2020     Assessment & Plan:    See Problem List for Assessment and Plan of chronic medical problems.

## 2021-08-15 ENCOUNTER — Ambulatory Visit: Payer: Medicare PPO | Admitting: Internal Medicine

## 2021-08-15 ENCOUNTER — Other Ambulatory Visit: Payer: Self-pay | Admitting: Emergency Medicine

## 2021-08-15 VITALS — BP 138/80 | HR 80 | Temp 98.5°F | Ht 64.0 in | Wt 229.0 lb

## 2021-08-15 DIAGNOSIS — E538 Deficiency of other specified B group vitamins: Secondary | ICD-10-CM

## 2021-08-15 DIAGNOSIS — F419 Anxiety disorder, unspecified: Secondary | ICD-10-CM | POA: Diagnosis not present

## 2021-08-15 DIAGNOSIS — I10 Essential (primary) hypertension: Secondary | ICD-10-CM

## 2021-08-15 DIAGNOSIS — E559 Vitamin D deficiency, unspecified: Secondary | ICD-10-CM | POA: Diagnosis not present

## 2021-08-15 DIAGNOSIS — R0609 Other forms of dyspnea: Secondary | ICD-10-CM

## 2021-08-15 DIAGNOSIS — N3281 Overactive bladder: Secondary | ICD-10-CM

## 2021-08-15 DIAGNOSIS — R7303 Prediabetes: Secondary | ICD-10-CM

## 2021-08-15 DIAGNOSIS — R0789 Other chest pain: Secondary | ICD-10-CM | POA: Diagnosis not present

## 2021-08-15 DIAGNOSIS — F32A Depression, unspecified: Secondary | ICD-10-CM

## 2021-08-15 DIAGNOSIS — E782 Mixed hyperlipidemia: Secondary | ICD-10-CM

## 2021-08-15 LAB — COMPREHENSIVE METABOLIC PANEL
ALT: 16 U/L (ref 0–35)
AST: 18 U/L (ref 0–37)
Albumin: 4.2 g/dL (ref 3.5–5.2)
Alkaline Phosphatase: 52 U/L (ref 39–117)
BUN: 12 mg/dL (ref 6–23)
CO2: 35 mEq/L — ABNORMAL HIGH (ref 19–32)
Calcium: 9.4 mg/dL (ref 8.4–10.5)
Chloride: 99 mEq/L (ref 96–112)
Creatinine, Ser: 0.81 mg/dL (ref 0.40–1.20)
GFR: 71.57 mL/min (ref >60.00)
Glucose, Bld: 94 mg/dL (ref 70–99)
Potassium: 3.8 mEq/L (ref 3.5–5.1)
Sodium: 141 mEq/L (ref 135–145)
Total Bilirubin: 0.5 mg/dL (ref 0.2–1.2)
Total Protein: 7.4 g/dL (ref 6.0–8.3)

## 2021-08-15 LAB — CBC WITH DIFFERENTIAL/PLATELET
Basophils Absolute: 0.1 10*3/uL (ref 0.0–0.1)
Basophils Relative: 0.7 % (ref 0.0–3.0)
Eosinophils Absolute: 0.2 10*3/uL (ref 0.0–0.7)
Eosinophils Relative: 2 % (ref 0.0–5.0)
HCT: 44.1 % (ref 36.0–46.0)
Hemoglobin: 14.6 g/dL (ref 12.0–15.0)
Lymphocytes Relative: 26.7 % (ref 12.0–46.0)
Lymphs Abs: 2.2 10*3/uL (ref 0.7–4.0)
MCHC: 33.2 g/dL (ref 30.0–36.0)
MCV: 89.2 fl (ref 78.0–100.0)
Monocytes Absolute: 0.6 10*3/uL (ref 0.1–1.0)
Monocytes Relative: 7 % (ref 3.0–12.0)
Neutro Abs: 5.4 10*3/uL (ref 1.4–7.7)
Neutrophils Relative %: 63.6 % (ref 43.0–77.0)
Platelets: 293 10*3/uL (ref 150.0–400.0)
RBC: 4.94 Mil/uL (ref 3.87–5.11)
RDW: 13.2 % (ref 11.5–15.5)
WBC: 8.4 10*3/uL (ref 4.0–10.5)

## 2021-08-15 LAB — LIPID PANEL
Cholesterol: 147 mg/dL (ref 0–200)
HDL: 52.4 mg/dL (ref >39.00)
LDL Cholesterol: 66 mg/dL (ref 0–99)
NonHDL: 94.33
Total CHOL/HDL Ratio: 3
Triglycerides: 141 mg/dL (ref 0.0–149.0)
VLDL: 28.2 mg/dL (ref 0.0–40.0)

## 2021-08-15 LAB — VITAMIN B12: Vitamin B-12: 434 pg/mL (ref 211–911)

## 2021-08-15 LAB — VITAMIN D 25 HYDROXY (VIT D DEFICIENCY, FRACTURES): VITD: 43.3 ng/mL (ref 30.00–100.00)

## 2021-08-15 MED ORDER — MOMETASONE FUROATE 50 MCG/ACT NA SUSP: 2.0000 | Freq: Every day | NASAL | 12 refills | Status: AC

## 2021-08-15 MED ORDER — ALPRAZOLAM 0.25 MG PO TABS: 0.2500 mg | ORAL_TABLET | Freq: Two times a day (BID) | ORAL | 5 refills | Status: DC | PRN

## 2021-08-15 MED ORDER — ESCITALOPRAM OXALATE 20 MG PO TABS: 20.0000 mg | ORAL_TABLET | Freq: Every day | ORAL | 2 refills | Status: DC

## 2021-08-15 NOTE — Assessment & Plan Note (Signed)
Chronic Continue Myrbetriq 50 mg daily Above medication has helped

## 2021-08-15 NOTE — Assessment & Plan Note (Signed)
Acute on chronic Does have some chronic dyspnea, but she states it is worse recently following with pulmonary She has gained some weight and is not exercising regularly so deconditioning may be a part of this Will refer to cardiology to rule out cardiac cause given atypical chest pain as well

## 2021-08-15 NOTE — Assessment & Plan Note (Signed)
Chronic Taking vitamin D daily Check vitamin D level

## 2021-08-15 NOTE — Assessment & Plan Note (Signed)
Chronic Controlled, Stable Continue Lexapro 20 mg daily, alprazolam 0.25 mg twice daily as needed

## 2021-08-15 NOTE — Assessment & Plan Note (Signed)
Chronic Regular exercise and healthy diet encouraged Check lipid panel  Continue Crestor 20 mg daily

## 2021-08-15 NOTE — Assessment & Plan Note (Signed)
Chronic Not taking B12 daily Check B12 level Stressed the importance of taking B12 on a daily basis

## 2021-08-15 NOTE — Assessment & Plan Note (Signed)
Acute Has had some left-sided chest pain that typically occurs at rest and lasts a few seconds Discussed that this is fairly atypical, but given dyspnea on exertion and risk factors will refer to cardiology for their evaluation Coronary calcium score 31.5 approximately 2 years ago, which is reassuring Continue Crestor 20 mg daily Blood pressure adequately controlled Encouraged regular exercise and weight loss

## 2021-08-15 NOTE — Assessment & Plan Note (Signed)
Chronic Blood pressure well controlled CMP Continue HCTZ 25 mg daily

## 2021-08-15 NOTE — Assessment & Plan Note (Signed)
Chronic Check a1c Low sugar / carb diet Stressed regular exercise  

## 2021-08-16 LAB — HEMOGLOBIN A1C: Hgb A1c MFr Bld: 6.5 % (ref 4.6–6.5)

## 2021-08-18 ENCOUNTER — Other Ambulatory Visit: Payer: Self-pay | Admitting: Internal Medicine

## 2021-08-30 ENCOUNTER — Other Ambulatory Visit: Payer: Self-pay | Admitting: Emergency Medicine

## 2021-09-03 ENCOUNTER — Encounter: Payer: Self-pay | Admitting: Internal Medicine

## 2021-09-05 MED ORDER — OZEMPIC (0.25 OR 0.5 MG/DOSE) 2 MG/1.5ML ~~LOC~~ SOPN: PEN_INJECTOR | SUBCUTANEOUS | 0 refills | Status: DC

## 2021-09-06 ENCOUNTER — Telehealth: Payer: Self-pay | Admitting: Internal Medicine

## 2021-09-06 MED ORDER — OZEMPIC (0.25 OR 0.5 MG/DOSE) 2 MG/3ML ~~LOC~~ SOPN: PEN_INJECTOR | SUBCUTANEOUS | 0 refills | Status: DC

## 2021-09-07 ENCOUNTER — Telehealth: Payer: Self-pay | Admitting: *Deleted

## 2021-09-07 ENCOUNTER — Encounter: Payer: Self-pay | Admitting: Internal Medicine

## 2021-09-07 NOTE — Telephone Encounter (Signed)
Pt was on cover-my-meds need PA on Ozempic. Submitted PA w/ (Key: [id number]) Rec'd msg "Your information has been submitted to [facility]. [employer] will review the request and will issue a decision, typically within 3-7 days".Marland KitchenJohny Chess

## 2021-09-07 NOTE — Telephone Encounter (Signed)
Rec'd determination letter med was DENIED! It states denied under Medicare part D does not cover med. Faxed denial to pof.Marland KitchenJohny Chess

## 2021-09-07 NOTE — Telephone Encounter (Signed)
noted 

## 2021-09-08 ENCOUNTER — Ambulatory Visit: Payer: Medicare PPO | Admitting: Physician Assistant

## 2021-09-12 ENCOUNTER — Encounter: Payer: Self-pay | Admitting: Physician Assistant

## 2021-09-12 ENCOUNTER — Ambulatory Visit: Payer: Medicare PPO | Admitting: Physician Assistant

## 2021-09-12 VITALS — BP 138/72 | HR 81 | Ht 64.0 in | Wt 229.6 lb

## 2021-09-12 DIAGNOSIS — E782 Mixed hyperlipidemia: Secondary | ICD-10-CM | POA: Diagnosis not present

## 2021-09-12 DIAGNOSIS — R072 Precordial pain: Secondary | ICD-10-CM

## 2021-09-12 NOTE — Patient Instructions (Signed)
Medication Instructions:  Your physician recommends that you continue on your current medications as directed. Please refer to the Current Medication list given to you today.  *If you need a refill on your cardiac medications before your next appointment, please call your pharmacy*  Lab Work: NONE ordered at this time of appointment   If you have labs (blood work) drawn today and your tests are completely normal, you will receive your results only by: [place] (if you have MyChart) OR A paper copy in the mail If you have any lab test that is abnormal or we need to change your treatment, we will call you to review the results.  Testing/Procedures: CARDIAC PET- Your physician has requested that you have a Cardiac Pet Stress Test. This testing is completed at [facility] ([address], [place] [address]). The schedulers will call you to get this scheduled. Please follow instructions below and call the office with any questions/concerns [phone]).   Follow-Up: At [facility], you and your health needs are our priority.  As part of our continuing mission to provide you with exceptional heart care, we have created designated Provider Care Teams.  These Care Teams include your primary Cardiologist (physician) and Advanced Practice Providers (APPs -  Physician Assistants and Nurse Practitioners) who all work together to provide you with the care you need, when you need it.  Your next appointment:   1 year(s)  The format for your next appointment:   In Person  Provider:   Pixie Casino, MD     Other Instructions How to Prepare for Your Cardiac PET/CT Stress Test:  1. Please do not take these medications before your test:   Medications that may interfere with the cardiac pharmacological stress agent (ex. nitrates - including erectile dysfunction medications or beta-blockers) the day of the exam. (Erectile dysfunction medication should be held  for at least 72 hrs prior to test) Theophylline containing medications for 12 hours. Dipyridamole 48 hours prior to the test. Your remaining medications may be taken with water.  2. Nothing to eat or drink, except water, 3 hours prior to arrival time.   NO caffeine/decaffeinated products, or chocolate 12 hours prior to arrival.  3. NO perfume, cologne or lotion  4. Total time is 1 to 2 hours; you may want to bring reading material for the waiting time.  5. Please report to Admitting at the [facility] Main Entrance 60 minutes early for your test.  [place],  [id number]  Diabetic Preparation:  Hold oral medications. You may take NPH and Lantus insulin. Do not take Humalog or Humulin R (Regular Insulin) the day of your test. Check blood sugars prior to leaving the house. If able to eat breakfast prior to 3 hour fasting, you may take all medications, including your insulin, Do not worry if you miss your breakfast dose of insulin - start at your next meal.  IF YOU THINK YOU MAY BE PREGNANT, OR ARE NURSING PLEASE INFORM THE TECHNOLOGIST.  In preparation for your appointment, medication and supplies will be purchased.  Appointment availability is limited, so if you need to cancel or reschedule, please call the Radiology Department at [phone]  24 hours in advance to avoid a cancellation fee of $100.00  What to Expect After you Arrive:  Once you arrive and check in for your appointment, you will be taken to a preparation room within the Radiology Department.  A technologist or Nurse  will obtain your medical history, verify that you are correctly prepped for the exam, and explain the procedure.  Afterwards,  an IV will be started in your arm and electrodes will be placed on your skin for EKG monitoring during the stress portion of the exam. Then you will be escorted to the PET/CT scanner.  There, staff will get you positioned on the scanner and obtain a blood  pressure and EKG.  During the exam, you will continue to be connected to the EKG and blood pressure machines.  A small, safe amount of a radioactive tracer will be injected in your IV to obtain a series of pictures of your heart along with an injection of a stress agent.    After your Exam:  It is recommended that you eat a meal and drink a caffeinated beverage to counter act any effects of the stress agent.  Drink plenty of fluids for the remainder of the day and urinate frequently for the first couple of hours after the exam.  Your doctor will inform you of your test results within 7-10 business days.  For questions about your test or how to prepare for your test, please call: Marchia Bond, Cardiac Imaging Nurse Navigator  Gordy Clement, Cardiac Imaging Nurse Navigator Office: [phone]   Important Information About Sugar

## 2021-09-12 NOTE — Progress Notes (Unsigned)
Cardiology Office Note:    Date:  09/14/2021   ID:  Kayla Maddox, DOB 1945-11-02, MRN [id number]  PCP:  Binnie Rail, MD   [facility] Cardiologist:  Pixie Casino, MD     Referring MD: Binnie Rail, MD   No chief complaint on file.   History of Present Illness:    Kayla Maddox is a 76 y.o. female with a hx of hyperlipidemia.  She has no known prior history of CAD or stroke.  Her father did have bypass surgery and died of heart disease.  She was referred to cardiology service for management of hyperlipidemia and was previously followed by Dr. Debara Pickett.  Myoview obtained in April 2018 showed EF 74%, no wall motion abnormality, small defect of moderate severity present in the apical anterior and apex location consistent with previous infarction although this could very well be breast attenuation artifact, overall low risk study, no ischemia was identified.  Previous cardiac scoring test obtained on 07/09/2019 showed 4 mm small nodule in the right lung, coronary calcium score of 31.5 which placed the patient on 52nd percentile for age and sex matched control.  She was last seen by Dr. Debara Pickett in January 2022 at which time she was complaining of some dyspnea on exertion.  Echocardiogram obtained on 04/29/2020 showed EF 60 to 65%, no regional wall motion abnormality, no significant valve issue.  Recent CT of the chest without contrast obtained on 07/26/2021 showed stable small pulmonary nodules bilaterally consistent with benign finding, coronary and aortic atherosclerosis, aberrant right subclavian artery.  She was recently seen by her PCP on 08/15/2021 at which time she complained of intermittent chest discomfort at rest.  Patient says heart left-sided chest tightness has been going on for several months.  It has not increased in frequency or duration.  It typically occurs for 4 to 5 seconds and never lasted longer than 1 minute.  Symptoms typically occurs multiple times in a week.   Symptom does not occur with physical exertion that typically occurs at rest.  She denies any worsening shortness of breath that her usual.  She has no lower extremity edema.  She is under significant amount of stress taking care of her husband who has pulmonary fibrosis.  Her chest pain typically occurs when she is having emotional stress.  The EKG shows no significant change.  I recommended a PET stress test.  She has been instructed to avoid caffeinated drinks for 24 hours prior to the study.  Past Medical History:  Diagnosis Date   Allergic rhinitis    Allergy    Anxiety    Arthritis    Cancer ([place])    skin cancer    Cataract    bilat removed    Chronic colitis    Crohn's colitis ([place])    Diverticulosis    Fundic gland polyps of stomach, benign    Gallbladder polyp 2017   Gastric heterotopia    GERD (gastroesophageal reflux disease)    Hyperlipidemia    Hyperplastic colon polyp    Hypertension    Internal hemorrhoids    Obesity    Serrated adenoma of colon    Sleep apnea    does not use CPAP   Tubular adenoma of colon     Past Surgical History:  Procedure Laterality Date   BLADDER SUSPENSION  2011   cataract surgery Bilateral 2017   COLONOSCOPY  08/22/2016   [place]   FOOT SURGERY Left 2005  NASAL SINUS SURGERY  2012   POLYPECTOMY     [place]   UPPER GASTROINTESTINAL ENDOSCOPY  2014    Current Medications: Current Meds  Medication Sig   albuterol (VENTOLIN HFA) 108 (90 Base) MCG/ACT inhaler Inhale 2 puffs into the lungs every 4 (four) hours as needed for wheezing or shortness of breath.   ALPRAZolam (XANAX) 0.25 MG tablet Take 1 tablet (0.25 mg total) by mouth 2 (two) times daily as needed for anxiety.   Ascorbic Acid (VITAMIN C) 1000 MG tablet Take 1,000 mg by mouth daily.   benzonatate (TESSALON) 200 MG capsule TAKE ONE CAPSULE BY MOUTH THREE TIMES DAILY AS NEEDED FOR COUGH   Calcium Carbonate (CALCIUM 500 PO) Take by mouth.   Coenzyme Q10 (CO Q-10)  200 MG CAPS Take 200 mg by mouth.   escitalopram (LEXAPRO) 20 MG tablet Take 1 tablet (20 mg total) by mouth daily. for blood pressure   hydrochlorothiazide (HYDRODIURIL) 25 MG tablet TAKE ONE TABLET EVERY DAY   mesalamine (APRISO) 0.375 g 24 hr capsule TAKE 4 CAPSULES EVERY DAY   mometasone (NASONEX) 50 MCG/ACT nasal spray Place 2 sprays into the nose daily.   MYRBETRIQ 50 MG TB24 tablet TAKE ONE TABLET EVERY DAY   Semaglutide,0.25 or 0.5MG/DOS, (OZEMPIC, 0.25 OR 0.5 MG/DOSE,) 2 MG/3ML SOPN 0.25 mg [place] weekly x4 weeks, then 0.5 mg  weekly   vitamin B-12 (CYANOCOBALAMIN) 1000 MCG tablet Take 1 tablet (1,000 mcg total) by mouth daily.     Allergies:   Simvastatin, Amoxicillin, Clarithromycin, Doxycycline, Prednisone, and Sulfonamide derivatives   Social History   Socioeconomic History   Marital status: Married    Spouse name: Not on file   Number of children: 1   Years of education: Not on file   Highest education level: Not on file  Occupational History   Occupation: office clerk/ retired    Fish farm manager: [employer]  Tobacco Use   Smoking status: Former    Packs/day: 1.00    Years: 20.00    Total pack years: 20.00    Types: Cigarettes    Quit date: 03/13/1981    Years since quitting: 40.5   Smokeless tobacco: Never  Vaping Use   Vaping Use: Never used  Substance and Sexual Activity   Alcohol use: No   Drug use: No   Sexual activity: Not Currently  Other Topics Concern   Not on file  Social History Narrative   No exercise      Retiring in June 2017   Social Determinants of Health   Financial Resource Strain: Low Risk  (01/24/2021)   Overall Financial Resource Strain (CARDIA)    Difficulty of Paying Living Expenses: Not hard at all  Food Insecurity: No Food Insecurity (01/24/2021)   Hunger Vital Sign    Worried About Running Out of Food in the Last Year: Never true    [place] in the Last Year: Never true  Transportation Needs: No Transportation Needs  (01/24/2021)   PRAPARE - Hydrologist (Medical): No    Lack of Transportation (Non-Medical): No  Physical Activity: Inactive (01/24/2021)   Exercise Vital Sign    Days of Exercise per Week: 0 days    Minutes of Exercise per Session: 0 min  Stress: No Stress Concern Present (01/24/2021)   [place]    Feeling of Stress : Not at all  Social Connections: [place] (01/24/2021)  Social Licensed conveyancer [NHANES]    Frequency of Communication with Friends and Family: More than three times a week    Frequency of Social Gatherings with Friends and Family: Once a week    Attends Religious Services: More than 4 times per year    Active Member of [employer] or Organizations: Yes    Attends Music therapist: More than 4 times per year    Marital Status: Married     Family History: The patient's family history includes Diabetes in her father; Heart disease in her father and mother; Hypertension in her father and mother. There is no history of Colon cancer, Esophageal cancer, Rectal cancer, Stomach cancer, or Colon polyps.  ROS:   Please see the history of present illness.     All other systems reviewed and are negative.  EKGs/Labs/Other Studies Reviewed:    The following studies were reviewed today:  Echo 04/29/2020  1. Left ventricular ejection fraction, by estimation, is 60 to 65%. The  left ventricle has normal function. The left ventricle has no regional  wall motion abnormalities. There is mild eccentric left ventricular  hypertrophy. Left ventricular diastolic  parameters are indeterminate.   2. Right ventricular systolic function is normal. The right ventricular  size is normal.   3. The mitral valve is normal in structure. No evidence of mitral valve  regurgitation   EKG:  EKG is ordered today.  The ekg ordered today demonstrates normal sinus rhythm, no  significant ST-T wave changes, low voltage  Recent Labs: 08/15/2021: ALT 16; BUN 12; Creatinine, Ser 0.81; Hemoglobin 14.6; Platelets 293.0; Potassium 3.8; Sodium 141  Recent Lipid Panel    Component Value Date/Time   CHOL 147 08/15/2021 1610   CHOL 150 11/19/2019 1106   TRIG 141.0 08/15/2021 1610   HDL 52.40 08/15/2021 1610   HDL 54 11/19/2019 1106   CHOLHDL 3 08/15/2021 1610   VLDL 28.2 08/15/2021 1610   LDLCALC 66 08/15/2021 1610   LDLCALC 68 11/19/2019 1106   LDLCALC 65 10/27/2019 1418   LDLDIRECT 156.0 04/21/2019 1214     Risk Assessment/Calculations:           Physical Exam:    VS:  BP 138/72   Pulse 81   Ht 5' 4"  (1.626 m)   Wt 229 lb 9.6 oz (104.1 kg)   SpO2 94%   BMI 39.41 kg/m     Wt Readings from Last 3 Encounters:  09/12/21 229 lb 9.6 oz (104.1 kg)  08/15/21 229 lb (103.9 kg)  01/06/21 220 lb (99.8 kg)     GEN:  Well nourished, well developed in no acute distress HEENT: Normal NECK: No JVD; No carotid bruits LYMPHATICS: No lymphadenopathy CARDIAC: RRR, no murmurs, rubs, gallops RESPIRATORY:  Clear to auscultation without rales, wheezing or rhonchi  ABDOMEN: Soft, non-tender, non-distended MUSCULOSKELETAL:  No edema; No deformity  SKIN: Warm and dry NEUROLOGIC:  Alert and oriented x 3 PSYCHIATRIC:  Normal affect   ASSESSMENT:    1. Precordial pain   2. Mixed hyperlipidemia    PLAN:    In order of problems listed above:  Precordial chest pain: Recent chest discomfort is somewhat atypical, only last 4 to 5 seconds at a time and never lasted longer than a minute.  Symptom does not occur with physical exertion and typically occurs at rest.  There is no significant EKG changes.  I recommended a PET stress test, if negative, would not recommend any further work-up.  Hyperlipidemia: On  Crestor      Shared Decision Making/Informed Consent The risks [chest pain, shortness of breath, cardiac arrhythmias, dizziness, blood pressure fluctuations,  myocardial infarction, stroke/transient ischemic attack, nausea, vomiting, allergic reaction, radiation exposure, metallic taste sensation and life-threatening complications (estimated to be 1 in 10,000)], benefits (risk stratification, diagnosing coronary artery disease, treatment guidance) and alternatives of a cardiac PET stress test were discussed in detail with Ms. Crutchley and she agrees to proceed.    Medication Adjustments/Labs and Tests Ordered: Current medicines are reviewed at length with the patient today.  Concerns regarding medicines are outlined above.  Orders Placed This Encounter  Procedures   NM PET CT CARDIAC PERFUSION MULTI W/ABSOLUTE BLOODFLOW   Cardiac Stress Test: Informed Consent Details: Physician/Practitioner Attestation; Transcribe to consent form and obtain patient signature   EKG 12-Lead   No orders of the defined types were placed in this encounter.   Patient Instructions  Medication Instructions:  Your physician recommends that you continue on your current medications as directed. Please refer to the Current Medication list given to you today.  *If you need a refill on your cardiac medications before your next appointment, please call your pharmacy*  Lab Work: NONE ordered at this time of appointment   If you have labs (blood work) drawn today and your tests are completely normal, you will receive your results only by: [place] (if you have MyChart) OR A paper copy in the mail If you have any lab test that is abnormal or we need to change your treatment, we will call you to review the results.  Testing/Procedures: CARDIAC PET- Your physician has requested that you have a Cardiac Pet Stress Test. This testing is completed at [facility] ([place],  [place] [address]). The schedulers will call you to get this scheduled. Please follow instructions below and call the office with any questions/concerns  [phone]).   Follow-Up: At [facility], you and your health needs are our priority.  As part of our continuing mission to provide you with exceptional heart care, we have created designated Provider Care Teams.  These Care Teams include your primary Cardiologist (physician) and Advanced Practice Providers (APPs -  Physician Assistants and Nurse Practitioners) who all work together to provide you with the care you need, when you need it.  Your next appointment:   1 year(s)  The format for your next appointment:   In Person  Provider:   Pixie Casino, MD     Other Instructions How to Prepare for Your Cardiac PET/CT Stress Test:  1. Please do not take these medications before your test:   Medications that may interfere with the cardiac pharmacological stress agent (ex. nitrates - including erectile dysfunction medications or beta-blockers) the day of the exam. (Erectile dysfunction medication should be held for at least 72 hrs prior to test) Theophylline containing medications for 12 hours. Dipyridamole 48 hours prior to the test. Your remaining medications may be taken with water.  2. Nothing to eat or drink, except water, 3 hours prior to arrival time.   NO caffeine/decaffeinated products, or chocolate 12 hours prior to arrival.  3. NO perfume, cologne or lotion  4. Total time is 1 to 2 hours; you may want to bring reading material for the waiting time.  5. Please report to Admitting at the [facility] Main Entrance 60 minutes early for your test.  [place],  [address]  Diabetic Preparation:  Hold oral  medications. You may take NPH and Lantus insulin. Do not take Humalog or Humulin R (Regular Insulin) the day of your test. Check blood sugars prior to leaving the house. If able to eat breakfast prior to 3 hour fasting, you may take all medications, including your insulin, Do not worry if you miss your breakfast dose of insulin -  start at your next meal.  IF YOU THINK YOU MAY BE PREGNANT, OR ARE NURSING PLEASE INFORM THE TECHNOLOGIST.  In preparation for your appointment, medication and supplies will be purchased.  Appointment availability is limited, so if you need to cancel or reschedule, please call the Radiology Department at [phone]  24 hours in advance to avoid a cancellation fee of $100.00  What to Expect After you Arrive:  Once you arrive and check in for your appointment, you will be taken to a preparation room within the Radiology Department.  A technologist or Nurse will obtain your medical history, verify that you are correctly prepped for the exam, and explain the procedure.  Afterwards,  an IV will be started in your arm and electrodes will be placed on your skin for EKG monitoring during the stress portion of the exam. Then you will be escorted to the PET/CT scanner.  There, staff will get you positioned on the scanner and obtain a blood pressure and EKG.  During the exam, you will continue to be connected to the EKG and blood pressure machines.  A small, safe amount of a radioactive tracer will be injected in your IV to obtain a series of pictures of your heart along with an injection of a stress agent.    After your Exam:  It is recommended that you eat a meal and drink a caffeinated beverage to counter act any effects of the stress agent.  Drink plenty of fluids for the remainder of the day and urinate frequently for the first couple of hours after the exam.  Your doctor will inform you of your test results within 7-10 business days.  For questions about your test or how to prepare for your test, please call: Marchia Bond, Cardiac Imaging Nurse Navigator  Gordy Clement, Cardiac Imaging Nurse Navigator Office: [phone]   Important Information About Sugar         Hilbert Corrigan, [place]  09/14/2021 11:57 PM    [place]

## 2021-09-22 ENCOUNTER — Other Ambulatory Visit: Payer: Self-pay | Admitting: Internal Medicine

## 2021-09-22 ENCOUNTER — Encounter: Payer: Self-pay | Admitting: Emergency Medicine

## 2021-09-22 ENCOUNTER — Ambulatory Visit: Payer: Medicare PPO | Admitting: Emergency Medicine

## 2021-09-22 DIAGNOSIS — J449 Chronic obstructive pulmonary disease, unspecified: Secondary | ICD-10-CM | POA: Diagnosis not present

## 2021-09-22 DIAGNOSIS — R918 Other nonspecific abnormal finding of lung field: Secondary | ICD-10-CM

## 2021-09-22 MED ORDER — ROSUVASTATIN CALCIUM 20 MG PO TABS: 20.0000 mg | ORAL_TABLET | Freq: Every day | ORAL | 3 refills | Status: DC

## 2021-09-22 NOTE — Assessment & Plan Note (Signed)
She benefited significantly from the addition of breztri.  We will plan to continue this.Marland Kitchen  Keep albuterol available.  She will call if she has flaring symptoms.  If so may need to ramp up her rhinitis therapy.

## 2021-09-22 NOTE — Patient Instructions (Signed)
We reviewed your CT scan of the chest today.  The pulmonary nodules are all stable in size and appearance.  This is good news.  We will not need to repeat a CT scan of the chest unless there is some kind of clinical change. Continue your Breztri 2 puffs twice a day.  Rinse and gargle after using. Keep your albuterol available to use 2 puffs if needed for shortness of breath, chest tightness, wheezing. Keep your flu shot and COVID 19 vaccine up-to-date this fall Follow with Dr. Lamonte Sakai in 12 months or sooner if you have any problems.

## 2021-09-22 NOTE — Assessment & Plan Note (Signed)
Small bilateral pulmonary nodules, stable on serial imaging, now for 2 years.  No need to repeat her CT chest unless there is a clinical change of some kind.

## 2021-09-22 NOTE — Progress Notes (Signed)
Subjective:    Patient ID: Kayla Maddox, female    DOB: [birth year], 76 y.o.   MRN: [id number]  HPI  ROV 01/06/21 --follow-up visit for 76 year old woman with a history of former tobacco (20 pack years), with hypertension, GERD, Crohn's, untreated OSA.  I saw her in June for chronic cough and upper airway irritation, progressive dyspnea (with a reassuring cardiac evaluation).  We also plan to repeat her CT scan of the chest as she had small pulmonary nodules noted on a cardiac CT 06/2019, dedicated CT chest 07/2020. Last time we tried handing loratadine to her fluticasone nasal spray, used Tessalon Perles for cough suppression, tried a GERD diet. She is probably coughing a bit less, still using tessalon a few times a day.   Pulmonary function testing performed today, reviewed by me, shows evidence for mixed obstruction and restriction with a positive bronchodilator response.  The FEV1 is severely decreased at 1.27 L (58% predicted).  Lung volumes are normal.  Diffusion capacity is normal.   ROV 09/22/21 --76 year old woman with a history of mixed obstruction and restriction, former tobacco use.  She has chronic cough and upper airway irritation as well, impacted by GERD, chronic rhinitis.  PMH: Hypertension, GERD, Crohn's, untreated OSA.  She has pulmonary nodules that were identified originally on a cardiac CT 06/2019 that we followed with repeat imaging.  At her last visit we tried initiating breztri to see if she would get benefit.  She reports that she has benefited. Less cough and mucous production, has helped her breathing and functional capacity. She is using albuterol a few times a week. She is using nasonex prn.   CT scan of the chest 07/26/2021 reviewed by me, shows scattered small pulmonary nodules largest 4 mm that are stable going back to 06/2019.  No new nodules seen.  No follow-up required.   Review of Systems As per HPI  Past Medical History:  Diagnosis Date   Allergic rhinitis     Allergy    Anxiety    Arthritis    Cancer ([place])    skin cancer    Cataract    bilat removed    Chronic colitis    Crohn's colitis ([place])    Diverticulosis    Fundic gland polyps of stomach, benign    Gallbladder polyp 2017   Gastric heterotopia    GERD (gastroesophageal reflux disease)    Hyperlipidemia    Hyperplastic colon polyp    Hypertension    Internal hemorrhoids    Obesity    Serrated adenoma of colon    Sleep apnea    does not use CPAP   Tubular adenoma of colon      Family History  Problem Relation Age of Onset   Diabetes Father    Hypertension Father    Heart disease Father    Hypertension Mother    Heart disease Mother    Colon cancer Neg Hx    Esophageal cancer Neg Hx    Rectal cancer Neg Hx    Stomach cancer Neg Hx    Colon polyps Neg Hx     No family hx lung CA  Social History   Socioeconomic History   Marital status: Married    Spouse name: Not on file   Number of children: 1   Years of education: Not on file   Highest education level: Not on file  Occupational History   Occupation: office clerk/ retired    Fish farm manager: [employer]  Tobacco  Use   Smoking status: Former    Packs/day: 1.00    Years: 20.00    Total pack years: 20.00    Types: Cigarettes    Quit date: 03/13/1981    Years since quitting: 40.5   Smokeless tobacco: Never  Vaping Use   Vaping Use: Never used  Substance and Sexual Activity   Alcohol use: No   Drug use: No   Sexual activity: Not Currently  Other Topics Concern   Not on file  Social History Narrative   No exercise      Retiring in June 2017   Social Determinants of Health   Financial Resource Strain: Low Risk  (01/24/2021)   Overall Financial Resource Strain (CARDIA)    Difficulty of Paying Living Expenses: Not hard at all  Food Insecurity: No Food Insecurity (01/24/2021)   Hunger Vital Sign    Worried About Running Out of Food in the Last Year: Never true    Ran Out of Food in the Last Year: Never true   Transportation Needs: No Transportation Needs (01/24/2021)   PRAPARE - Hydrologist (Medical): No    Lack of Transportation (Non-Medical): No  Physical Activity: Inactive (01/24/2021)   Exercise Vital Sign    Days of Exercise per Week: 0 days    Minutes of Exercise per Session: 0 min  Stress: No Stress Concern Present (01/24/2021)   [place]    Feeling of Stress : Not at all  Social Connections: [place] (01/24/2021)   Social Connection and Isolation Panel [NHANES]    Frequency of Communication with Friends and Family: More than three times a week    Frequency of Social Gatherings with Friends and Family: Once a week    Attends Religious Services: More than 4 times per year    Active Member of [employer] or Organizations: Yes    Attends Archivist Meetings: More than 4 times per year    Marital Status: Married  Human resources officer Violence: Not At Risk (01/24/2021)   Humiliation, Afraid, Rape, and Kick questionnaire    Fear of Current or Ex-Partner: No    Emotionally Abused: No    Physically Abused: No    Sexually Abused: No    From [place], never lived anywhere else.  Has done office work No other exposures.  Has owned dog, owned cockatiel for 26 yrs, died in May 17, 1979 No pool, no hot tub No TB exposure.    Allergies  Allergen Reactions   Simvastatin     Muscle weakness   Amoxicillin Rash   Clarithromycin Rash   Doxycycline Rash   Prednisone Rash   Sulfonamide Derivatives Rash     Outpatient Medications Prior to Visit  Medication Sig Dispense Refill   albuterol (VENTOLIN HFA) 108 (90 Base) MCG/ACT inhaler Inhale 2 puffs into the lungs every 4 (four) hours as needed for wheezing or shortness of breath. 8 g 6   ALPRAZolam (XANAX) 0.25 MG tablet Take 1 tablet (0.25 mg total) by mouth 2 (two) times daily as needed for anxiety. 60 tablet 5   Ascorbic Acid (VITAMIN C) 1000 MG  tablet Take 1,000 mg by mouth daily.     benzonatate (TESSALON) 200 MG capsule TAKE ONE CAPSULE BY MOUTH THREE TIMES DAILY AS NEEDED FOR COUGH 90 capsule 0   Budeson-Glycopyrrol-Formoterol (BREZTRI AEROSPHERE) 160-9-4.8 MCG/ACT AERO Inhale 2 puffs into the lungs in the morning and at bedtime.  Calcium Carbonate (CALCIUM 500 PO) Take by mouth.     Coenzyme Q10 (CO Q-10) 200 MG CAPS Take 200 mg by mouth.     escitalopram (LEXAPRO) 20 MG tablet Take 1 tablet (20 mg total) by mouth daily. for blood pressure 90 tablet 2   hydrochlorothiazide (HYDRODIURIL) 25 MG tablet TAKE ONE TABLET EVERY DAY 90 tablet 3   mesalamine (APRISO) 0.375 g 24 hr capsule TAKE 4 CAPSULES EVERY DAY 120 capsule 2   mometasone (NASONEX) 50 MCG/ACT nasal spray Place 2 sprays into the nose daily. 1 each 12   MYRBETRIQ 50 MG TB24 tablet TAKE ONE TABLET EVERY DAY 90 tablet 1   vitamin B-12 (CYANOCOBALAMIN) 1000 MCG tablet Take 1 tablet (1,000 mcg total) by mouth daily.     rosuvastatin (CRESTOR) 20 MG tablet Take 1 tablet (20 mg total) by mouth daily. 90 tablet 3   Semaglutide,0.25 or 0.5MG/DOS, (OZEMPIC, 0.25 OR 0.5 MG/DOSE,) 2 MG/3ML SOPN 0.25 mg [place] weekly x4 weeks, then 0.5 mg [place] weekly 3 mL 0   No facility-administered medications prior to visit.         Objective:   Physical Exam Vitals:   09/22/21 1552  BP: 118/64  Pulse: 71  SpO2: 96%  Weight: 230 lb (104.3 kg)  Height: 5' 4"  (1.626 m)   Gen: Pleasant, overwt, in no distress,  normal affect  ENT: No lesions,  mouth clear,  oropharynx clear, no postnasal drip  Neck: No JVD, no stridor  Lungs: No use of accessory muscles, no crackles or wheezing on normal respiration, no wheeze on forced expiration  Cardiovascular: RRR, heart sounds normal, no murmur or gallops, no peripheral edema  Musculoskeletal: No deformities, no cyanosis or clubbing  Neuro: alert, awake, non focal  Skin: Warm, no lesions or rash      Assessment & Plan:   COPD with asthma  ([place]) She benefited significantly from the addition of breztri.  We will plan to continue this.Marland Kitchen  Keep albuterol available.  She will call if she has flaring symptoms.  If so may need to ramp up her rhinitis therapy.  Pulmonary nodules Small bilateral pulmonary nodules, stable on serial imaging, now for 2 years.  No need to repeat her CT chest unless there is a clinical change of some kind.   Baltazar Apo, MD, PhD 09/22/2021, 4:10 PM [facility] [phone] or if no answer before 7:00PM call [phone] For any issues after 7:00PM please call eLink [phone]

## 2021-09-26 DIAGNOSIS — L820 Inflamed seborrheic keratosis: Secondary | ICD-10-CM | POA: Diagnosis not present

## 2021-09-26 DIAGNOSIS — L821 Other seborrheic keratosis: Secondary | ICD-10-CM | POA: Diagnosis not present

## 2021-09-26 DIAGNOSIS — D485 Neoplasm of uncertain behavior of skin: Secondary | ICD-10-CM | POA: Diagnosis not present

## 2021-09-26 DIAGNOSIS — D1801 Hemangioma of skin and subcutaneous tissue: Secondary | ICD-10-CM | POA: Diagnosis not present

## 2021-09-26 DIAGNOSIS — Z08 Encounter for follow-up examination after completed treatment for malignant neoplasm: Secondary | ICD-10-CM | POA: Diagnosis not present

## 2021-09-26 DIAGNOSIS — Z85828 Personal history of other malignant neoplasm of skin: Secondary | ICD-10-CM | POA: Diagnosis not present

## 2021-09-26 DIAGNOSIS — L538 Other specified erythematous conditions: Secondary | ICD-10-CM | POA: Diagnosis not present

## 2021-09-26 DIAGNOSIS — L814 Other melanin hyperpigmentation: Secondary | ICD-10-CM | POA: Diagnosis not present

## 2021-09-26 DIAGNOSIS — Z7189 Other specified counseling: Secondary | ICD-10-CM | POA: Diagnosis not present

## 2021-09-26 DIAGNOSIS — B079 Viral wart, unspecified: Secondary | ICD-10-CM | POA: Diagnosis not present

## 2021-10-03 ENCOUNTER — Other Ambulatory Visit: Payer: Self-pay | Admitting: Internal Medicine

## 2021-11-07 ENCOUNTER — Encounter: Payer: Self-pay | Admitting: Emergency Medicine

## 2021-11-10 NOTE — Telephone Encounter (Signed)
Lets try substituting Symbicort 160/4.5 mcg, 2 puffs twice a day for the [facility].  Have her call us to let us know how she tolerates this.

## 2021-11-11 ENCOUNTER — Other Ambulatory Visit: Payer: Self-pay

## 2021-11-11 MED ORDER — BUDESONIDE-FORMOTEROL FUMARATE 160-4.5 MCG/ACT IN AERO: 2.0000 | INHALATION_SPRAY | Freq: Two times a day (BID) | RESPIRATORY_TRACT | 2 refills | Status: DC

## 2021-12-14 ENCOUNTER — Ambulatory Visit: Payer: Medicare PPO | Admitting: Internal Medicine

## 2021-12-16 DIAGNOSIS — R051 Acute cough: Secondary | ICD-10-CM | POA: Diagnosis not present

## 2021-12-16 DIAGNOSIS — J209 Acute bronchitis, unspecified: Secondary | ICD-10-CM | POA: Diagnosis not present

## 2021-12-20 ENCOUNTER — Inpatient Hospital Stay (HOSPITAL_COMMUNITY): Admission: RE | Admit: 2021-12-20 | Payer: Medicare PPO | Source: Ambulatory Visit

## 2022-01-06 ENCOUNTER — Other Ambulatory Visit: Payer: Self-pay | Admitting: Internal Medicine

## 2022-01-23 ENCOUNTER — Telehealth (HOSPITAL_COMMUNITY): Payer: Self-pay | Admitting: Emergency Medicine

## 2022-01-23 NOTE — Telephone Encounter (Signed)
Reaching out to patient to offer assistance regarding upcoming cardiac imaging study; pt verbalizes understanding of appt date/time, parking situation and where to check in, pre-test NPO status and medications ordered, and verified current allergies; name and call back number provided for further questions should they arise Marchia Bond RN Navigator Cardiac Imaging [facility] [phone] office [phone] cell  Aware to avoid caffeine for 12 h Avoid food 3 hours Daily meds Denies iv issues

## 2022-01-24 ENCOUNTER — Encounter (HOSPITAL_COMMUNITY)
Admission: RE | Admit: 2022-01-24 | Discharge: 2022-01-24 | Disposition: A | Discharge status: Home / Self Care | Payer: Medicare PPO | Source: Ambulatory Visit | Attending: Physician Assistant | Admitting: Physician Assistant

## 2022-01-24 DIAGNOSIS — R072 Precordial pain: Secondary | ICD-10-CM | POA: Diagnosis not present

## 2022-01-24 LAB — NM PET CT CARDIAC PERFUSION MULTI W/ABSOLUTE BLOODFLOW
MBFR: 2.17
Nuc Rest EF: 69 %
Nuc Stress EF: 73 %
Rest MBF: 0.87 ml/g/min
ST Depression (mm): 0 mm
Stress MBF: 1.89 ml/g/min
TID: 1

## 2022-01-24 MED ORDER — REGADENOSON 0.4 MG/5ML IV SOLN
INTRAVENOUS | Status: AC
  Filled 2022-01-24: qty 5

## 2022-01-24 MED ORDER — REGADENOSON 0.4 MG/5ML IV SOLN
0.4000 mg | Freq: Once | INTRAVENOUS | Status: AC
  Administered 2022-01-24: 0.4 mg via INTRAVENOUS

## 2022-01-24 MED ORDER — RUBIDIUM RB82 GENERATOR (RUBYFILL)
27.0000 | PACK | Freq: Once | INTRAVENOUS | Status: AC
  Administered 2022-01-24: 27 via INTRAVENOUS

## 2022-01-25 ENCOUNTER — Ambulatory Visit (INDEPENDENT_AMBULATORY_CARE_PROVIDER_SITE_OTHER): Payer: Medicare PPO

## 2022-01-25 VITALS — Ht 64.0 in

## 2022-01-25 DIAGNOSIS — Z0000 Encounter for general adult medical examination without abnormal findings: Secondary | ICD-10-CM | POA: Diagnosis not present

## 2022-01-25 NOTE — Progress Notes (Signed)
Virtual Visit via Telephone Note  I connected with  Kayla Maddox on 01/25/22 at 10:45 AM EST by telephone and verified that I am speaking with the correct person using two identifiers.  Location: Patient: Home Provider: [place] Persons participating in the virtual visit: [place]   I discussed the limitations, risks, security and privacy concerns of performing an evaluation and management service by telephone and the availability of in person appointments. The patient expressed understanding and agreed to proceed.  Interactive audio and video telecommunications were attempted between this nurse and patient, however failed, due to patient having technical difficulties OR patient did not have access to video capability.  We continued and completed visit with audio only.  Some vital signs may be absent or patient reported.   Sheral Flow, LPN  Subjective:   Kayla Maddox is a 76 y.o. female who presents for Medicare Annual (Subsequent) preventive examination.  Review of Systems     Cardiac Risk Factors include: advanced age (>68mn, >>63women);hypertension;dyslipidemia;family history of premature cardiovascular disease;obesity (BMI >30kg/m2);sedentary lifestyle     Objective:    Today's Vitals   01/25/22 1048  Height: 5' 4"  (1.626 m)  PainSc: 0-No pain   Body mass index is 39.48 kg/m.     01/25/2022   10:50 AM 01/24/2021    1:06 PM 01/22/2020   11:33 AM 01/16/2019   11:06 AM 11/29/2017   11:18 AM 11/27/2016    3:31 PM 08/22/2016    1:38 PM  Advanced Directives  Does Patient Have a Medical Advance Directive? Yes Yes Yes Yes Yes Yes Yes  Type of AParamedicof ANorth [place] MayLiving will Living will;Healthcare Power of Attorney Living will;Healthcare Power of [place] will Living will;Healthcare Power of  Attorney  Does patient want to make changes to medical advance directive?  No - Patient declined No - Patient declined      Copy of [place] Chart? No - copy requested No - copy requested No - copy requested No - copy requested No - copy requested No - copy requested     Current Medications (verified) Outpatient Encounter Medications as of 01/25/2022  Medication Sig   albuterol (VENTOLIN HFA) 108 (90 Base) MCG/ACT inhaler Inhale 2 puffs into the lungs every 4 (four) hours as needed for wheezing or shortness of breath.   ALPRAZolam (XANAX) 0.25 MG tablet Take 1 tablet (0.25 mg total) by mouth 2 (two) times daily as needed for anxiety.   Ascorbic Acid (VITAMIN C) 1000 MG tablet Take 1,000 mg by mouth daily.   benzonatate (TESSALON) 200 MG capsule TAKE ONE CAPSULE BY MOUTH THREE TIMES DAILY AS NEEDED FOR COUGH   Budeson-Glycopyrrol-Formoterol (BREZTRI AEROSPHERE) 160-9-4.8 MCG/ACT AERO Inhale 2 puffs into the lungs in the morning and at bedtime.   budesonide-formoterol (SYMBICORT) 160-4.5 MCG/ACT inhaler Inhale 2 puffs into the lungs 2 (two) times daily.   Calcium Carbonate (CALCIUM 500 PO) Take by mouth.   Coenzyme Q10 (CO Q-10) 200 MG CAPS Take 200 mg by mouth.   escitalopram (LEXAPRO) 20 MG tablet Take 1 tablet (20 mg total) by mouth daily. for blood pressure   hydrochlorothiazide (HYDRODIURIL) 25 MG tablet TAKE ONE TABLET BY MOUTH EVERY DAY   mesalamine (APRISO) 0.375 g 24 hr capsule TAKE 4 CAPSULES EVERY DAY   mometasone (NASONEX) 50 MCG/ACT nasal spray Place 2 sprays into the nose daily.  MYRBETRIQ 50 MG TB24 tablet TAKE ONE TABLET EVERY DAY   rosuvastatin (CRESTOR) 20 MG tablet Take 1 tablet (20 mg total) by mouth daily.   vitamin B-12 (CYANOCOBALAMIN) 1000 MCG tablet Take 1 tablet (1,000 mcg total) by mouth daily.   Facility-Administered Encounter Medications as of 01/25/2022  Medication   regadenoson (LEXISCAN) 0.4 MG/5ML injection SOLN    Allergies  (verified) Simvastatin, Amoxicillin, Clarithromycin, Doxycycline, Prednisone, and Sulfonamide derivatives   History: Past Medical History:  Diagnosis Date   Allergic rhinitis    Allergy    Anxiety    Arthritis    Cancer ([place])    skin cancer    Cataract    bilat removed    Chronic colitis    Crohn's colitis ([place])    Diverticulosis    Fundic gland polyps of stomach, benign    Gallbladder polyp 2017   Gastric heterotopia    GERD (gastroesophageal reflux disease)    Hyperlipidemia    Hyperplastic colon polyp    Hypertension    Internal hemorrhoids    Obesity    Serrated adenoma of colon    Sleep apnea    does not use CPAP   Tubular adenoma of colon    Past Surgical History:  Procedure Laterality Date   BLADDER SUSPENSION  2011   cataract surgery Bilateral 2017   COLONOSCOPY  08/22/2016   [place]   FOOT SURGERY Left 2005   NASAL SINUS SURGERY  2012   POLYPECTOMY     [place]   UPPER GASTROINTESTINAL ENDOSCOPY  2014   Family History  Problem Relation Age of Onset   Diabetes Father    Hypertension Father    Heart disease Father    Hypertension Mother    Heart disease Mother    Colon cancer Neg Hx    Esophageal cancer Neg Hx    Rectal cancer Neg Hx    Stomach cancer Neg Hx    Colon polyps Neg Hx    Social History   Socioeconomic History   Marital status: Married    Spouse name: Not on file   Number of children: 1   Years of education: Not on file   Highest education level: Not on file  Occupational History   Occupation: office clerk/ retired    Fish farm manager: [employer]  Tobacco Use   Smoking status: Former    Packs/day: 1.00    Years: 20.00    Total pack years: 20.00    Types: Cigarettes    Quit date: 03/13/1981    Years since quitting: 40.8   Smokeless tobacco: Never  Vaping Use   Vaping Use: Never used  Substance and Sexual Activity   Alcohol use: No   Drug use: No   Sexual activity: Not Currently  Other Topics Concern   Not on file   Social History Narrative   No exercise      Retiring in June 2017   Social Determinants of Health   Financial Resource Strain: Low Risk  (01/25/2022)   Overall Financial Resource Strain (CARDIA)    Difficulty of Paying Living Expenses: Not hard at all  Food Insecurity: No Food Insecurity (01/25/2022)   Hunger Vital Sign    Worried About Estate manager/land agent of Food in the Last Year: Never true    Ran Out of Food in the Last Year: Never true  Transportation Needs: No Transportation Needs (01/25/2022)   PRAPARE - Hydrologist (Medical): No  Lack of Transportation (Non-Medical): No  Physical Activity: Inactive (01/25/2022)   Exercise Vital Sign    Days of Exercise per Week: 0 days    Minutes of Exercise per Session: 0 min  Stress: No Stress Concern Present (01/25/2022)   [place]    Feeling of Stress : Not at all  Social Connections: [place] (01/25/2022)   Social Connection and Isolation Panel [NHANES]    Frequency of Communication with Friends and Family: More than three times a week    Frequency of Social Gatherings with Friends and Family: Once a week    Attends Religious Services: More than 4 times per year    Active Member of [employer] or Organizations: Yes    Attends Music therapist: More than 4 times per year    Marital Status: Married    Tobacco Counseling Counseling given: Not Answered   Clinical Intake:  Pre-visit preparation completed: Yes  Pain : No/denies pain Pain Score: 0-No pain     BMI - recorded: 39.48 (09/22/2021) Nutritional Status: BMI > 30  Obese Nutritional Risks: None Diabetes: No  How often do you need to have someone help you when you read instructions, pamphlets, or other written materials from your doctor or pharmacy?: 1 - Never What is the last grade level you completed in school?: HSG; Associate's Degree  Diabetic?  No  Interpreter Needed?: No  Information entered by :: Lisette Abu, LPN.   Activities of Daily Living    01/25/2022   10:53 AM  In your present state of health, do you have any difficulty performing the following activities:  Hearing? 0  Vision? 0  Difficulty concentrating or making decisions? 0  Walking or climbing stairs? 0  Dressing or bathing? 0  Doing errands, shopping? 0  Preparing Food and eating ? N  Using the Toilet? N  In the past six months, have you accidently leaked urine? Y  Do you have problems with loss of bowel control? N  Managing your Medications? N  Managing your Finances? N  Housekeeping or managing your Housekeeping? N    Patient Care Team: Binnie Rail, MD as PCP - General (Internal Medicine) Debara Pickett Nadean Corwin, MD as PCP - Cardiology (Cardiology) Pyrtle, Lajuan Lines, MD as Consulting Physician (Gastroenterology) Ernestine Conrad, MD as Referring Physician (Otolaryngology) Servando Salina, MD as Consulting Physician (Obstetrics and Gynecology) Bjorn Loser, MD as Consulting Physician (Urology) Debara Pickett Nadean Corwin, MD as Consulting Physician (Cardiology) Phylliss Blakes, OD as Consulting Physician (Optometry)  Indicate any recent Medical Services you may have received from other than [facility] providers in the past year (date may be approximate).     Assessment:   This is a routine wellness examination for Kayla Maddox.  Hearing/Vision screen Hearing Screening - Comments:: Denies hearing difficulties   Vision Screening - Comments:: Wears rx glasses - up to date with routine eye exams with Zonia Kief, OD   Dietary issues and exercise activities discussed: Current Exercise Habits: The patient does not participate in regular exercise at present, Exercise limited by: respiratory conditions(s)   Goals Addressed   None   Depression Screen    01/25/2022   10:53 AM 08/15/2021    3:12 PM 01/24/2021    1:12 PM 01/22/2020   11:32 AM 10/28/2019    2:32  PM 04/22/2019    4:26 PM 01/16/2019   11:15 AM  PHQ 2/9 Scores  PHQ - 2 Score 0 2 0 0  0 0 0  PHQ- 9 Score  6   2 2  0    Fall Risk    01/25/2022   10:51 AM 08/15/2021    3:13 PM 01/24/2021    1:07 PM 01/22/2020   11:34 AM 04/21/2019   11:17 AM  [place] in the past year? 0 0 0 0 0  Number falls in past yr: 0 0 0 0 0  Injury with Fall? 0 0 0 0   Risk for fall due to : No Fall Risks No Fall Risks No Fall Risks No Fall Risks   Follow up Falls prevention discussed Falls evaluation completed Falls evaluation completed Falls evaluation completed     [place]:  Any stairs in or around the home? No  If so, are there any without handrails? No  Home free of loose throw rugs in walkways, pet beds, electrical cords, etc? Yes  Adequate lighting in your home to reduce risk of falls? Yes   ASSISTIVE DEVICES UTILIZED TO PREVENT FALLS:  Life alert? No  Use of a cane, walker or w/c? No  Grab bars in the bathroom? Yes  Shower chair or bench in shower? Yes  Elevated toilet seat or a handicapped toilet? Yes   TIMED UP AND GO:  Was the test performed? No . Phone Visit  Cognitive Function:    11/27/2016    3:33 PM  MMSE - Mini Mental State Exam  Orientation to time 5  Orientation to Place 5  Registration 3  Attention/ Calculation 5  Recall 3  Language- name 2 objects 2  Language- repeat 1  Language- follow 3 step command 3  Language- read & follow direction 1  Write a sentence 1  Copy design 1  Total score 30        01/25/2022   10:56 AM  6CIT Screen  What Year? 0 points  What month? 0 points  What time? 0 points  Count back from 20 0 points  Months in reverse 0 points  Repeat phrase 0 points  Total Score 0 points    Immunizations Immunization History  Administered Date(s) Administered   COVID-19, mRNA, vaccine(Comirnaty)12 years and older 01/05/2022   Fluad Quad(high Dose 65+) 12/06/2018, 12/17/2020, 11/24/2021   Influenza  Split 12/11/2012   Influenza Whole 01/17/2010   Influenza, High Dose Seasonal PF 11/27/2016, 11/29/2017   Influenza,inj,Quad PF,6+ Mos 01/10/2014   Influenza-Unspecified 12/12/2014, 12/08/2019   PFIZER(Purple Top)SARS-COV-2 Vaccination 04/11/2019, 05/06/2019, 01/26/2020   Pfizer Covid-19 Vaccine Bivalent Booster 70yr & up 12/17/2020   Pneumococcal Conjugate-13 10/28/2013   Pneumococcal Polysaccharide-23 01/11/2010, 05/04/2015   Tdap 10/28/2013   Zoster Recombinat (Shingrix) 11/29/2021   Zoster, Live 03/14/2011    TDAP status: Up to date  Flu Vaccine status: Up to date  Pneumococcal vaccine status: Up to date  Covid-19 vaccine status: Completed vaccines  Qualifies for Shingles Vaccine? Yes   Zostavax completed Yes   Shingrix Completed?: No.    Education has been provided regarding the importance of this vaccine. Patient has been advised to call insurance company to determine out of pocket expense if they have not yet received this vaccine. Advised may also receive vaccine at local pharmacy or Health Dept. Verbalized acceptance and understanding.  Screening Tests Health Maintenance  Topic Date Due   MAMMOGRAM  04/07/2021   COVID-19 Vaccine (5 - Pfizer series) 04/19/2021   Zoster Vaccines- Shingrix (2 of 2) 01/24/2022   Medicare Annual Wellness (AWV)  01/26/2023   COLONOSCOPY (Pts 45-24yr Insurance coverage will need to be confirmed)  07/09/2023   DEXA SCAN  10/24/2023   TETANUS/TDAP  10/29/2023   Pneumonia Vaccine 76 Years old  Completed   INFLUENZA VACCINE  Completed   Hepatitis C Screening  Completed   HPV VACCINES  Aged Out    Health Maintenance  Health Maintenance Due  Topic Date Due   MAMMOGRAM  04/07/2021   COVID-19 Vaccine (5 - Pfizer series) 04/19/2021   Zoster Vaccines- Shingrix (2 of 2) 01/24/2022    Colorectal cancer screening: Type of screening: Colonoscopy. Completed 07/08/2020. Repeat every 3 years  Mammogram status: Completed 04/08/2021. Repeat every  year  Bone Density status: Completed 10/24/2018. Results reflect: Bone density results: NORMAL. Repeat every 5 years.  Lung Cancer Screening: (Low Dose CT Chest recommended if Age 74-80years, 30 pack-year currently smoking OR have quit w/in 15years.) does not qualify.   Lung Cancer Screening Referral: no  Additional Screening:  Hepatitis C Screening: does qualify; Completed 05/04/2015  Vision Screening: Recommended annual ophthalmology exams for early detection of glaucoma and other disorders of the eye. Is the patient up to date with their annual eye exam?  Yes  Who is the provider or what is the name of the office in which the patient attends annual eye exams? RZonia Kief OD. If pt is not established with a provider, would they like to be referred to a provider to establish care? No .   Dental Screening: Recommended annual dental exams for proper oral hygiene  Community Resource Referral / Chronic Care Management: CRR required this visit?  No   CCM required this visit?  No      Plan:     I have personally reviewed and noted the following in the patient's chart:   Medical and social history Use of alcohol, tobacco or illicit drugs  Current medications and supplements including opioid prescriptions. Patient is not currently taking opioid prescriptions. Functional ability and status Nutritional status Physical activity Advanced directives List of other physicians Hospitalizations, surgeries, and ER visits in previous 12 months Vitals Screenings to include cognitive, depression, and falls Referrals and appointments  In addition, I have reviewed and discussed with patient certain preventive protocols, quality metrics, and best practice recommendations. A written personalized care plan for preventive services as well as general preventive health recommendations were provided to patient.     SSheral Flow LPN   [phone]  Nurse Notes: N/A

## 2022-01-25 NOTE — Patient Instructions (Signed)
Kayla Maddox , Thank you for taking time to come for your Medicare Wellness Visit. I appreciate your ongoing commitment to your health goals. Please review the following plan we discussed and let me know if I can assist you in the future.   These are the goals we discussed:  Goals      Patient Stated     Maintain current health status. Enjoy life and family.        This is a list of the screening recommended for you and due dates:  Health Maintenance  Topic Date Due   Mammogram  04/07/2021   COVID-19 Vaccine (5 - Pfizer series) 04/19/2021   Zoster (Shingles) Vaccine (2 of 2) 01/24/2022   Medicare Annual Wellness Visit  01/26/2023   Colon Cancer Screening  07/09/2023   DEXA scan (bone density measurement)  10/24/2023   Tetanus Vaccine  10/29/2023   Pneumonia Vaccine  Completed   Flu Shot  Completed   Hepatitis C Screening: USPSTF Recommendation to screen - Ages 18-79 yo.  Completed   HPV Vaccine  Aged Out    Advanced directives: Yes  Conditions/risks identified: Yes  Next appointment: Follow up in one year for your annual wellness visit.   Preventive Care 47 Years and Older, Female Preventive care refers to lifestyle choices and visits with your health care provider that can promote health and wellness. What does preventive care include? A yearly physical exam. This is also called an annual well check. Dental exams once or twice a year. Routine eye exams. Ask your health care provider how often you should have your eyes checked. Personal lifestyle choices, including: Daily care of your teeth and gums. Regular physical activity. Eating a healthy diet. Avoiding tobacco and drug use. Limiting alcohol use. Practicing safe sex. Taking low-dose aspirin every day. Taking vitamin and mineral supplements as recommended by your health care provider. What happens during an annual well check? The services and screenings done by your health care provider during your annual well  check will depend on your age, overall health, lifestyle risk factors, and family history of disease. Counseling  Your health care provider may ask you questions about your: Alcohol use. Tobacco use. Drug use. Emotional well-being. Home and relationship well-being. Sexual activity. Eating habits. History of falls. Memory and ability to understand (cognition). Work and work Statistician. Reproductive health. Screening  You may have the following tests or measurements: Height, weight, and BMI. Blood pressure. Lipid and cholesterol levels. These may be checked every 5 years, or more frequently if you are over 32 years old. Skin check. Lung cancer screening. You may have this screening every year starting at age 28 if you have a 30-pack-year history of smoking and currently smoke or have quit within the past 15 years. Fecal occult blood test (FOBT) of the stool. You may have this test every year starting at age 86. Flexible sigmoidoscopy or colonoscopy. You may have a sigmoidoscopy every 5 years or a colonoscopy every 10 years starting at age 10. Hepatitis C blood test. Hepatitis B blood test. Sexually transmitted disease (STD) testing. Diabetes screening. This is done by checking your blood sugar (glucose) after you have not eaten for a while (fasting). You may have this done every 1-3 years. Bone density scan. This is done to screen for osteoporosis. You may have this done starting at age 64. Mammogram. This may be done every 1-2 years. Talk to your health care provider about how often you should have regular mammograms. Talk  with your health care provider about your test results, treatment options, and if necessary, the need for more tests. Vaccines  Your health care provider may recommend certain vaccines, such as: Influenza vaccine. This is recommended every year. Tetanus, diphtheria, and acellular pertussis (Tdap, Td) vaccine. You may need a Td booster every 10 years. Zoster  vaccine. You may need this after age 38. Pneumococcal 13-valent conjugate (PCV13) vaccine. One dose is recommended after age 52. Pneumococcal polysaccharide (PPSV23) vaccine. One dose is recommended after age 42. Talk to your health care provider about which screenings and vaccines you need and how often you need them. This information is not intended to replace advice given to you by your health care provider. Make sure you discuss any questions you have with your health care provider. Document Released: 03/26/2015 Document Revised: 11/17/2015 Document Reviewed: 12/29/2014 Elsevier Interactive Patient Education  2017 [place] Prevention in the Home Falls can cause injuries. They can happen to people of all ages. There are many things you can do to make your home safe and to help prevent falls. What can I do on the outside of my home? Regularly fix the edges of walkways and driveways and fix any cracks. Remove anything that might make you trip as you walk through a door, such as a raised step or threshold. Trim any bushes or trees on the path to your home. Use bright outdoor lighting. Clear any walking paths of anything that might make someone trip, such as rocks or tools. Regularly check to see if handrails are loose or broken. Make sure that both sides of any steps have handrails. Any raised decks and porches should have guardrails on the edges. Have any leaves, snow, or ice cleared regularly. Use sand or salt on walking paths during winter. Clean up any spills in your garage right away. This includes oil or grease spills. What can I do in the bathroom? Use night lights. Install grab bars by the toilet and in the tub and shower. Do not use towel bars as grab bars. Use non-skid mats or decals in the tub or shower. If you need to sit down in the shower, use a plastic, non-slip stool. Keep the floor dry. Clean up any water that spills on the floor as soon as it happens. Remove  soap buildup in the tub or shower regularly. Attach bath mats securely with double-sided non-slip rug tape. Do not have throw rugs and other things on the floor that can make you trip. What can I do in the bedroom? Use night lights. Make sure that you have a light by your bed that is easy to reach. Do not use any sheets or blankets that are too big for your bed. They should not hang down onto the floor. Have a firm chair that has side arms. You can use this for support while you get dressed. Do not have throw rugs and other things on the floor that can make you trip. What can I do in the kitchen? Clean up any spills right away. Avoid walking on wet floors. Keep items that you use a lot in easy-to-reach places. If you need to reach something above you, use a strong step stool that has a grab bar. Keep electrical cords out of the way. Do not use floor polish or wax that makes floors slippery. If you must use wax, use non-skid floor wax. Do not have throw rugs and other things on the floor that can make you  trip. What can I do with my stairs? Do not leave any items on the stairs. Make sure that there are handrails on both sides of the stairs and use them. Fix handrails that are broken or loose. Make sure that handrails are as long as the stairways. Check any carpeting to make sure that it is firmly attached to the stairs. Fix any carpet that is loose or worn. Avoid having throw rugs at the top or bottom of the stairs. If you do have throw rugs, attach them to the floor with carpet tape. Make sure that you have a light switch at the top of the stairs and the bottom of the stairs. If you do not have them, ask someone to add them for you. What else can I do to help prevent falls? Wear shoes that: Do not have high heels. Have rubber bottoms. Are comfortable and fit you well. Are closed at the toe. Do not wear sandals. If you use a stepladder: Make sure that it is fully opened. Do not climb a  closed stepladder. Make sure that both sides of the stepladder are locked into place. Ask someone to hold it for you, if possible. Clearly mark and make sure that you can see: Any grab bars or handrails. First and last steps. Where the edge of each step is. Use tools that help you move around (mobility aids) if they are needed. These include: Canes. Walkers. Scooters. Crutches. Turn on the lights when you go into a dark area. Replace any light bulbs as soon as they burn out. Set up your furniture so you have a clear path. Avoid moving your furniture around. If any of your floors are uneven, fix them. If there are any pets around you, be aware of where they are. Review your medicines with your doctor. Some medicines can make you feel dizzy. This can increase your chance of falling. Ask your doctor what other things that you can do to help prevent falls. This information is not intended to replace advice given to you by your health care provider. Make sure you discuss any questions you have with your health care provider. Document Released: 12/24/2008 Document Revised: 08/05/2015 Document Reviewed: 04/03/2014 Elsevier Interactive Patient Education  2017 [employer].

## 2022-02-12 NOTE — Patient Instructions (Addendum)
      Blood work was ordered.   The lab is on the first floor.    Medications changes include :   none      Return in about 6 months (around 08/15/2022) for follow up.

## 2022-02-12 NOTE — Progress Notes (Signed)
Subjective:    Patient ID: Kayla Maddox, female    DOB: March 19, 1945, 76 y.o.   MRN: [id number]     HPI Kayla Maddox is here for follow up of her chronic medical problems, including htn, hld, prediabetes,  anxiety, depression, OAB  A1c 6.5% last visit  Recall is slower.  Overall doing well.  Medications and allergies reviewed with patient and updated if appropriate.  Current Outpatient Medications on File Prior to Visit  Medication Sig Dispense Refill   albuterol (VENTOLIN HFA) 108 (90 Base) MCG/ACT inhaler Inhale 2 puffs into the lungs every 4 (four) hours as needed for wheezing or shortness of breath. 8 g 6   ALPRAZolam (XANAX) 0.25 MG tablet Take 1 tablet (0.25 mg total) by mouth 2 (two) times daily as needed for anxiety. 60 tablet 5   Ascorbic Acid (VITAMIN C) 1000 MG tablet Take 1,000 mg by mouth daily.     benzonatate (TESSALON) 200 MG capsule TAKE ONE CAPSULE BY MOUTH THREE TIMES DAILY AS NEEDED FOR COUGH 90 capsule 0   Budeson-Glycopyrrol-Formoterol (BREZTRI AEROSPHERE) 160-9-4.8 MCG/ACT AERO Inhale 2 puffs into the lungs in the morning and at bedtime.     Calcium Carbonate (CALCIUM 500 PO) Take by mouth.     Coenzyme Q10 (CO Q-10) 200 MG CAPS Take 200 mg by mouth.     escitalopram (LEXAPRO) 20 MG tablet Take 1 tablet (20 mg total) by mouth daily. for blood pressure 90 tablet 2   hydrochlorothiazide (HYDRODIURIL) 25 MG tablet TAKE ONE TABLET BY MOUTH EVERY DAY 90 tablet 3   mometasone (NASONEX) 50 MCG/ACT nasal spray Place 2 sprays into the nose daily. 1 each 12   MYRBETRIQ 50 MG TB24 tablet TAKE ONE TABLET EVERY DAY 90 tablet 1   rosuvastatin (CRESTOR) 20 MG tablet Take 1 tablet (20 mg total) by mouth daily. 90 tablet 3   vitamin B-12 (CYANOCOBALAMIN) 1000 MCG tablet Take 1 tablet (1,000 mcg total) by mouth daily.     budesonide-formoterol (SYMBICORT) 160-4.5 MCG/ACT inhaler Inhale 2 puffs into the lungs 2 (two) times daily. (Patient not taking: Reported on 02/13/2022) 1 each 2    mesalamine (APRISO) 0.375 g 24 hr capsule TAKE 4 CAPSULES EVERY DAY (Patient not taking: Reported on 02/13/2022) 120 capsule 2   No current facility-administered medications on file prior to visit.     Review of Systems  Constitutional:  Negative for chills and fever.  Respiratory:  Positive for cough (from astham - improved). Negative for shortness of breath and wheezing.   Cardiovascular:  Negative for chest pain, palpitations and leg swelling.  Neurological:  Positive for light-headedness (occ - with standing or sitting). Negative for headaches.       Objective:   Vitals:   02/13/22 1323  BP: 126/74  Pulse: 70  Temp: 98.1 F (36.7 C)  SpO2: 96%   BP Readings from Last 3 Encounters:  02/13/22 126/74  01/24/22 (!) 107/51  09/22/21 118/64   Wt Readings from Last 3 Encounters:  02/13/22 228 lb (103.4 kg)  09/22/21 230 lb (104.3 kg)  09/12/21 229 lb 9.6 oz (104.1 kg)   Body mass index is 39.14 kg/m.    Physical Exam Constitutional:      General: She is not in acute distress.    Appearance: Normal appearance.  HENT:     Head: Normocephalic and atraumatic.  Eyes:     Conjunctiva/sclera: Conjunctivae normal.  Cardiovascular:     Rate and Rhythm: Normal rate  and regular rhythm.     Heart sounds: Normal heart sounds. No murmur heard. Pulmonary:     Effort: Pulmonary effort is normal. No respiratory distress.     Breath sounds: Normal breath sounds. No wheezing.  Musculoskeletal:     Cervical back: Neck supple.     Right lower leg: No edema.     Left lower leg: No edema.  Lymphadenopathy:     Cervical: No cervical adenopathy.  Skin:    General: Skin is warm and dry.     Findings: No rash.  Neurological:     Mental Status: She is alert. Mental status is at baseline.  Psychiatric:        Mood and Affect: Mood normal.        Behavior: Behavior normal.        Lab Results  Component Value Date   WBC 8.4 08/15/2021   HGB 14.6 08/15/2021   HCT 44.1  08/15/2021   PLT 293.0 08/15/2021   GLUCOSE 94 08/15/2021   CHOL 147 08/15/2021   TRIG 141.0 08/15/2021   HDL 52.40 08/15/2021   LDLDIRECT 156.0 04/21/2019   LDLCALC 66 08/15/2021   ALT 16 08/15/2021   AST 18 08/15/2021   NA 141 08/15/2021   K 3.8 08/15/2021   CL 99 08/15/2021   CREATININE 0.81 08/15/2021   BUN 12 08/15/2021   CO2 35 (H) 08/15/2021   TSH 1.60 10/27/2019   HGBA1C 6.5 08/15/2021     Assessment & Plan:    See Problem List for Assessment and Plan of chronic medical problems.

## 2022-02-13 ENCOUNTER — Ambulatory Visit: Payer: Medicare PPO | Admitting: Internal Medicine

## 2022-02-13 ENCOUNTER — Encounter: Payer: Self-pay | Admitting: Internal Medicine

## 2022-02-13 VITALS — BP 126/74 | HR 70 | Temp 98.1°F | Ht 64.0 in | Wt 228.0 lb

## 2022-02-13 DIAGNOSIS — N3281 Overactive bladder: Secondary | ICD-10-CM | POA: Diagnosis not present

## 2022-02-13 DIAGNOSIS — R7303 Prediabetes: Secondary | ICD-10-CM | POA: Diagnosis not present

## 2022-02-13 DIAGNOSIS — E782 Mixed hyperlipidemia: Secondary | ICD-10-CM | POA: Diagnosis not present

## 2022-02-13 DIAGNOSIS — F32A Depression, unspecified: Secondary | ICD-10-CM

## 2022-02-13 DIAGNOSIS — I10 Essential (primary) hypertension: Secondary | ICD-10-CM

## 2022-02-13 DIAGNOSIS — F419 Anxiety disorder, unspecified: Secondary | ICD-10-CM

## 2022-02-13 LAB — COMPREHENSIVE METABOLIC PANEL
ALT: 17 U/L (ref 0–35)
AST: 21 U/L (ref 0–37)
Albumin: 4.4 g/dL (ref 3.5–5.2)
Alkaline Phosphatase: 54 U/L (ref 39–117)
BUN: 12 mg/dL (ref 6–23)
CO2: 33 mEq/L — ABNORMAL HIGH (ref 19–32)
Calcium: 9.4 mg/dL (ref 8.4–10.5)
Chloride: 102 mEq/L (ref 96–112)
Creatinine, Ser: 0.92 mg/dL (ref 0.40–1.20)
GFR: 61.21 mL/min (ref >60.00)
Glucose, Bld: 79 mg/dL (ref 70–99)
Potassium: 4.1 mEq/L (ref 3.5–5.1)
Sodium: 141 mEq/L (ref 135–145)
Total Bilirubin: 0.5 mg/dL (ref 0.2–1.2)
Total Protein: 7.3 g/dL (ref 6.0–8.3)

## 2022-02-13 LAB — LIPID PANEL
Cholesterol: 132 mg/dL (ref 0–200)
HDL: 56.1 mg/dL (ref >39.00)
LDL Cholesterol: 48 mg/dL (ref 0–99)
NonHDL: 75.94
Total CHOL/HDL Ratio: 2
Triglycerides: 142 mg/dL (ref 0.0–149.0)
VLDL: 28.4 mg/dL (ref 0.0–40.0)

## 2022-02-13 LAB — HEMOGLOBIN A1C: Hgb A1c MFr Bld: 6.3 % (ref 4.6–6.5)

## 2022-02-13 NOTE — Assessment & Plan Note (Signed)
Chronic Improved with medication Continue Myrbetriq 50 mg daily

## 2022-02-13 NOTE — Assessment & Plan Note (Signed)
Chronic Regular exercise and healthy diet encouraged Check lipid panel  Continue Crestor 20 mg daily

## 2022-02-13 NOTE — Assessment & Plan Note (Addendum)
Chronic A1c 6 months ago 6.5%-discussed that if it is 6.5% or higher again she is at her to diabetic Check a1c Low sugar / carb diet Stressed regular exercise

## 2022-02-13 NOTE — Assessment & Plan Note (Addendum)
Chronic Controlled, Stable Continue Lexapro 20 mg daily, Xanax  0.25 mg twice daily as needed

## 2022-02-13 NOTE — Assessment & Plan Note (Signed)
Chronic Blood pressure well controlled CMP Continue hydrochlorothiazide 25 mg daily

## 2022-02-26 ENCOUNTER — Other Ambulatory Visit: Payer: Self-pay | Admitting: Emergency Medicine

## 2022-03-29 DIAGNOSIS — D1801 Hemangioma of skin and subcutaneous tissue: Secondary | ICD-10-CM | POA: Diagnosis not present

## 2022-03-29 DIAGNOSIS — Z08 Encounter for follow-up examination after completed treatment for malignant neoplasm: Secondary | ICD-10-CM | POA: Diagnosis not present

## 2022-03-29 DIAGNOSIS — L814 Other melanin hyperpigmentation: Secondary | ICD-10-CM | POA: Diagnosis not present

## 2022-03-29 DIAGNOSIS — Z7189 Other specified counseling: Secondary | ICD-10-CM | POA: Diagnosis not present

## 2022-03-29 DIAGNOSIS — L820 Inflamed seborrheic keratosis: Secondary | ICD-10-CM | POA: Diagnosis not present

## 2022-03-29 DIAGNOSIS — L720 Epidermal cyst: Secondary | ICD-10-CM | POA: Diagnosis not present

## 2022-03-29 DIAGNOSIS — Z85828 Personal history of other malignant neoplasm of skin: Secondary | ICD-10-CM | POA: Diagnosis not present

## 2022-03-29 DIAGNOSIS — L538 Other specified erythematous conditions: Secondary | ICD-10-CM | POA: Diagnosis not present

## 2022-04-17 DIAGNOSIS — Z1231 Encounter for screening mammogram for malignant neoplasm of breast: Secondary | ICD-10-CM | POA: Diagnosis not present

## 2022-04-17 LAB — HM MAMMOGRAPHY

## 2022-05-05 DIAGNOSIS — M85852 Other specified disorders of bone density and structure, left thigh: Secondary | ICD-10-CM | POA: Diagnosis not present

## 2022-05-05 DIAGNOSIS — Z780 Asymptomatic menopausal state: Secondary | ICD-10-CM | POA: Diagnosis not present

## 2022-05-05 LAB — HM DEXA SCAN

## 2022-05-12 ENCOUNTER — Encounter: Payer: Self-pay | Admitting: Internal Medicine

## 2022-05-12 DIAGNOSIS — M8580 Other specified disorders of bone density and structure, unspecified site: Secondary | ICD-10-CM | POA: Insufficient documentation

## 2022-05-18 ENCOUNTER — Encounter: Payer: Self-pay | Admitting: Internal Medicine

## 2022-05-18 NOTE — Progress Notes (Signed)
Outside notes received. Information abstracted. Notes sent to scan.  

## 2022-05-30 ENCOUNTER — Other Ambulatory Visit: Payer: Self-pay | Admitting: Internal Medicine

## 2022-06-01 DIAGNOSIS — M79642 Pain in left hand: Secondary | ICD-10-CM | POA: Diagnosis not present

## 2022-06-01 DIAGNOSIS — M1812 Unilateral primary osteoarthritis of first carpometacarpal joint, left hand: Secondary | ICD-10-CM | POA: Diagnosis not present

## 2022-06-02 ENCOUNTER — Telehealth: Payer: Self-pay | Admitting: Emergency Medicine

## 2022-06-02 MED ORDER — BREZTRI AEROSPHERE 160-9-4.8 MCG/ACT IN AERO: INHALATION_SPRAY | RESPIRATORY_TRACT | 2 refills | Status: DC

## 2022-06-02 NOTE — Telephone Encounter (Signed)
Called patient directly. Advised her that the local pharmacy is requesting that we send her Breztri inhaler to [facility]. She stated that she is ok with this. RX has been sent to [facility].   Nothing further needed at time of call.

## 2022-06-02 NOTE — Telephone Encounter (Signed)
Kayla Maddox at [facility]  wondering if we got her fax for a 90 day supply of Breztri to be sent to [facility]?  Any questions please call Kayla Maddox at [phone]

## 2022-07-14 ENCOUNTER — Other Ambulatory Visit: Payer: Self-pay | Admitting: Internal Medicine

## 2022-08-15 ENCOUNTER — Encounter: Payer: Self-pay | Admitting: Internal Medicine

## 2022-08-15 NOTE — Patient Instructions (Addendum)
      Blood work was ordered.   The lab is on the first floor.    Medications changes include :   restart omeprazole 20 mg daily   See Gynecology    Return in about 6 months (around 02/15/2023) for Physical Exam.  

## 2022-08-15 NOTE — Progress Notes (Unsigned)
Subjective:    Patient ID: Kayla Maddox, female    DOB: [birth year], 77 y.o.   MRN: [id number]     HPI Armina is here for follow up of her chronic medical problems.  Intermittently gets hot - sweats - mostly head - at rest and with activity.  Often happens without doing anything and she was wondering what the cause was.  No associated symptoms.  Inc HA's - from TMJ.  Her jaw popped out of the socket and did go back in-she saw her dentist.  She has been having discomfort since then-mostly when eating.  It does not seem like it is getting better.  Stopped crestor - 2 weeks ago - due to muscle pain.  Still having discomfort.  Medications and allergies reviewed with patient and updated if appropriate.  Current Outpatient Medications on File Prior to Visit  Medication Sig Dispense Refill   albuterol (VENTOLIN HFA) 108 (90 Base) MCG/ACT inhaler Inhale 2 puffs into the lungs every 4 (four) hours as needed for wheezing or shortness of breath. 8 g 6   ALPRAZolam (XANAX) 0.25 MG tablet Take 1 tablet (0.25 mg total) by mouth 2 (two) times daily as needed for anxiety. 60 tablet 5   Ascorbic Acid (VITAMIN C) 1000 MG tablet Take 1,000 mg by mouth daily.     benzonatate (TESSALON) 200 MG capsule TAKE ONE CAPSULE BY MOUTH THREE TIMES DAILY AS NEEDED FOR COUGH 90 capsule 0   Budeson-Glycopyrrol-Formoterol (BREZTRI AEROSPHERE) 160-9-4.8 MCG/ACT AERO INHALE 2 PUFFS TWICE DAILY IN THE MORNING & AT BEDTIME 32.1 g 2   Calcium Carbonate (CALCIUM 500 PO) Take by mouth.     Coenzyme Q10 (CO Q-10) 200 MG CAPS Take 200 mg by mouth.     escitalopram (LEXAPRO) 20 MG tablet TAKE ONE TABLET BY MOUTH DAILY FOR DEPRESSION 90 tablet 2   hydrochlorothiazide (HYDRODIURIL) 25 MG tablet TAKE ONE TABLET BY MOUTH EVERY DAY 90 tablet 3   mesalamine (APRISO) 0.375 g 24 hr capsule TAKE 4 CAPSULES EVERY DAY 120 capsule 2   mometasone (NASONEX) 50 MCG/ACT nasal spray Place 2 sprays into the nose daily. 1 each 12   MYRBETRIQ  50 MG TB24 tablet TAKE ONE TABLET BY MOUTH DAILY 90 tablet 1   rosuvastatin (CRESTOR) 20 MG tablet Take 1 tablet (20 mg total) by mouth daily. 90 tablet 3   vitamin B-12 (CYANOCOBALAMIN) 1000 MCG tablet Take 1 tablet (1,000 mcg total) by mouth daily.     No current facility-administered medications on file prior to visit.     Review of Systems  Constitutional:  Negative for chills and fever.  Respiratory:  Positive for cough and shortness of breath (with exertion - same). Negative for wheezing.   Cardiovascular:  Negative for chest pain, palpitations and leg swelling.  Neurological:  Positive for headaches (from TMJ). Negative for light-headedness.       Objective:   Vitals:   08/16/22 1324  BP: 126/74  Pulse: 60  Temp: 98.1 F (36.7 C)  SpO2: 91%   BP Readings from Last 3 Encounters:  08/16/22 126/74  02/13/22 126/74  01/24/22 (!) 107/51   Wt Readings from Last 3 Encounters:  08/16/22 234 lb (106.1 kg)  02/13/22 228 lb (103.4 kg)  09/22/21 230 lb (104.3 kg)   Body mass index is 40.17 kg/m.    Physical Exam Constitutional:      General: She is not in acute distress.    Appearance: Normal appearance.  HENT:     Head: Normocephalic and atraumatic.  Eyes:     Conjunctiva/sclera: Conjunctivae normal.  Cardiovascular:     Rate and Rhythm: Normal rate and regular rhythm.     Heart sounds: Normal heart sounds.  Pulmonary:     Effort: Pulmonary effort is normal. No respiratory distress.     Breath sounds: Normal breath sounds. No wheezing.  Musculoskeletal:     Cervical back: Neck supple.     Right lower leg: No edema.     Left lower leg: No edema.  Lymphadenopathy:     Cervical: No cervical adenopathy.  Skin:    General: Skin is warm and dry.     Findings: No rash.  Neurological:     Mental Status: She is alert. Mental status is at baseline.  Psychiatric:        Mood and Affect: Mood normal.        Behavior: Behavior normal.        Lab Results   Component Value Date   WBC 8.4 08/15/2021   HGB 14.6 08/15/2021   HCT 44.1 08/15/2021   PLT 293.0 08/15/2021   GLUCOSE 79 02/13/2022   CHOL 132 02/13/2022   TRIG 142.0 02/13/2022   HDL 56.10 02/13/2022   LDLDIRECT 156.0 04/21/2019   LDLCALC 48 02/13/2022   ALT 17 02/13/2022   AST 21 02/13/2022   NA 141 02/13/2022   K 4.1 02/13/2022   CL 102 02/13/2022   CREATININE 0.92 02/13/2022   BUN 12 02/13/2022   CO2 33 (H) 02/13/2022   TSH 1.60 10/27/2019   HGBA1C 6.3 02/13/2022     Assessment & Plan:    See Problem List for Assessment and Plan of chronic medical problems.

## 2022-08-16 ENCOUNTER — Ambulatory Visit: Payer: Medicare PPO | Admitting: Internal Medicine

## 2022-08-16 VITALS — BP 126/74 | HR 60 | Temp 98.1°F | Ht 64.0 in | Wt 234.0 lb

## 2022-08-16 DIAGNOSIS — F32A Depression, unspecified: Secondary | ICD-10-CM

## 2022-08-16 DIAGNOSIS — E782 Mixed hyperlipidemia: Secondary | ICD-10-CM | POA: Diagnosis not present

## 2022-08-16 DIAGNOSIS — N3281 Overactive bladder: Secondary | ICD-10-CM

## 2022-08-16 DIAGNOSIS — M26641 Arthritis of right temporomandibular joint: Secondary | ICD-10-CM

## 2022-08-16 DIAGNOSIS — R7303 Prediabetes: Secondary | ICD-10-CM | POA: Diagnosis not present

## 2022-08-16 DIAGNOSIS — R61 Generalized hyperhidrosis: Secondary | ICD-10-CM | POA: Diagnosis not present

## 2022-08-16 DIAGNOSIS — M26649 Arthritis of unspecified temporomandibular joint: Secondary | ICD-10-CM | POA: Insufficient documentation

## 2022-08-16 DIAGNOSIS — F419 Anxiety disorder, unspecified: Secondary | ICD-10-CM

## 2022-08-16 DIAGNOSIS — I10 Essential (primary) hypertension: Secondary | ICD-10-CM | POA: Diagnosis not present

## 2022-08-16 LAB — COMPREHENSIVE METABOLIC PANEL
ALT: 22 U/L (ref 0–35)
AST: 22 U/L (ref 0–37)
Albumin: 4.3 g/dL (ref 3.5–5.2)
Alkaline Phosphatase: 51 U/L (ref 39–117)
BUN: 16 mg/dL (ref 6–23)
CO2: 28 mEq/L (ref 19–32)
Calcium: 10.2 mg/dL (ref 8.4–10.5)
Chloride: 98 mEq/L (ref 96–112)
Creatinine, Ser: 1.01 mg/dL (ref 0.40–1.20)
GFR: 54.53 mL/min — ABNORMAL LOW (ref >60.00)
Glucose, Bld: 84 mg/dL (ref 70–99)
Potassium: 4.1 mEq/L (ref 3.5–5.1)
Sodium: 140 mEq/L (ref 135–145)
Total Bilirubin: 0.5 mg/dL (ref 0.2–1.2)
Total Protein: 7.5 g/dL (ref 6.0–8.3)

## 2022-08-16 LAB — CBC WITH DIFFERENTIAL/PLATELET
Basophils Absolute: 0.1 10*3/uL (ref 0.0–0.1)
Basophils Relative: 0.8 % (ref 0.0–3.0)
Eosinophils Absolute: 0.2 10*3/uL (ref 0.0–0.7)
Eosinophils Relative: 2.1 % (ref 0.0–5.0)
HCT: 44.8 % (ref 36.0–46.0)
Hemoglobin: 14.8 g/dL (ref 12.0–15.0)
Lymphocytes Relative: 26 % (ref 12.0–46.0)
Lymphs Abs: 2 10*3/uL (ref 0.7–4.0)
MCHC: 32.9 g/dL (ref 30.0–36.0)
MCV: 89.7 fl (ref 78.0–100.0)
Monocytes Absolute: 0.6 10*3/uL (ref 0.1–1.0)
Monocytes Relative: 8.3 % (ref 3.0–12.0)
Neutro Abs: 4.8 10*3/uL (ref 1.4–7.7)
Neutrophils Relative %: 62.8 % (ref 43.0–77.0)
Platelets: 271 10*3/uL (ref 150.0–400.0)
RBC: 5 Mil/uL (ref 3.87–5.11)
RDW: 13.9 % (ref 11.5–15.5)
WBC: 7.7 10*3/uL (ref 4.0–10.5)

## 2022-08-16 LAB — LIPID PANEL
Cholesterol: 255 mg/dL — ABNORMAL HIGH (ref 0–200)
HDL: 51.8 mg/dL (ref >39.00)
NonHDL: 202.76
Total CHOL/HDL Ratio: 5
Triglycerides: 242 mg/dL — ABNORMAL HIGH (ref 0.0–149.0)
VLDL: 48.4 mg/dL — ABNORMAL HIGH (ref 0.0–40.0)

## 2022-08-16 LAB — TSH: TSH: 2.29 u[IU]/mL (ref 0.35–5.50)

## 2022-08-16 LAB — HEMOGLOBIN A1C: Hgb A1c MFr Bld: 6.4 % (ref 4.6–6.5)

## 2022-08-16 MED ORDER — CYCLOBENZAPRINE HCL 5 MG PO TABS: 5.0000 mg | ORAL_TABLET | Freq: Every day | ORAL | 0 refills | Status: AC

## 2022-08-16 NOTE — Assessment & Plan Note (Signed)
Chronic Controlled Discussed that the hot flashes/sweats and had could possibly be from the Lexapro No other obvious cause Will check TSH She will try to wean herself off the Lexapro to see if they improve

## 2022-08-16 NOTE — Assessment & Plan Note (Signed)
New Job out of its socket, but is back in She did see her dentist and said everything looked okay Still having right-sided TMJ joint discomfort, especially with eating.  Also having headaches Trial of Flexeril 5 mg at night to see if that helps Discussed that she can try heat, ice, Voltaren gel Can take a couple Advil here and there if needed

## 2022-08-16 NOTE — Assessment & Plan Note (Signed)
Chronic Blood pressure well controlled CMP Continue hydrochlorothiazide 25 mg daily 

## 2022-08-16 NOTE — Assessment & Plan Note (Signed)
Chronic Regular exercise and healthy diet encouraged Check lipid panel  Was having muscle aches and stopped Crestor 2 weeks ago Still having some muscle aches-advised to stay off of it for at least another 2 weeks to see if that helps-depending on results could restart or consider restarting a lower dose Could consider Zetia or Repatha Following with Dr. Rennis Golden

## 2022-08-16 NOTE — Assessment & Plan Note (Signed)
Chronic Check a1c Low sugar / carb diet Stressed regular exercise  

## 2022-08-16 NOTE — Assessment & Plan Note (Signed)
New For a while she has been experiencing these hot flashes and head sweats Occur with activity and at rest ?  Related to Lexapro Will check TSH She will try weaning herself off the medication to see if that corrects the problem

## 2022-08-16 NOTE — Assessment & Plan Note (Signed)
Chronic Improved with medication Continue Myrbetriq 50 mg daily 

## 2022-08-17 LAB — LDL CHOLESTEROL, DIRECT: Direct LDL: 183 mg/dL

## 2022-09-08 ENCOUNTER — Telehealth: Payer: Self-pay | Admitting: Internal Medicine

## 2022-09-08 MED ORDER — MIRABEGRON ER 50 MG PO TB24: 50.0000 mg | ORAL_TABLET | Freq: Every day | ORAL | 1 refills | Status: DC

## 2022-09-08 NOTE — Telephone Encounter (Signed)
Sent refill to [facility]Raechel Chute

## 2022-09-08 NOTE — Telephone Encounter (Signed)
Caller & Relationship to patient: Pharmacy    Call back number: [phone]   Date of last office visit: 6.5.24  Date of next office visit: 12.18.24  Medication(s) to be refilled:  MYRBETRIQ 50 MG TB24 tablet   Preferred Pharmacy:   [facility] - [place], [place] - [address]

## 2022-12-01 DIAGNOSIS — L820 Inflamed seborrheic keratosis: Secondary | ICD-10-CM | POA: Diagnosis not present

## 2022-12-01 DIAGNOSIS — L298 Other pruritus: Secondary | ICD-10-CM | POA: Diagnosis not present

## 2022-12-01 DIAGNOSIS — R238 Other skin changes: Secondary | ICD-10-CM | POA: Diagnosis not present

## 2022-12-01 DIAGNOSIS — B078 Other viral warts: Secondary | ICD-10-CM | POA: Diagnosis not present

## 2022-12-01 DIAGNOSIS — L538 Other specified erythematous conditions: Secondary | ICD-10-CM | POA: Diagnosis not present

## 2022-12-06 DIAGNOSIS — H04123 Dry eye syndrome of bilateral lacrimal glands: Secondary | ICD-10-CM | POA: Diagnosis not present

## 2022-12-06 DIAGNOSIS — H26493 Other secondary cataract, bilateral: Secondary | ICD-10-CM | POA: Diagnosis not present

## 2023-02-14 ENCOUNTER — Encounter: Payer: Self-pay | Admitting: Internal Medicine

## 2023-02-19 ENCOUNTER — Encounter: Payer: Self-pay | Admitting: Internal Medicine

## 2023-02-19 ENCOUNTER — Ambulatory Visit: Payer: Medicare PPO | Admitting: Internal Medicine

## 2023-02-19 VITALS — BP 126/72 | HR 79 | Temp 98.2°F | Ht 64.0 in | Wt 227.0 lb

## 2023-02-19 DIAGNOSIS — J0100 Acute maxillary sinusitis, unspecified: Secondary | ICD-10-CM

## 2023-02-19 DIAGNOSIS — I10 Essential (primary) hypertension: Secondary | ICD-10-CM | POA: Diagnosis not present

## 2023-02-19 MED ORDER — CEFDINIR 300 MG PO CAPS: 300.0000 mg | ORAL_CAPSULE | Freq: Two times a day (BID) | ORAL | 0 refills | Status: DC

## 2023-02-19 MED ORDER — ALBUTEROL SULFATE HFA 108 (90 BASE) MCG/ACT IN AERS: 2.0000 | INHALATION_SPRAY | RESPIRATORY_TRACT | 6 refills | Status: AC | PRN

## 2023-02-19 MED ORDER — HYDROCODONE BIT-HOMATROP MBR 5-1.5 MG/5ML PO SOLN: 5.0000 mL | Freq: Three times a day (TID) | ORAL | 0 refills | Status: DC | PRN

## 2023-02-19 MED ORDER — FLUTICASONE PROPIONATE 50 MCG/ACT NA SUSP: NASAL | 3 refills | Status: DC

## 2023-02-19 NOTE — Patient Instructions (Addendum)
        Medications changes include :   cefdinir, syrup and flonase      Return if symptoms worsen or fail to improve.

## 2023-02-19 NOTE — Assessment & Plan Note (Signed)
Chronic Blood pressure well controlled Continue hydrochlorothiazide 25 mg daily

## 2023-02-19 NOTE — Assessment & Plan Note (Addendum)
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

## 2023-02-19 NOTE — Progress Notes (Signed)
Subjective:    Patient ID: Kayla Maddox, female    DOB: 1945-03-29, 77 y.o.   MRN: [id number]      HPI Kayla Maddox is here for  Chief Complaint  Patient presents with   Cough    Cough and congestion; thick and white mucus; Throat feels like sandpaper; Post nasal drainage    10 days of cold symtpoms-possible sinus infection.  Her symptoms are not improving.  She states nasal congestion, PND, sinus pressure, intermittent sore throat, cough and headaches.   Used albuterol a few times - it has helped.  mucinex, tylenol , robitussion, vicks   Medications and allergies reviewed with patient and updated if appropriate.  Current Outpatient Medications on File Prior to Visit  Medication Sig Dispense Refill   albuterol (VENTOLIN HFA) 108 (90 Base) MCG/ACT inhaler Inhale 2 puffs into the lungs every 4 (four) hours as needed for wheezing or shortness of breath. 8 g 6   ALPRAZolam (XANAX) 0.25 MG tablet Take 1 tablet (0.25 mg total) by mouth 2 (two) times daily as needed for anxiety. 60 tablet 5   Ascorbic Acid (VITAMIN C) 1000 MG tablet Take 1,000 mg by mouth daily.     benzonatate (TESSALON) 200 MG capsule TAKE ONE CAPSULE BY MOUTH THREE TIMES DAILY AS NEEDED FOR COUGH 90 capsule 0   Budeson-Glycopyrrol-Formoterol (BREZTRI AEROSPHERE) 160-9-4.8 MCG/ACT AERO INHALE 2 PUFFS TWICE DAILY IN THE MORNING & AT BEDTIME 32.1 g 2   Calcium Carbonate (CALCIUM 500 PO) Take by mouth.     Coenzyme Q10 (CO Q-10) 200 MG CAPS Take 200 mg by mouth.     cyclobenzaprine (FLEXERIL) 5 MG tablet Take 1 tablet (5 mg total) by mouth at bedtime. 30 tablet 0   escitalopram (LEXAPRO) 20 MG tablet TAKE ONE TABLET BY MOUTH DAILY FOR DEPRESSION 90 tablet 2   hydrochlorothiazide (HYDRODIURIL) 25 MG tablet TAKE ONE TABLET BY MOUTH EVERY DAY 90 tablet 3   mesalamine (APRISO) 0.375 g 24 hr capsule TAKE 4 CAPSULES EVERY DAY 120 capsule 2   mirabegron ER (MYRBETRIQ) 50 MG TB24 tablet Take 1 tablet (50 mg total) by mouth daily. 90  tablet 1   mometasone (NASONEX) 50 MCG/ACT nasal spray Place 2 sprays into the nose daily. 1 each 12   vitamin B-12 (CYANOCOBALAMIN) 1000 MCG tablet Take 1 tablet (1,000 mcg total) by mouth daily.     rosuvastatin (CRESTOR) 20 MG tablet Take 1 tablet (20 mg total) by mouth daily. 90 tablet 3   No current facility-administered medications on file prior to visit.    Review of Systems  Constitutional:  Negative for fever.  HENT:  Positive for congestion, postnasal drip, sinus pressure and sore throat (intermittent). Negative for ear pain.   Respiratory:  Positive for cough. Negative for shortness of breath and wheezing.   Neurological:  Positive for headaches. Negative for dizziness (minimal).       Objective:   Vitals:   02/19/23 1618  BP: 126/72  Pulse: 79  Temp: 98.2 F (36.8 C)  SpO2: 97%   BP Readings from Last 3 Encounters:  02/19/23 126/72  08/16/22 126/74  02/13/22 126/74   Wt Readings from Last 3 Encounters:  02/19/23 227 lb (103 kg)  08/16/22 234 lb (106.1 kg)  02/13/22 228 lb (103.4 kg)   Body mass index is 38.96 kg/m.    Physical Exam Constitutional:      General: She is not in acute distress.    Appearance: Normal appearance. She  is not ill-appearing.  HENT:     Head: Normocephalic and atraumatic.     Right Ear: Tympanic membrane, ear canal and external ear normal.     Left Ear: Tympanic membrane, ear canal and external ear normal.     Mouth/Throat:     Mouth: Mucous membranes are moist.     Pharynx: No oropharyngeal exudate or posterior oropharyngeal erythema.  Eyes:     Conjunctiva/sclera: Conjunctivae normal.  Cardiovascular:     Rate and Rhythm: Normal rate and regular rhythm.  Pulmonary:     Effort: Pulmonary effort is normal. No respiratory distress.     Breath sounds: Normal breath sounds. No wheezing or rales.  Musculoskeletal:     Cervical back: Neck supple. No tenderness.  Lymphadenopathy:     Cervical: No cervical adenopathy.  Skin:     General: Skin is warm and dry.  Neurological:     Mental Status: She is alert.            Assessment & Plan:    See Problem List for Assessment and Plan of chronic medical problems.

## 2023-02-23 ENCOUNTER — Ambulatory Visit (INDEPENDENT_AMBULATORY_CARE_PROVIDER_SITE_OTHER): Payer: Medicare PPO

## 2023-02-23 VITALS — Ht 64.0 in | Wt 227.0 lb

## 2023-02-23 DIAGNOSIS — Z0000 Encounter for general adult medical examination without abnormal findings: Secondary | ICD-10-CM

## 2023-02-23 NOTE — Patient Instructions (Signed)
Kayla Maddox , Thank you for taking time to come for your Medicare Wellness Visit. I appreciate your ongoing commitment to your health goals. Please review the following plan we discussed and let me know if I can assist you in the future.   Referrals/Orders/Follow-Ups/Clinician Recommendations: Altamese Cabal Christmas  This is a list of the screening recommended for you and due dates:  Health Maintenance  Topic Date Due   COVID-19 Vaccine (6 - 2024-25 season) 03/11/2023*   Colon Cancer Screening  07/09/2023   Medicare Annual Wellness Visit  02/23/2024   Mammogram  04/17/2024   DEXA scan (bone density measurement)  05/05/2025   DTaP/Tdap/Td vaccine (3 - Td or Tdap) 12/07/2031   Pneumonia Vaccine  Completed   Flu Shot  Completed   Hepatitis C Screening  Completed   Zoster (Shingles) Vaccine  Completed   HPV Vaccine  Aged Out  *Topic was postponed. The date shown is not the original due date.    Advanced directives: (Copy Requested) Please bring a copy of your health care power of attorney and living will to the office to be added to your chart at your convenience.  Next Medicare Annual Wellness Visit scheduled for next year: Yes

## 2023-02-23 NOTE — Progress Notes (Signed)
Subjective:   Kayla Maddox is a 77 y.o. female who presents for Medicare Annual (Subsequent) preventive examination.  Visit Complete: Virtual I connected with  Kayla Maddox on 02/23/23 by a audio enabled telemedicine application and verified that I am speaking with the correct person using two identifiers.  Patient Location: Home  Provider Location: Office/Clinic  I discussed the limitations of evaluation and management by telemedicine. The patient expressed understanding and agreed to proceed.  Vital Signs: Because this visit was a virtual/telehealth visit, some criteria may be missing or patient reported. Any vitals not documented were not able to be obtained and vitals that have been documented are patient reported.   Cardiac Risk Factors include: advanced age (>17men, >89 women);hypertension;Other (see comment);dyslipidemia, Risk factor comments: [place] Atherosclerosis, OSA, COPD, Crohn's colitis, Osteopenia     Objective:    Today's Vitals   02/23/23 1532  Weight: 227 lb (103 kg)  Height: 5\' 4"  (1.626 m)   Body mass index is 38.96 kg/m.     02/23/2023    3:36 PM 01/25/2022   10:50 AM 01/24/2021    1:06 PM 01/22/2020   11:33 AM 01/16/2019   11:06 AM 11/29/2017   11:18 AM 11/27/2016    3:31 PM  Advanced Directives  Does Patient Have a Medical Advance Directive? Yes Yes Yes Yes Yes Yes Yes  Type of Estate agent of [place];Living will Healthcare Power of [place];Living will Living will;Healthcare Power of Attorney Living will;Healthcare Power of [employer] Power of [place];Living will Healthcare Power of [place];Living will Healthcare Power of [place];Living will  Does patient want to make changes to medical advance directive?   No - Patient declined No - Patient declined     Copy of Healthcare Power of Attorney in Chart? No - copy requested No - copy requested No - copy requested No - copy requested No - copy requested No - copy requested  No - copy requested    Current Medications (verified) Outpatient Encounter Medications as of 02/23/2023  Medication Sig   albuterol (VENTOLIN HFA) 108 (90 Base) MCG/ACT inhaler Inhale 2 puffs into the lungs every 4 (four) hours as needed for wheezing or shortness of breath.   ALPRAZolam (XANAX) 0.25 MG tablet Take 1 tablet (0.25 mg total) by mouth 2 (two) times daily as needed for anxiety.   Ascorbic Acid (VITAMIN C) 1000 MG tablet Take 1,000 mg by mouth daily.   benzonatate (TESSALON) 200 MG capsule TAKE ONE CAPSULE BY MOUTH THREE TIMES DAILY AS NEEDED FOR COUGH   Budeson-Glycopyrrol-Formoterol (BREZTRI AEROSPHERE) 160-9-4.8 MCG/ACT AERO INHALE 2 PUFFS TWICE DAILY IN THE MORNING & AT BEDTIME   Calcium Carbonate (CALCIUM 500 PO) Take by mouth.   cefdinir (OMNICEF) 300 MG capsule Take 1 capsule (300 mg total) by mouth 2 (two) times daily.   Coenzyme Q10 (CO Q-10) 200 MG CAPS Take 200 mg by mouth.   cyclobenzaprine (FLEXERIL) 5 MG tablet Take 1 tablet (5 mg total) by mouth at bedtime.   escitalopram (LEXAPRO) 20 MG tablet TAKE ONE TABLET BY MOUTH DAILY FOR DEPRESSION   fluticasone (FLONASE) 50 MCG/ACT nasal spray INHALE 2 SPRAYS IN EACH NOSTRIL EVERY DAY   hydrochlorothiazide (HYDRODIURIL) 25 MG tablet TAKE ONE TABLET BY MOUTH EVERY DAY   HYDROcodone bit-homatropine (HYCODAN) 5-1.5 MG/5ML syrup Take 5 mLs by mouth every 8 (eight) hours as needed for cough.   mesalamine (APRISO) 0.375 g 24 hr capsule TAKE 4 CAPSULES EVERY DAY   mirabegron ER (MYRBETRIQ) 50  MG TB24 tablet Take 1 tablet (50 mg total) by mouth daily.   mometasone (NASONEX) 50 MCG/ACT nasal spray Place 2 sprays into the nose daily.   vitamin B-12 (CYANOCOBALAMIN) 1000 MCG tablet Take 1 tablet (1,000 mcg total) by mouth daily.   rosuvastatin (CRESTOR) 20 MG tablet Take 1 tablet (20 mg total) by mouth daily.   No facility-administered encounter medications on file as of 02/23/2023.    Allergies (verified) Simvastatin,  Amoxicillin, Clarithromycin, Doxycycline, Prednisone, and Sulfonamide derivatives   History: Past Medical History:  Diagnosis Date   Allergic rhinitis    Allergy    Anxiety    Arthritis    Cancer (HCC)    skin cancer    Cataract    bilat removed    Chronic colitis    Crohn's colitis (HCC)    Diverticulosis    Fundic gland polyps of stomach, benign    Gallbladder polyp 2017   Gastric heterotopia    GERD (gastroesophageal reflux disease)    Hyperlipidemia    Hyperplastic colon polyp    Hypertension    Internal hemorrhoids    Obesity    Serrated adenoma of colon    Sleep apnea    does not use CPAP   Tubular adenoma of colon    Past Surgical History:  Procedure Laterality Date   BLADDER SUSPENSION  2011   cataract surgery Bilateral 2017   COLONOSCOPY  08/22/2016   Pyrtle   FOOT SURGERY Left 2005   NASAL SINUS SURGERY  2012   POLYPECTOMY     SPINE SURGERY  1994   UPPER GASTROINTESTINAL ENDOSCOPY  2014   Family History  Problem Relation Age of Onset   Diabetes Father    Hypertension Father    Heart disease Father    Hypertension Mother    Heart disease Mother    Colon cancer Neg Hx    Esophageal cancer Neg Hx    Rectal cancer Neg Hx    Stomach cancer Neg Hx    Colon polyps Neg Hx    Social History   Socioeconomic History   Marital status: Married    Spouse name: Kayla Maddox   Number of children: 1   Years of education: Not on file   Highest education level: Not on file  Occupational History   Occupation: office clerk/ retired    Associate Professor: [employer]  Tobacco Use   Smoking status: Former    Current packs/day: 0.00    Average packs/day: 1 pack/day for 20.0 years (20.0 ttl pk-yrs)    Types: Cigarettes    Start date: 03/13/1961    Quit date: 03/13/1981    Years since quitting: 41.9   Smokeless tobacco: Never  Vaping Use   Vaping status: Never Used  Substance and Sexual Activity   Alcohol use: No   Drug use: No   Sexual activity: Not Currently  Other Topics  Concern   Not on file  Social History Narrative   No exercise      Retiring in June 2017   Lives with husband   Social Drivers of Corporate investment banker Strain: Low Risk  (02/23/2023)   Overall Financial Resource Strain (CARDIA)    Difficulty of Paying Living Expenses: Not hard at all  Food Insecurity: No Food Insecurity (02/23/2023)   Hunger Vital Sign    Worried About Running Out of Food in the Last Year: Never true    Ran Out of Food in the Last Year: Never true  Transportation Needs: No Transportation Needs (02/23/2023)   PRAPARE - Administrator, Civil Service (Medical): No    Lack of Transportation (Non-Medical): No  Physical Activity: Inactive (02/23/2023)   Exercise Vital Sign    Days of Exercise per Week: 0 days    Minutes of Exercise per Session: 0 min  Stress: No Stress Concern Present (02/23/2023)   [employer] of Occupational Health - Occupational Stress Questionnaire    Feeling of Stress : Not at all  Social Connections: Socially Integrated (02/23/2023)   Social Connection and Isolation Panel [NHANES]    Frequency of Communication with Friends and Family: Twice a week    Frequency of Social Gatherings with Friends and Family: Twice a week    Attends Religious Services: More than 4 times per year    Active Member of [employer] or Organizations: Yes    Attends Banker Meetings: Never    Marital Status: Married    Tobacco Counseling Counseling given: Not Answered   Clinical Intake:  Pre-visit preparation completed: Yes  Pain : No/denies pain     BMI - recorded: 38.96 Nutritional Status: BMI > 30  Obese Nutritional Risks: None Diabetes: No  How often do you need to have someone help you when you read instructions, pamphlets, or other written materials from your doctor or pharmacy?: 1 - Never  Interpreter Needed?: No  Information entered by :: Wendall Stade, RMA   Activities of Daily Living    02/23/2023    3:35 PM   In your present state of health, do you have any difficulty performing the following activities:  Hearing? 1  Comment hearing aides  Vision? 0  Difficulty concentrating or making decisions? 0  Walking or climbing stairs? 0  Dressing or bathing? 0  Doing errands, shopping? 0  Preparing Food and eating ? N  Using the Toilet? N  In the past six months, have you accidently leaked urine? Y  Do you have problems with loss of bowel control? N  Managing your Medications? N  Managing your Finances? N  Housekeeping or managing your Housekeeping? N    Patient Care Team: Pincus Sanes, MD as PCP - General (Internal Medicine) Rennis Golden Lisette Abu, MD as PCP - Cardiology (Cardiology) Pyrtle, Carie Caddy, MD as Consulting Physician (Gastroenterology) Barnie Alderman, MD as Referring Physician (Otolaryngology) Maxie Better, MD as Consulting Physician (Obstetrics and Gynecology) Alfredo Martinez, MD as Consulting Physician (Urology) Rennis Golden Lisette Abu, MD as Consulting Physician (Cardiology) Yvette Rack, OD as Consulting Physician (Optometry)  Indicate any recent Medical Services you may have received from other than [facility] providers in the past year (date may be approximate).     Assessment:   This is a routine wellness examination for Kareena.  Hearing/Vision screen Hearing Screening - Comments:: Wears hearing aides   Goals Addressed             This Visit's Progress    Patient Stated   On track    Maintain current health status. Enjoy life and family.      Depression Screen    02/23/2023    3:40 PM 02/13/2022    2:02 PM 01/25/2022   10:53 AM 08/15/2021    3:12 PM 01/24/2021    1:12 PM 01/22/2020   11:32 AM 10/28/2019    2:32 PM  PHQ 2/9 Scores  PHQ - 2 Score 0 0 0 2 0 0 0  PHQ- 9 Score 6 3  6    2  Fall Risk    02/23/2023    3:37 PM 02/13/2022    2:02 PM 01/25/2022   10:51 AM 08/15/2021    3:13 PM 01/24/2021    1:07 PM  Fall Risk   Falls in the past year? 0 0 0 0  0  Number falls in past yr: 0 0 0 0 0  Injury with Fall? 0 0 0 0 0  Risk for fall due to : No Fall Risks No Fall Risks No Fall Risks No Fall Risks No Fall Risks  Follow up Falls prevention discussed;Falls evaluation completed Falls evaluation completed Falls prevention discussed Falls evaluation completed Falls evaluation completed    MEDICARE RISK AT HOME: Medicare Risk at Home Any stairs in or around the home?: Yes (2 steps in the house) If so, are there any without handrails?: Yes Home free of loose throw rugs in walkways, pet beds, electrical cords, etc?: Yes Adequate lighting in your home to reduce risk of falls?: Yes Life alert?: No Use of a cane, walker or w/c?: No Grab bars in the bathroom?: Yes Shower chair or bench in shower?: No Elevated toilet seat or a handicapped toilet?: No  TIMED UP AND GO:  Was the test performed?  No    Cognitive Function:    11/27/2016    3:33 PM  MMSE - Mini Mental State Exam  Orientation to time 5  Orientation to Place 5  Registration 3  Attention/ Calculation 5  Recall 3  Language- name 2 objects 2  Language- repeat 1  Language- follow 3 step command 3  Language- read & follow direction 1  Write a sentence 1  Copy design 1  Total score 30        02/23/2023    3:59 PM 01/25/2022   10:56 AM  6CIT Screen  What Year? 0 points 0 points  What month? 0 points 0 points  What time? 0 points 0 points  Count back from 20 0 points 0 points  Months in reverse 0 points 0 points  Repeat phrase 0 points 0 points  Total Score 0 points 0 points    Immunizations Immunization History  Administered Date(s) Administered   Fluad Quad(high Dose 65+) 12/06/2018, 12/17/2020, 11/24/2021, 12/08/2022   Influenza Split 12/11/2012   Influenza Whole 01/17/2010   Influenza, High Dose Seasonal PF 11/27/2016, 11/29/2017   Influenza,inj,Quad PF,6+ Mos 01/10/2014   Influenza-Unspecified 12/12/2014, 12/08/2019   PFIZER(Purple Top)SARS-COV-2 Vaccination  04/11/2019, 05/06/2019, 01/26/2020   Pfizer Covid-19 Vaccine Bivalent Booster 88yrs & up 12/17/2020   Pfizer(Comirnaty)Fall Seasonal Vaccine 12 years and older 01/05/2022   Pneumococcal Conjugate-13 10/28/2013   Pneumococcal Polysaccharide-23 01/11/2010, 05/04/2015   Tdap 10/28/2013, 12/06/2021   Zoster Recombinant(Shingrix) 11/29/2021, 02/09/2022   Zoster, Live 03/14/2011    TDAP status: Up to date  Flu Vaccine status: Up to date  Pneumococcal vaccine status: Up to date  Covid-19 vaccine status: Declined, Education has been provided regarding the importance of this vaccine but patient still declined. Advised may receive this vaccine at local pharmacy or Health Dept.or vaccine clinic. Aware to provide a copy of the vaccination record if obtained from local pharmacy or Health Dept. Verbalized acceptance and understanding.  Qualifies for Shingles Vaccine? Yes   Zostavax completed Yes   Shingrix Completed?: Yes  Screening Tests Health Maintenance  Topic Date Due   COVID-19 Vaccine (6 - 2024-25 season) 03/11/2023 (Originally 11/12/2022)   Colonoscopy  07/09/2023   Medicare Annual Wellness (AWV)  02/23/2024   MAMMOGRAM  04/17/2024  DEXA SCAN  05/05/2025   DTaP/Tdap/Td (3 - Td or Tdap) 12/07/2031   Pneumonia Vaccine 47+ Years old  Completed   INFLUENZA VACCINE  Completed   Hepatitis C Screening  Completed   Zoster Vaccines- Shingrix  Completed   HPV VACCINES  Aged Out    Health Maintenance  There are no preventive care reminders to display for this patient.   Colorectal cancer screening: Type of screening: Colonoscopy. Completed 07/08/2020. Repeat every 3 years  Mammogram status: Completed 04/17/2022. Repeat every year  Bone Density status: Completed 05/05/2022. Results reflect: Bone density results: OSTEOPENIA. Repeat every 2 years.  Lung Cancer Screening: (Low Dose CT Chest recommended if Age 69-80 years, 20 pack-year currently smoking OR have quit w/in 15years.) does not  qualify.   Lung Cancer Screening Referral: N/A  Additional Screening:  Hepatitis C Screening: does qualify; Completed 05/04/2015  Vision Screening: Recommended annual ophthalmology exams for early detection of glaucoma and other disorders of the eye. Is the patient up to date with their annual eye exam?  Yes  Who is the provider or what is the name of the office in which the patient attends annual eye exams? Dr. Leron Croak If pt is not established with a provider, would they like to be referred to a provider to establish care? Yes .   Dental Screening: Recommended annual dental exams for proper oral hygiene   Community Resource Referral / Chronic Care Management: CRR required this visit?  No   CCM required this visit?  No     Plan:     I have personally reviewed and noted the following in the patient's chart:   Medical and social history Use of alcohol, tobacco or illicit drugs  Current medications and supplements including opioid prescriptions. Patient is not currently taking opioid prescriptions. Functional ability and status Nutritional status Physical activity Advanced directives List of other physicians Hospitalizations, surgeries, and ER visits in previous 12 months Vitals Screenings to include cognitive, depression, and falls Referrals and appointments  In addition, I have reviewed and discussed with patient certain preventive protocols, quality metrics, and best practice recommendations. A written personalized care plan for preventive services as well as general preventive health recommendations were provided to patient.     Loyce Klasen L Lennex Pietila, CMA   02/23/2023   After Visit Summary: (MyChart) Due to this being a telephonic visit, the after visit summary with patients personalized plan was offered to patient via MyChart   Nurse Notes: Patient is up to date on all health maintenance.  She had no concerns to address today.

## 2023-02-25 ENCOUNTER — Encounter: Payer: Self-pay | Admitting: Internal Medicine

## 2023-02-27 ENCOUNTER — Encounter: Payer: Self-pay | Admitting: Internal Medicine

## 2023-02-27 NOTE — Progress Notes (Unsigned)
Subjective:    Patient ID: Kayla Maddox, female    DOB: 1945/09/05, 77 y.o.   MRN: [id number]      HPI Kayla Maddox is here for a Physical exam and her chronic medical problems.   Muscles are not there any more -- hard time standing up straight and has decreased strength.   Concerned about weight  - abdomen region is getting larger.    Medications and allergies reviewed with patient and updated if appropriate.  Current Outpatient Medications on File Prior to Visit  Medication Sig Dispense Refill   albuterol (VENTOLIN HFA) 108 (90 Base) MCG/ACT inhaler Inhale 2 puffs into the lungs every 4 (four) hours as needed for wheezing or shortness of breath. 8 g 6   ALPRAZolam (XANAX) 0.25 MG tablet Take 1 tablet (0.25 mg total) by mouth 2 (two) times daily as needed for anxiety. 60 tablet 5   Ascorbic Acid (VITAMIN C) 1000 MG tablet Take 1,000 mg by mouth daily.     benzonatate (TESSALON) 200 MG capsule TAKE ONE CAPSULE BY MOUTH THREE TIMES DAILY AS NEEDED FOR COUGH 90 capsule 0   Budeson-Glycopyrrol-Formoterol (BREZTRI AEROSPHERE) 160-9-4.8 MCG/ACT AERO INHALE 2 PUFFS TWICE DAILY IN THE MORNING & AT BEDTIME 32.1 g 2   Calcium Carbonate (CALCIUM 500 PO) Take by mouth.     cefdinir (OMNICEF) 300 MG capsule Take 1 capsule (300 mg total) by mouth 2 (two) times daily. 20 capsule 0   Coenzyme Q10 (CO Q-10) 200 MG CAPS Take 200 mg by mouth.     cyclobenzaprine (FLEXERIL) 5 MG tablet Take 1 tablet (5 mg total) by mouth at bedtime. 30 tablet 0   fluticasone (FLONASE) 50 MCG/ACT nasal spray INHALE 2 SPRAYS IN EACH NOSTRIL EVERY DAY 16 g 3   HYDROcodone bit-homatropine (HYCODAN) 5-1.5 MG/5ML syrup Take 5 mLs by mouth every 8 (eight) hours as needed for cough. 120 mL 0   mesalamine (APRISO) 0.375 g 24 hr capsule TAKE 4 CAPSULES EVERY DAY 120 capsule 2   mirabegron ER (MYRBETRIQ) 50 MG TB24 tablet Take 1 tablet (50 mg total) by mouth daily. 90 tablet 1   mometasone (NASONEX) 50 MCG/ACT nasal spray Place 2  sprays into the nose daily. 1 each 12   vitamin B-12 (CYANOCOBALAMIN) 1000 MCG tablet Take 1 tablet (1,000 mcg total) by mouth daily.     rosuvastatin (CRESTOR) 20 MG tablet Take 1 tablet (20 mg total) by mouth daily. 90 tablet 3   No current facility-administered medications on file prior to visit.    Review of Systems  Constitutional:  Negative for fever.  Eyes:  Negative for visual disturbance.  Respiratory:  Positive for cough (improved - a little - day and productive), shortness of breath (improved) and wheezing (better - mild).   Cardiovascular:  Negative for chest pain, palpitations and leg swelling.  Gastrointestinal:  Negative for abdominal pain, blood in stool, constipation and diarrhea.       No gerd  Genitourinary:  Negative for dysuria.  Musculoskeletal:  Positive for arthralgias (left thumb). Negative for back pain.  Skin:  Negative for rash.  Neurological:  Positive for light-headedness (mild) and headaches (from recently uri).  Psychiatric/Behavioral:  Negative for dysphoric mood and sleep disturbance. The patient is not nervous/anxious.        Objective:   Vitals:   02/28/23 1301  BP: 126/72  Pulse: 65  Temp: 98.4 F (36.9 C)  SpO2: 99%   Filed Weights   02/28/23 1301  Weight: 227 lb (103 kg)   Body mass index is 38.96 kg/m.  BP Readings from Last 3 Encounters:  02/28/23 126/72  02/19/23 126/72  08/16/22 126/74    Wt Readings from Last 3 Encounters:  02/28/23 227 lb (103 kg)  02/23/23 227 lb (103 kg)  02/19/23 227 lb (103 kg)       Physical Exam Constitutional: She appears well-developed and well-nourished. No distress.  HENT:  Head: Normocephalic and atraumatic.  Right Ear: External ear normal. Normal ear canal and TM Left Ear: External ear normal.  Normal ear canal and TM Mouth/Throat: Oropharynx is clear and moist.  Eyes: Conjunctivae normal.  Neck: Neck supple. No tracheal deviation present. No thyromegaly present.  No carotid bruit   Cardiovascular: Normal rate, regular rhythm and normal heart sounds.   No murmur heard.  No edema. Pulmonary/Chest: Effort normal and breath sounds normal. No respiratory distress. She has no wheezes. She has no rales.  Breast: deferred   Abdominal: Soft. She exhibits no distension. There is no tenderness.  Lymphadenopathy: She has no cervical adenopathy.  Skin: Skin is warm and dry. She is not diaphoretic.  Psychiatric: She has a normal mood and affect. Her behavior is normal.     Lab Results  Component Value Date   WBC 7.7 08/16/2022   HGB 14.8 08/16/2022   HCT 44.8 08/16/2022   PLT 271.0 08/16/2022   GLUCOSE 84 08/16/2022   CHOL 255 (H) 08/16/2022   TRIG 242.0 (H) 08/16/2022   HDL 51.80 08/16/2022   LDLDIRECT 183.0 08/16/2022   LDLCALC 48 02/13/2022   ALT 22 08/16/2022   AST 22 08/16/2022   NA 140 08/16/2022   K 4.1 08/16/2022   CL 98 08/16/2022   CREATININE 1.01 08/16/2022   BUN 16 08/16/2022   CO2 28 08/16/2022   TSH 2.29 08/16/2022   HGBA1C 6.4 08/16/2022         Assessment & Plan:   Physical exam: Screening blood work  ordered Exercise  none - start light weights while watching tv, move more, consider getting ellipta Weight  obese - encouraged weight loss Substance abuse  none   Reviewed recommended immunizations.   Health Maintenance  Topic Date Due   COVID-19 Vaccine (6 - 2024-25 season) 03/11/2023 (Originally 11/12/2022)   Colonoscopy  07/09/2023   Medicare Annual Wellness (AWV)  02/23/2024   MAMMOGRAM  04/17/2024   DEXA SCAN  05/05/2025   DTaP/Tdap/Td (3 - Td or Tdap) 12/07/2031   Pneumonia Vaccine 79+ Years old  Completed   INFLUENZA VACCINE  Completed   Hepatitis C Screening  Completed   Zoster Vaccines- Shingrix  Completed   HPV VACCINES  Aged Out          See Problem List for Assessment and Plan of chronic medical problems.

## 2023-02-27 NOTE — Patient Instructions (Addendum)
      Blood work was ordered.       Medications changes include :   None    A referral was ordered and someone will call you to schedule an appointment.     Return in about 6 months (around 08/29/2023) for follow up.

## 2023-02-28 ENCOUNTER — Ambulatory Visit: Payer: Medicare PPO | Admitting: Internal Medicine

## 2023-02-28 ENCOUNTER — Encounter: Payer: Self-pay | Admitting: Internal Medicine

## 2023-02-28 VITALS — BP 126/72 | HR 65 | Temp 98.4°F | Ht 64.0 in | Wt 227.0 lb

## 2023-02-28 DIAGNOSIS — Z6838 Body mass index (BMI) 38.0-38.9, adult: Secondary | ICD-10-CM | POA: Diagnosis not present

## 2023-02-28 DIAGNOSIS — K5010 Crohn's disease of large intestine without complications: Secondary | ICD-10-CM

## 2023-02-28 DIAGNOSIS — E538 Deficiency of other specified B group vitamins: Secondary | ICD-10-CM | POA: Diagnosis not present

## 2023-02-28 DIAGNOSIS — E559 Vitamin D deficiency, unspecified: Secondary | ICD-10-CM

## 2023-02-28 DIAGNOSIS — N3281 Overactive bladder: Secondary | ICD-10-CM

## 2023-02-28 DIAGNOSIS — Z0000 Encounter for general adult medical examination without abnormal findings: Secondary | ICD-10-CM | POA: Diagnosis not present

## 2023-02-28 DIAGNOSIS — F419 Anxiety disorder, unspecified: Secondary | ICD-10-CM

## 2023-02-28 DIAGNOSIS — R7303 Prediabetes: Secondary | ICD-10-CM

## 2023-02-28 DIAGNOSIS — F32A Depression, unspecified: Secondary | ICD-10-CM

## 2023-02-28 DIAGNOSIS — E782 Mixed hyperlipidemia: Secondary | ICD-10-CM | POA: Diagnosis not present

## 2023-02-28 DIAGNOSIS — I10 Essential (primary) hypertension: Secondary | ICD-10-CM

## 2023-02-28 DIAGNOSIS — M85852 Other specified disorders of bone density and structure, left thigh: Secondary | ICD-10-CM

## 2023-02-28 DIAGNOSIS — J4489 Other specified chronic obstructive pulmonary disease: Secondary | ICD-10-CM | POA: Diagnosis not present

## 2023-02-28 DIAGNOSIS — I700 Atherosclerosis of aorta: Secondary | ICD-10-CM

## 2023-02-28 LAB — CBC WITH DIFFERENTIAL/PLATELET
Basophils Absolute: 0.1 10*3/uL (ref 0.0–0.1)
Basophils Relative: 1.1 % (ref 0.0–3.0)
Eosinophils Absolute: 0.2 10*3/uL (ref 0.0–0.7)
Eosinophils Relative: 1.8 % (ref 0.0–5.0)
HCT: 44.2 % (ref 36.0–46.0)
Hemoglobin: 14.9 g/dL (ref 12.0–15.0)
Lymphocytes Relative: 22.9 % (ref 12.0–46.0)
Lymphs Abs: 2.1 10*3/uL (ref 0.7–4.0)
MCHC: 33.7 g/dL (ref 30.0–36.0)
MCV: 88.4 fL (ref 78.0–100.0)
Monocytes Absolute: 0.5 10*3/uL (ref 0.1–1.0)
Monocytes Relative: 5.8 % (ref 3.0–12.0)
Neutro Abs: 6.3 10*3/uL (ref 1.4–7.7)
Neutrophils Relative %: 68.4 % (ref 43.0–77.0)
Platelets: 288 10*3/uL (ref 150.0–400.0)
RBC: 5 Mil/uL (ref 3.87–5.11)
RDW: 13.5 % (ref 11.5–15.5)
WBC: 9.3 10*3/uL (ref 4.0–10.5)

## 2023-02-28 LAB — LIPID PANEL
Cholesterol: 255 mg/dL — ABNORMAL HIGH (ref 0–200)
HDL: 47.3 mg/dL (ref >39.00)
LDL Cholesterol: 166 mg/dL — ABNORMAL HIGH (ref 0–99)
NonHDL: 207.32
Total CHOL/HDL Ratio: 5
Triglycerides: 207 mg/dL — ABNORMAL HIGH (ref 0.0–149.0)
VLDL: 41.4 mg/dL — ABNORMAL HIGH (ref 0.0–40.0)

## 2023-02-28 LAB — COMPREHENSIVE METABOLIC PANEL
ALT: 26 U/L (ref 0–35)
AST: 24 U/L (ref 0–37)
Albumin: 4.1 g/dL (ref 3.5–5.2)
Alkaline Phosphatase: 62 U/L (ref 39–117)
BUN: 14 mg/dL (ref 6–23)
CO2: 31 meq/L (ref 19–32)
Calcium: 9.2 mg/dL (ref 8.4–10.5)
Chloride: 101 meq/L (ref 96–112)
Creatinine, Ser: 0.93 mg/dL (ref 0.40–1.20)
GFR: 59.98 mL/min — ABNORMAL LOW (ref >60.00)
Glucose, Bld: 98 mg/dL (ref 70–99)
Potassium: 3.7 meq/L (ref 3.5–5.1)
Sodium: 141 meq/L (ref 135–145)
Total Bilirubin: 0.5 mg/dL (ref 0.2–1.2)
Total Protein: 7.1 g/dL (ref 6.0–8.3)

## 2023-02-28 LAB — VITAMIN D 25 HYDROXY (VIT D DEFICIENCY, FRACTURES): VITD: 30.98 ng/mL (ref 30.00–100.00)

## 2023-02-28 LAB — TSH: TSH: 2 u[IU]/mL (ref 0.35–5.50)

## 2023-02-28 LAB — VITAMIN B12: Vitamin B-12: 473 pg/mL (ref 211–911)

## 2023-02-28 LAB — HEMOGLOBIN A1C: Hgb A1c MFr Bld: 6.8 % — ABNORMAL HIGH (ref 4.6–6.5)

## 2023-02-28 MED ORDER — HYDROCHLOROTHIAZIDE 25 MG PO TABS: 25.0000 mg | ORAL_TABLET | Freq: Every day | ORAL | 3 refills | Status: DC

## 2023-02-28 MED ORDER — OZEMPIC (0.25 OR 0.5 MG/DOSE) 2 MG/3ML ~~LOC~~ SOPN: 0.2500 mg | PEN_INJECTOR | SUBCUTANEOUS | 0 refills | Status: DC

## 2023-02-28 NOTE — Assessment & Plan Note (Signed)
Chronic Taking vitamin D daily Check vitamin D level  

## 2023-02-28 NOTE — Assessment & Plan Note (Addendum)
Chronic Not taking B12 daily - taking occasionally Check B12 level Stressed the importance of taking B12 on a daily basis

## 2023-02-28 NOTE — Assessment & Plan Note (Addendum)
Chronic BMI > 35 - 38.96 with prediabetes, htn, hyperlipidemia, aortic atherosclerosis Stressed regular exercise Healthy diet, decreased portions Would benefit greatly from weight loss - will consider ozempic after getting labs back

## 2023-02-28 NOTE — Assessment & Plan Note (Signed)
Chronic Check a1c Low sugar / carb diet Stressed regular exercise  

## 2023-02-28 NOTE — Assessment & Plan Note (Signed)
Chronic Improved with medication Continue Myrbetriq 50 mg daily

## 2023-02-28 NOTE — Assessment & Plan Note (Addendum)
Chronic Controlled Discussed that the hot flashes/sweats and had could possibly be from the Lexapro No other obvious cause Will check TSH Has weaned off lexapro and doing ok - monitor for now

## 2023-02-28 NOTE — Assessment & Plan Note (Signed)
Chronic Controlled Management per Dr Hilarie Fredrickson On mesalamine

## 2023-02-28 NOTE — Addendum Note (Signed)
Addended by: Pincus Sanes on: 02/28/2023 08:55 PM   Modules accepted: Orders

## 2023-02-28 NOTE — Assessment & Plan Note (Signed)
Chronic Regular exercise and healthy diet encouraged Check lipid panel  Was having muscle aches and stopped Crestor 2 weeks ago Still having some muscle aches-advised to stay off of it for at least another 2 weeks to see if that helps-depending on results could restart or consider restarting a lower dose Could consider Zetia or Repatha Following with Dr. Rennis Golden

## 2023-02-28 NOTE — Assessment & Plan Note (Signed)
Mild, intermittent  Ha been controlled for years without any inhalers-uses only as needed for URIs Recent URI - used inhalers which have helped

## 2023-02-28 NOTE — Assessment & Plan Note (Signed)
Chronic Cmp, cbc Blood pressure well controlled Continue hydrochlorothiazide 25 mg daily

## 2023-02-28 NOTE — Assessment & Plan Note (Signed)
Chronic Mild osteopenia Encouraged regular exercise Continue calcium and vitamin d

## 2023-02-28 NOTE — Assessment & Plan Note (Signed)
Chronic Continue crestor 20 mg daily Encouraged regular exercise

## 2023-03-01 ENCOUNTER — Encounter: Payer: Self-pay | Admitting: Internal Medicine

## 2023-03-02 MED ORDER — ROSUVASTATIN CALCIUM 20 MG PO TABS: 20.0000 mg | ORAL_TABLET | Freq: Every day | ORAL | 3 refills | Status: DC

## 2023-03-08 ENCOUNTER — Other Ambulatory Visit (HOSPITAL_COMMUNITY): Payer: Self-pay

## 2023-03-08 ENCOUNTER — Telehealth: Payer: Self-pay | Admitting: Pharmacy Technician

## 2023-03-08 ENCOUNTER — Ambulatory Visit: Payer: Self-pay | Admitting: Internal Medicine

## 2023-03-08 NOTE — Telephone Encounter (Signed)
  Chief Complaint: hives, itching Symptoms: hives and itching to chest/back  Frequency: since 03/01/23  Pertinent Negatives: Patient denies SOB or difficulty breathing, facial swelling, tongue swelling, fever  Disposition: [] ED /[] Urgent Care (no appt availability in office) / [x] Appointment(In office/virtual)/ []  [facility]/ [] Home Care/ [] Refused Recommended Disposition /[] [facility]/ []  Follow-up with PCP Additional Notes: Pt states she was placed on an antibiotic (cefdinir) on 12/9, completed it on 12/19. Pt states on 12/19 she developed hives and itching to her chest, under breasts, abdomen and back. Pt states she has treated at home with Benadryl and her husband's ketonazole cream without improvement. Pt very irritable on the phone; refused earlier appts at other PCP office. Pt scheduled with [facility] tomorrow and verbalizes understanding of home care advice. Copied from CRM [id number]. Topic: Clinical - Red Word Triage >> Mar 08, 2023  9:09 AM Lorin Glass B wrote: Red Word that prompted transfer to Nurse Triage: Allergic reaction to medication, thinks its the antibiotics. Hives all over and itching. Reason for Disposition  Hives or itching  Answer Assessment - Initial Assessment Questions 1. APPEARANCE: "What does the rash look like?"      Red, raised bumps.  2. LOCATION: "Where is the rash located?"      Chest, back, sides of abdomen, around waist and under breasts.  3. NUMBER: "How many hives are there?"      Around 50.  4. SIZE: "How big are the hives?" (inches, cm, compare to coins) "Do they all look the same or is there lots of variation in shape and size?"      Pt states they vary in size some are small and some are large.  5. ONSET: "When did the hives begin?" (Hours or days ago)      03/01/23.  6. ITCHING: "Does it itch?" If Yes, ask: "How bad is the itch?"    - MILD: doesn't interfere with normal activities   - MODERATE-SEVERE: interferes with work,  school, sleep, or other activities      Severe.  7. RECURRENT PROBLEM: "Have you had hives before?" If Yes, ask: "When was the last time?" and "What happened that time?"      Pt states this happens every time she takes an antibiotic with steroids. Pt states she has used the cefdinir in the past and did not break out but this time she had the reaction.  8. TRIGGERS: "Were you exposed to any new food, plant, cosmetic product or animal just before the hives began?"     Pt denies new foods or skincare products. Pt states she thinks it is related to the antibiotic.  9. OTHER SYMPTOMS: "Do you have any other symptoms?" (e.g., fever, tongue swelling, difficulty breathing, abdomen pain)     Itching.  Protocols used: Hives-A-AH, Rash - Widespread On Drugs-A-AH

## 2023-03-08 NOTE — Telephone Encounter (Signed)
Pharmacy Patient Advocate Encounter   Received notification from CoverMyMeds that prior authorization for Ozempic (0.25 or 0.5 MG/DOSE) 2MG /3ML pen-injectors is required/requested.   Insurance verification completed.   The patient is insured through [employer] .   Per test claim: The current 28 day co-pay is, $40.00.  No PA needed at this time. This test claim was processed through [facility]- copay amounts may vary at other pharmacies due to pharmacy/plan contracts, or as the patient moves through the different stages of their insurance plan.     PA NOT NEEDED, JUST MISSING DIAGNOSIS CODE  **Reached out to [facility] and told them that they had to override the rejection and input the diagnosis code E11.8 in order to get a paid claim. They tested it and could not get a paid claim on their end.

## 2023-03-09 ENCOUNTER — Encounter: Payer: Self-pay | Admitting: Family Medicine

## 2023-03-09 ENCOUNTER — Ambulatory Visit: Payer: Medicare PPO | Admitting: Family Medicine

## 2023-03-09 VITALS — BP 130/68 | HR 88 | Temp 98.2°F | Ht 64.0 in | Wt 228.8 lb

## 2023-03-09 DIAGNOSIS — T50905A Adverse effect of unspecified drugs, medicaments and biological substances, initial encounter: Secondary | ICD-10-CM

## 2023-03-09 DIAGNOSIS — R21 Rash and other nonspecific skin eruption: Secondary | ICD-10-CM | POA: Diagnosis not present

## 2023-03-09 MED ORDER — TRIAMCINOLONE ACETONIDE 0.1 % EX CREA: 1.0000 | TOPICAL_CREAM | Freq: Two times a day (BID) | CUTANEOUS | 1 refills | Status: AC

## 2023-03-09 MED ORDER — HYDROXYZINE HCL 10 MG PO TABS: 10.0000 mg | ORAL_TABLET | Freq: Three times a day (TID) | ORAL | 0 refills | Status: AC | PRN

## 2023-03-09 MED ORDER — CETIRIZINE HCL 10 MG PO TABS: 10.0000 mg | ORAL_TABLET | Freq: Every day | ORAL | 11 refills | Status: AC

## 2023-03-09 NOTE — Progress Notes (Signed)
Acute Office Visit  Subjective:     Patient ID: Kayla Maddox, female    DOB: 12-Nov-1945, 77 y.o.   MRN: [id number]  Chief Complaint  Patient presents with   Rash    Rash started on 12/19. Believes that antibiotic that had been prescribed to her has caused her rash (cefdinir) which she had started on 12/09 (last day taking antibiotics being 12/19). Rash/hives present on abdomen. Treating with ketocotanazole and benadryl.    HPI Patient is in today for evaluation of rash to back of the neck and trunk after taking cefdinir for sinusitis. Has been using ketoconazole cream as well as oral Benadryl with no relief. States that the rash began on 03/01/2023, and that was the end of her cefdinir. Denies any tongue swelling, mouth itching, shortness of breath, or facial swelling, other concerning symptoms. Medical history as outlined below.  ROS Per HPI      Objective:    BP 130/68 (BP Location: Left Arm, Patient Position: Sitting)   Pulse 88   Temp 98.2 F (36.8 C) (Oral)   Ht 5\' 4"  (1.626 m)   Wt 228 lb 12.8 oz (103.8 kg)   SpO2 96%   BMI 39.27 kg/m    Physical Exam Vitals and nursing note reviewed.  Constitutional:      General: She is not in acute distress.    Appearance: Normal appearance. She is obese.  HENT:     Head: Normocephalic and atraumatic.     Left Ear: Ear canal normal.     Nose: Nose normal.     Mouth/Throat:     Mouth: Mucous membranes are moist.     Pharynx: Oropharynx is clear. No oropharyngeal exudate or posterior oropharyngeal erythema.  Eyes:     Extraocular Movements: Extraocular movements intact.     Pupils: Pupils are equal, round, and reactive to light.  Cardiovascular:     Rate and Rhythm: Normal rate and regular rhythm.     Heart sounds: Normal heart sounds.  Pulmonary:     Effort: Pulmonary effort is normal. No respiratory distress.     Breath sounds: Normal breath sounds. No wheezing.  Musculoskeletal:        General: Normal range of  motion.     Cervical back: Normal range of motion.  Lymphadenopathy:     Cervical: No cervical adenopathy.  Skin:    Findings: Erythema and rash present. Rash is macular and papular.     Comments: Generalized maculopapular rash throughout the trunk, consistent with drug rash.  No discharge, no bleeding noted  Neurological:     General: No focal deficit present.     Mental Status: She is alert and oriented to person, place, and time.  Psychiatric:        Mood and Affect: Mood normal.        Thought Content: Thought content normal.    No results found for any visits on 03/09/23.      Assessment & Plan:  1. Adverse effect of drug, initial encounter (Primary)  - cetirizine (ZYRTEC) 10 MG tablet; Take 1 tablet (10 mg total) by mouth daily.  Dispense: 90 tablet; Refill: 11 - hydrOXYzine (ATARAX) 10 MG tablet; Take 1 tablet (10 mg total) by mouth 3 (three) times daily as needed.  Dispense: 30 tablet; Refill: 0  2. Rash  - cetirizine (ZYRTEC) 10 MG tablet; Take 1 tablet (10 mg total) by mouth daily.  Dispense: 90 tablet; Refill: 11 - hydrOXYzine (ATARAX) 10 MG  tablet; Take 1 tablet (10 mg total) by mouth 3 (three) times daily as needed.  Dispense: 30 tablet; Refill: 0 - triamcinolone cream (KENALOG) 0.1 %; Apply 1 Application topically 2 (two) times daily.  Dispense: 75 g; Refill: 1  -Discussed that triamcinolone is a topical steroid, it does not as strong as prednisone, states that she has only had a rash reaction to oral prednisone in the past.  Has never received injectable steroids that she can recall.  Declined today, will reconsider if rash does not improve   Meds ordered this encounter  Medications   cetirizine (ZYRTEC) 10 MG tablet    Sig: Take 1 tablet (10 mg total) by mouth daily.    Dispense:  90 tablet    Refill:  11   hydrOXYzine (ATARAX) 10 MG tablet    Sig: Take 1 tablet (10 mg total) by mouth 3 (three) times daily as needed.    Dispense:  30 tablet    Refill:  0    triamcinolone cream (KENALOG) 0.1 %    Sig: Apply 1 Application topically 2 (two) times daily.    Dispense:  75 g    Refill:  1    Return if symptoms worsen or fail to improve.  Moshe Cipro, FNP

## 2023-03-09 NOTE — Patient Instructions (Addendum)
Zyrtec twice a day   Hydroxyzine at night. This medication may make you sleepy. Do not drive or operate heavy machinery while taking this medication.  Will send in triamcinolone cream twice a day to the affected area as needed.  Follow-up with me for new or worsening symptoms.

## 2023-03-12 ENCOUNTER — Other Ambulatory Visit: Payer: Self-pay

## 2023-03-12 DIAGNOSIS — E118 Type 2 diabetes mellitus with unspecified complications: Secondary | ICD-10-CM

## 2023-03-12 MED ORDER — OZEMPIC (0.25 OR 0.5 MG/DOSE) 2 MG/3ML ~~LOC~~ SOPN: 0.2500 mg | PEN_INJECTOR | SUBCUTANEOUS | 0 refills | Status: DC

## 2023-03-12 NOTE — Telephone Encounter (Signed)
Script faxed with corrected dx codes for approval.

## 2023-04-11 ENCOUNTER — Encounter: Payer: Self-pay | Admitting: Internal Medicine

## 2023-04-12 ENCOUNTER — Encounter: Payer: Self-pay | Admitting: Internal Medicine

## 2023-04-15 MED ORDER — GEMTESA 75 MG PO TABS: 75.0000 mg | ORAL_TABLET | Freq: Every day | ORAL | 5 refills | Status: AC

## 2023-04-19 DIAGNOSIS — Z1231 Encounter for screening mammogram for malignant neoplasm of breast: Secondary | ICD-10-CM | POA: Diagnosis not present

## 2023-04-19 LAB — HM MAMMOGRAPHY

## 2023-05-15 ENCOUNTER — Telehealth: Payer: Self-pay | Admitting: Emergency Medicine

## 2023-05-15 NOTE — Telephone Encounter (Signed)
 Dahlia Client states patient needs refill for [employer] inhaler. Needs 3 month supply. Pharmacy is [facility] mail order. Dahlia Client phone number is [phone].

## 2023-05-16 MED ORDER — BREZTRI AEROSPHERE 160-9-4.8 MCG/ACT IN AERO
INHALATION_SPRAY | RESPIRATORY_TRACT | 2 refills | Status: AC
Start: 2023-05-16 — End: ?

## 2023-06-01 DIAGNOSIS — L570 Actinic keratosis: Secondary | ICD-10-CM | POA: Diagnosis not present

## 2023-06-01 DIAGNOSIS — Z85828 Personal history of other malignant neoplasm of skin: Secondary | ICD-10-CM | POA: Diagnosis not present

## 2023-06-01 DIAGNOSIS — Z08 Encounter for follow-up examination after completed treatment for malignant neoplasm: Secondary | ICD-10-CM | POA: Diagnosis not present

## 2023-06-01 DIAGNOSIS — Z7189 Other specified counseling: Secondary | ICD-10-CM | POA: Diagnosis not present

## 2023-06-01 DIAGNOSIS — R208 Other disturbances of skin sensation: Secondary | ICD-10-CM | POA: Diagnosis not present

## 2023-06-01 DIAGNOSIS — L814 Other melanin hyperpigmentation: Secondary | ICD-10-CM | POA: Diagnosis not present

## 2023-06-01 DIAGNOSIS — R58 Hemorrhage, not elsewhere classified: Secondary | ICD-10-CM | POA: Diagnosis not present

## 2023-06-01 DIAGNOSIS — L820 Inflamed seborrheic keratosis: Secondary | ICD-10-CM | POA: Diagnosis not present

## 2023-06-01 DIAGNOSIS — D225 Melanocytic nevi of trunk: Secondary | ICD-10-CM | POA: Diagnosis not present

## 2023-06-18 ENCOUNTER — Other Ambulatory Visit: Payer: Self-pay | Admitting: Internal Medicine

## 2023-08-28 ENCOUNTER — Encounter: Payer: Self-pay | Admitting: Internal Medicine

## 2023-08-28 NOTE — Patient Instructions (Addendum)
      Blood work was ordered.       Medications changes include :   ozempic  0.25 mg weekly, start zetia  10 mg daily for cholesterol     Return in about 6 months (around 02/28/2024) for Physical Exam.

## 2023-08-28 NOTE — Progress Notes (Signed)
 Subjective:    Patient ID: Kayla Maddox, female    DOB: 24-Sep-1945, 78 y.o.   MRN: [id number]     HPI Doxie is here for follow up of her chronic medical problems.  Stress headaches - tylenol  helps but they recur.  Mostly in neck and back of head.  She feels that it is related to increased stress.  Retried crestor  - did not tolerate it - rash.  Did not tolerate another statin in the past as well.  Medications and allergies reviewed with patient and updated if appropriate.  Current Outpatient Medications on File Prior to Visit  Medication Sig Dispense Refill   albuterol  (VENTOLIN  HFA) 108 (90 Base) MCG/ACT inhaler Inhale 2 puffs into the lungs every 4 (four) hours as needed for wheezing or shortness of breath. 8 g 6   ALPRAZolam  (XANAX ) 0.25 MG tablet Take 1 tablet (0.25 mg total) by mouth 2 (two) times daily as needed for anxiety. 60 tablet 5   Ascorbic Acid (VITAMIN C) 1000 MG tablet Take 1,000 mg by mouth daily.     benzonatate  (TESSALON ) 200 MG capsule TAKE ONE CAPSULE BY MOUTH THREE TIMES DAILY AS NEEDED FOR COUGH 90 capsule 0   Budeson-Glycopyrrol-Formoterol  (BREZTRI  AEROSPHERE) 160-9-4.8 MCG/ACT AERO INHALE 2 PUFFS TWICE DAILY IN THE MORNING & AT BEDTIME 32.1 g 2   Calcium  Carbonate (CALCIUM  500 PO) Take by mouth.     cetirizine  (ZYRTEC ) 10 MG tablet Take 1 tablet (10 mg total) by mouth daily. 90 tablet 11   Coenzyme Q10 (CO Q-10) 200 MG CAPS Take 200 mg by mouth.     cyclobenzaprine  (FLEXERIL ) 5 MG tablet Take 1 tablet (5 mg total) by mouth at bedtime. 30 tablet 0   fluticasone  (FLONASE ) 50 MCG/ACT nasal spray INHALE 2 SPRAYS IN EACH NOSTRIL EVERY DAY 16 g 3   hydrochlorothiazide  (HYDRODIURIL ) 25 MG tablet Take 1 tablet (25 mg total) by mouth daily. 90 tablet 3   hydrOXYzine  (ATARAX ) 10 MG tablet Take 1 tablet (10 mg total) by mouth 3 (three) times daily as needed. 30 tablet 0   mesalamine  (APRISO ) 0.375 g 24 hr capsule TAKE 4 CAPSULES EVERY DAY 120 capsule 2   mometasone   (NASONEX ) 50 MCG/ACT nasal spray Place 2 sprays into the nose daily. 1 each 12   triamcinolone  cream (KENALOG ) 0.1 % Apply 1 Application topically 2 (two) times daily. 75 g 1   Vibegron  (GEMTESA ) 75 MG TABS Take 1 tablet (75 mg total) by mouth daily. 30 tablet 5   vitamin B-12 (CYANOCOBALAMIN ) 1000 MCG tablet Take 1 tablet (1,000 mcg total) by mouth daily.     No current facility-administered medications on file prior to visit.     Review of Systems  Constitutional:  Negative for fever.  Respiratory:  Positive for cough (all the time) and shortness of breath (chronic). Negative for wheezing.   Cardiovascular:  Negative for chest pain, palpitations and leg swelling.  Gastrointestinal:        No gerd  Neurological:  Positive for light-headedness (occ with standing) and headaches (posterior head).       Objective:   Vitals:   08/29/23 1305  BP: 128/72  Pulse: 70  Temp: 98.6 F (37 C)  SpO2: 94%   BP Readings from Last 3 Encounters:  08/29/23 128/72  03/09/23 130/68  02/28/23 126/72   Wt Readings from Last 3 Encounters:  08/29/23 217 lb (98.4 kg)  03/09/23 228 lb 12.8 oz (103.8 kg)  02/28/23  227 lb (103 kg)   Body mass index is 37.25 kg/m.    Physical Exam Constitutional:      General: She is not in acute distress.    Appearance: Normal appearance.  HENT:     Head: Normocephalic and atraumatic.   Eyes:     Conjunctiva/sclera: Conjunctivae normal.    Cardiovascular:     Rate and Rhythm: Normal rate and regular rhythm.     Heart sounds: Normal heart sounds.  Pulmonary:     Effort: Pulmonary effort is normal. No respiratory distress.     Breath sounds: Normal breath sounds. No wheezing.   Musculoskeletal:     Cervical back: Neck supple.     Right lower leg: No edema.     Left lower leg: No edema.  Lymphadenopathy:     Cervical: No cervical adenopathy.   Skin:    General: Skin is warm and dry.     Findings: No rash.   Neurological:     Mental Status:  She is alert. Mental status is at baseline.   Psychiatric:        Mood and Affect: Mood normal.        Behavior: Behavior normal.       Diabetic Foot Exam - Simple   Simple Foot Form Diabetic Foot exam was performed with the following findings: Yes 08/29/2023  1:39 PM  Visual Inspection No deformities, no ulcerations, no other skin breakdown bilaterally: Yes Sensation Testing Intact to touch and monofilament testing bilaterally: Yes Pulse Check Posterior Tibialis and Dorsalis pulse intact bilaterally: Yes Comments      Lab Results  Component Value Date   WBC 9.3 02/28/2023   HGB 14.9 02/28/2023   HCT 44.2 02/28/2023   PLT 288.0 02/28/2023   GLUCOSE 98 02/28/2023   CHOL 255 (H) 02/28/2023   TRIG 207.0 (H) 02/28/2023   HDL 47.30 02/28/2023   LDLDIRECT 183.0 08/16/2022   LDLCALC 166 (H) 02/28/2023   ALT 26 02/28/2023   AST 24 02/28/2023   NA 141 02/28/2023   K 3.7 02/28/2023   CL 101 02/28/2023   CREATININE 0.93 02/28/2023   BUN 14 02/28/2023   CO2 31 02/28/2023   TSH 2.00 02/28/2023   HGBA1C 6.8 (H) 02/28/2023     Assessment & Plan:    See Problem List for Assessment and Plan of chronic medical problems.

## 2023-08-29 ENCOUNTER — Ambulatory Visit: Payer: Medicare PPO | Admitting: Internal Medicine

## 2023-08-29 ENCOUNTER — Ambulatory Visit: Payer: Self-pay | Admitting: Internal Medicine

## 2023-08-29 ENCOUNTER — Telehealth: Payer: Self-pay

## 2023-08-29 ENCOUNTER — Other Ambulatory Visit (HOSPITAL_COMMUNITY): Payer: Self-pay

## 2023-08-29 VITALS — BP 128/72 | HR 70 | Temp 98.6°F | Ht 64.0 in | Wt 217.0 lb

## 2023-08-29 DIAGNOSIS — E782 Mixed hyperlipidemia: Secondary | ICD-10-CM | POA: Diagnosis not present

## 2023-08-29 DIAGNOSIS — F419 Anxiety disorder, unspecified: Secondary | ICD-10-CM

## 2023-08-29 DIAGNOSIS — N3281 Overactive bladder: Secondary | ICD-10-CM

## 2023-08-29 DIAGNOSIS — I10 Essential (primary) hypertension: Secondary | ICD-10-CM | POA: Diagnosis not present

## 2023-08-29 DIAGNOSIS — R519 Headache, unspecified: Secondary | ICD-10-CM

## 2023-08-29 DIAGNOSIS — Z794 Long term (current) use of insulin: Secondary | ICD-10-CM | POA: Diagnosis not present

## 2023-08-29 DIAGNOSIS — F32A Depression, unspecified: Secondary | ICD-10-CM

## 2023-08-29 DIAGNOSIS — E118 Type 2 diabetes mellitus with unspecified complications: Secondary | ICD-10-CM

## 2023-08-29 LAB — MICROALBUMIN / CREATININE URINE RATIO
Creatinine,U: 267.8 mg/dL
Microalb Creat Ratio: 6 mg/g (ref 0.0–30.0)
Microalb, Ur: 1.6 mg/dL (ref 0.0–1.9)

## 2023-08-29 LAB — COMPREHENSIVE METABOLIC PANEL WITH GFR
ALT: 17 U/L (ref 0–35)
AST: 19 U/L (ref 0–37)
Albumin: 4.2 g/dL (ref 3.5–5.2)
Alkaline Phosphatase: 68 U/L (ref 39–117)
BUN: 11 mg/dL (ref 6–23)
CO2: 31 meq/L (ref 19–32)
Calcium: 9.3 mg/dL (ref 8.4–10.5)
Chloride: 104 meq/L (ref 96–112)
Creatinine, Ser: 0.91 mg/dL (ref 0.40–1.20)
GFR: 61.35 mL/min (ref >60.00)
Glucose, Bld: 97 mg/dL (ref 70–99)
Potassium: 3.8 meq/L (ref 3.5–5.1)
Sodium: 140 meq/L (ref 135–145)
Total Bilirubin: 0.6 mg/dL (ref 0.2–1.2)
Total Protein: 7.3 g/dL (ref 6.0–8.3)

## 2023-08-29 LAB — LIPID PANEL
Cholesterol: 246 mg/dL — ABNORMAL HIGH (ref 0–200)
HDL: 46.5 mg/dL (ref >39.00)
LDL Cholesterol: 153 mg/dL — ABNORMAL HIGH (ref 0–99)
NonHDL: 199.02
Total CHOL/HDL Ratio: 5
Triglycerides: 230 mg/dL — ABNORMAL HIGH (ref 0.0–149.0)
VLDL: 46 mg/dL — ABNORMAL HIGH (ref 0.0–40.0)

## 2023-08-29 LAB — HEMOGLOBIN A1C: Hgb A1c MFr Bld: 6.2 % (ref 4.6–6.5)

## 2023-08-29 MED ORDER — OZEMPIC (0.25 OR 0.5 MG/DOSE) 2 MG/3ML ~~LOC~~ SOPN: 0.2500 mg | PEN_INJECTOR | SUBCUTANEOUS | 0 refills | Status: DC

## 2023-08-29 MED ORDER — EZETIMIBE 10 MG PO TABS: 10.0000 mg | ORAL_TABLET | Freq: Every day | ORAL | 3 refills | Status: AC

## 2023-08-29 NOTE — Assessment & Plan Note (Signed)
 Chronic Controlled No longer on lexapro  Continue alprazolam  0.25 mg twice daily.

## 2023-08-29 NOTE — Assessment & Plan Note (Signed)
 New Having frequent occipital headaches Typically related to neck tension-could be related to increased rest Advised heat, stretching, topical medications Advise deep breathing few times during the day to see if that helps with some of the stress If symptoms persistent can consider muscle relaxer

## 2023-08-29 NOTE — Assessment & Plan Note (Addendum)
 Chronic  Lab Results  Component Value Date   HGBA1C 6.8 (H) 02/28/2023   Sugars controlled Check A1c, urine albumin/creatinine today Start Ozempic  0.25 mg weekly-after 1 month if tolerated will increase to 0.5 mg weekly Stressed regular exercise, diabetic diet

## 2023-08-29 NOTE — Assessment & Plan Note (Signed)
 Chronic Not exercising She does not eat much Encouraged diet high in protein Started on Ozempic  for her diabetes and hopefully this will help

## 2023-08-29 NOTE — Assessment & Plan Note (Addendum)
 Chronic Regular exercise and healthy diet encouraged Check lipid panel  Statin intolerant  Start  Zetia  10 mg daily

## 2023-08-29 NOTE — Assessment & Plan Note (Signed)
 Chronic Cmp Blood pressure well controlled Continue hydrochlorothiazide  25 mg daily

## 2023-08-29 NOTE — Telephone Encounter (Signed)
 Pharmacy Patient Advocate Encounter  Received notification from [employer] that Prior Authorization for Ozempic  (0.25 or 0.5 MG/DOSE) 2MG /3ML pen-injectors  has been APPROVED  Ran test claim, Copay is $0.00 PT HAS MET DEDUCTIBLE.  SEE TEST BILLING BELOW   This test claim was processed through [facility]- copay amounts may vary at other pharmacies due to pharmacy/plan contracts, or as the patient moves through the different stages of their insurance plan.

## 2023-08-29 NOTE — Assessment & Plan Note (Signed)
 Chronic Improved with medication Continue Gemtesa  75 mg daily

## 2023-08-30 ENCOUNTER — Encounter: Payer: Self-pay | Admitting: Internal Medicine

## 2023-09-07 ENCOUNTER — Other Ambulatory Visit (HOSPITAL_COMMUNITY): Payer: Self-pay

## 2023-09-07 ENCOUNTER — Telehealth: Payer: Self-pay

## 2023-09-07 ENCOUNTER — Other Ambulatory Visit: Payer: Self-pay

## 2023-09-07 DIAGNOSIS — E118 Type 2 diabetes mellitus with unspecified complications: Secondary | ICD-10-CM

## 2023-09-07 MED ORDER — OZEMPIC (0.25 OR 0.5 MG/DOSE) 2 MG/3ML ~~LOC~~ SOPN: 0.2500 mg | PEN_INJECTOR | SUBCUTANEOUS | 0 refills | Status: DC

## 2023-09-07 NOTE — Telephone Encounter (Signed)
 Pharmacy Patient Advocate Encounter  Received notification from [employer] that Prior Authorization for Ozempic  2mg /75ml has been APPROVED from 03/14/23 to 03/12/24. Ran test claim, Copay is $0. This test claim was processed through [facility]- copay amounts may vary at other pharmacies due to pharmacy/plan contracts, or as the patient moves through the different stages of their insurance plan.   PA #/Case ID/Reference #: [id number]  Placed a call to [facility] to notify to the approval. Per Chiquita, she states they don't stock the medication regularly and asked if the medication order be sent to [facility].

## 2023-09-07 NOTE — Telephone Encounter (Signed)
 Pharmacy Patient Advocate Encounter   Received notification from Pt Calls Messages that prior authorization for Ozempic  2mg /75ml is required/requested.   Insurance verification completed.   The patient is insured through [employer] .   Per test claim: PA required; PA submitted to above mentioned insurance via CoverMyMeds Key/confirmation #/EOC [id number] Status is pending

## 2023-09-07 NOTE — Telephone Encounter (Signed)
 Patient notified via mychart.  Waiting on response to see if she wants to have it shipped to [facility] or if she wants to come to [facility] and pick it up.

## 2023-10-10 ENCOUNTER — Telehealth: Payer: Self-pay | Admitting: Internal Medicine

## 2023-10-10 NOTE — Telephone Encounter (Signed)
 Patient left dmv form to be filled and mailed to her when ready. Form placed in providers box up front

## 2023-10-11 NOTE — Telephone Encounter (Signed)
Received and awaiting signature

## 2023-10-12 NOTE — Telephone Encounter (Signed)
 Form mailed out today in outgoing mail bin.

## 2023-11-27 DIAGNOSIS — R231 Pallor: Secondary | ICD-10-CM | POA: Diagnosis not present

## 2023-11-27 DIAGNOSIS — R61 Generalized hyperhidrosis: Secondary | ICD-10-CM | POA: Diagnosis not present

## 2023-11-27 DIAGNOSIS — Z87891 Personal history of nicotine dependence: Secondary | ICD-10-CM | POA: Diagnosis not present

## 2023-11-27 DIAGNOSIS — R42 Dizziness and giddiness: Secondary | ICD-10-CM | POA: Diagnosis not present

## 2023-11-27 DIAGNOSIS — R197 Diarrhea, unspecified: Secondary | ICD-10-CM | POA: Diagnosis not present

## 2023-11-27 DIAGNOSIS — Z79899 Other long term (current) drug therapy: Secondary | ICD-10-CM | POA: Diagnosis not present

## 2023-11-27 DIAGNOSIS — I10 Essential (primary) hypertension: Secondary | ICD-10-CM | POA: Diagnosis not present

## 2023-11-27 DIAGNOSIS — E7800 Pure hypercholesterolemia, unspecified: Secondary | ICD-10-CM | POA: Diagnosis not present

## 2023-11-27 DIAGNOSIS — R110 Nausea: Secondary | ICD-10-CM | POA: Diagnosis not present

## 2023-11-27 DIAGNOSIS — K5090 Crohn's disease, unspecified, without complications: Secondary | ICD-10-CM | POA: Diagnosis not present

## 2023-11-27 DIAGNOSIS — J449 Chronic obstructive pulmonary disease, unspecified: Secondary | ICD-10-CM | POA: Diagnosis not present

## 2023-12-05 DIAGNOSIS — R42 Dizziness and giddiness: Secondary | ICD-10-CM | POA: Diagnosis not present

## 2023-12-06 DIAGNOSIS — L820 Inflamed seborrheic keratosis: Secondary | ICD-10-CM | POA: Diagnosis not present

## 2023-12-06 DIAGNOSIS — R58 Hemorrhage, not elsewhere classified: Secondary | ICD-10-CM | POA: Diagnosis not present

## 2023-12-06 DIAGNOSIS — L821 Other seborrheic keratosis: Secondary | ICD-10-CM | POA: Diagnosis not present

## 2023-12-06 DIAGNOSIS — L738 Other specified follicular disorders: Secondary | ICD-10-CM | POA: Diagnosis not present

## 2023-12-06 DIAGNOSIS — Z7189 Other specified counseling: Secondary | ICD-10-CM | POA: Diagnosis not present

## 2023-12-06 DIAGNOSIS — L814 Other melanin hyperpigmentation: Secondary | ICD-10-CM | POA: Diagnosis not present

## 2023-12-06 DIAGNOSIS — R208 Other disturbances of skin sensation: Secondary | ICD-10-CM | POA: Diagnosis not present

## 2023-12-14 DIAGNOSIS — H903 Sensorineural hearing loss, bilateral: Secondary | ICD-10-CM | POA: Diagnosis not present

## 2024-01-03 DIAGNOSIS — H43393 Other vitreous opacities, bilateral: Secondary | ICD-10-CM | POA: Diagnosis not present

## 2024-01-03 DIAGNOSIS — H26493 Other secondary cataract, bilateral: Secondary | ICD-10-CM | POA: Diagnosis not present

## 2024-01-12 DIAGNOSIS — Z23 Encounter for immunization: Secondary | ICD-10-CM | POA: Diagnosis not present

## 2024-02-03 ENCOUNTER — Other Ambulatory Visit: Payer: Self-pay | Admitting: Internal Medicine

## 2024-02-09 ENCOUNTER — Encounter: Payer: Self-pay | Admitting: Internal Medicine

## 2024-02-13 ENCOUNTER — Ambulatory Visit

## 2024-02-14 ENCOUNTER — Telehealth: Payer: Self-pay

## 2024-02-14 NOTE — Telephone Encounter (Signed)
 Copied from CRM [id number]. Topic: General - Call Back - No Documentation >> Feb 13, 2024  4:17 PM Alfonso ORN wrote: Reason for CRM: pt called back for Katrina . Contacted CAL who advised crm be sent for callback.  Please call pt back

## 2024-02-18 ENCOUNTER — Other Ambulatory Visit (HOSPITAL_COMMUNITY): Payer: Self-pay

## 2024-03-14 ENCOUNTER — Encounter: Admitting: Internal Medicine

## 2024-03-17 ENCOUNTER — Ambulatory Visit: Payer: Self-pay

## 2024-03-17 NOTE — Telephone Encounter (Signed)
 FYI Only or Action Required?: FYI only for provider: appointment scheduled on 03/18/24.  Patient was last seen in primary care on 08/29/2023 by Geofm Glade PARAS, MD.  Called Nurse Triage reporting Depression.  Symptoms began about a month ago.  Interventions attempted: Rest, hydration, or home remedies.  Symptoms are: gradually worsening.  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 Husband was in the hospital around then with exacerbation of health problems. No SI or HI. At home currently. Able to go about ADL's. Wanting to have referral sent to a therapist or counselor. Scheduled appt with different provider at home office tomorrow per pt preference d/t schedule concerns. Advised to call back for worsening symptoms, [phone] crisis number provided.      Copied from CRM [id number]. Topic: Clinical - Red Word Triage >> Mar 17, 2024  4:02 PM Rea ORN wrote: Red Word that prompted transfer to Nurse Triage: Mental Health. Pt feels overwhelmed with the responsibility of caring for her spouse. She stated he is rapidly going down hill and she is responsible for taking care of everything. She is asking for a referral to be sent so she can get a mental health counselor as soon as possible. Reason for Disposition  Requesting to talk with a counselor (mental health worker, psychiatrist, etc.)  Answer Assessment - Initial Assessment Questions 1. CONCERN: What happened that made you call today?     Feeling overwhelmed and depressed  2. DEPRESSION SYMPTOM SCREENING: How are you feeling overall? (e.g., decreased energy, increased sleeping or difficulty sleeping, difficulty concentrating, feelings of sadness, guilt, hopelessness, or worthlessness)     Increasingly depressed and overwhelmed.   3. RISK OF HARM - SUICIDAL  IDEATION:  Do you ever have thoughts of hurting or killing yourself?  (e.g., yes, no, no but preoccupation with thoughts about death)     Denies  4. RISK OF HARM - HOMICIDAL IDEATION:  Do you ever have thoughts of hurting or killing someone else?  (e.g., yes, no, no but preoccupation with thoughts about death)     Denies  5. FUNCTIONAL IMPAIRMENT: How have things been going for you overall? Have you had more difficulty than usual doing your normal daily activities?  (e.g., better, same, worse; self-care, school, work, interactions)     Able to go about ADL's  6. SUPPORT: Who is with you now? Who do you live with? Do you have family or friends who you can talk to?      Just home with her husband now. Has a daughter in law she can talk to sometimes.   7. THERAPIST: Do you have a counselor or therapist? If Yes, ask: What is their name?     Denies  8. STRESSORS: Has there been any new stress or recent changes in your life?     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  9. ALCOHOL USE OR SUBSTANCE USE (DRUG USE): Do you drink alcohol or use any illegal drugs?     Denies  10. OTHER: Do you have any other physical symptoms right now? (e.g., fever)       Feeling depressed and overwhelmed.  Protocols used: Depression-A-AH

## 2024-03-18 ENCOUNTER — Ambulatory Visit: Admitting: Internal Medicine

## 2024-03-18 ENCOUNTER — Encounter: Payer: Self-pay | Admitting: Internal Medicine

## 2024-03-18 VITALS — BP 130/82 | HR 90 | Temp 98.9°F | Ht 64.0 in | Wt 211.0 lb

## 2024-03-18 DIAGNOSIS — F32A Depression, unspecified: Secondary | ICD-10-CM | POA: Diagnosis not present

## 2024-03-18 DIAGNOSIS — F419 Anxiety disorder, unspecified: Secondary | ICD-10-CM | POA: Diagnosis not present

## 2024-03-18 MED ORDER — ESCITALOPRAM OXALATE 10 MG PO TABS: 10.0000 mg | ORAL_TABLET | Freq: Every day | ORAL | 3 refills | Status: DC

## 2024-03-18 MED ORDER — HYDROCHLOROTHIAZIDE 25 MG PO TABS: 25.0000 mg | ORAL_TABLET | Freq: Every day | ORAL | 3 refills | Status: AC

## 2024-03-18 MED ORDER — ALPRAZOLAM 0.25 MG PO TABS: 0.2500 mg | ORAL_TABLET | Freq: Two times a day (BID) | ORAL | 2 refills | Status: AC | PRN

## 2024-03-18 NOTE — Progress Notes (Signed)
 Patient ID: Kayla Maddox, female   DOB: [birth year], 79 y.o.   MRN: [id number]        Chief Complaint: follow up anxiety depression       HPI:  Kayla Maddox is a 79 y.o. female here with c/o worsening now difficult to cope with; husband has dementia and requires constant care, and she is main caretaker.  Has had mild to mod worsening depressive symptoms, but no suicidal ideation, or panic; has ongoing anxiety, also worsening, needs med refills.  Also requests counseling referral specifically to [facility].  Pt denies chest pain, increased sob or doe, wheezing, orthopnea, PND, increased LE swelling, palpitations, dizziness or syncope.         Wt Readings from Last 3 Encounters:  03/18/24 211 lb (95.7 kg)  08/29/23 217 lb (98.4 kg)  03/09/23 228 lb 12.8 oz (103.8 kg)   BP Readings from Last 3 Encounters:  03/18/24 130/82  08/29/23 128/72  03/09/23 130/68         Past Medical History:  Diagnosis Date   Allergic rhinitis    Allergy     Anxiety    Arthritis    Cancer (HCC)    skin cancer    Cataract    bilat removed    Chronic colitis    Crohn's colitis (HCC)    Diverticulosis    Fundic gland polyps of stomach, benign    Gallbladder polyp 2017   Gastric heterotopia    GERD (gastroesophageal reflux disease)    Hyperlipidemia    Hyperplastic colon polyp    Hypertension    Internal hemorrhoids    Obesity    Serrated adenoma of colon    Sleep apnea    does not use CPAP   Tubular adenoma of colon    Past Surgical History:  Procedure Laterality Date   BLADDER SUSPENSION  2011   cataract surgery Bilateral 2017   COLONOSCOPY  08/22/2016   [facility]   FOOT SURGERY Left 2005   NASAL SINUS SURGERY  2012   POLYPECTOMY     SPINE SURGERY  1994   UPPER GASTROINTESTINAL ENDOSCOPY  2014    reports that she quit smoking about 43 years ago. Her smoking use included cigarettes. She started smoking about 63 years ago. She has a 20 pack-year smoking history. She has never used  smokeless tobacco. She reports that she does not drink alcohol and does not use drugs. family history includes Diabetes in her father; Heart disease in her father and mother; Hypertension in her father and mother. Allergies[1] Medications Ordered Prior to Encounter[2]      ROS:  All others reviewed and negative.  Objective        PE:  BP 130/82 (BP Location: Right Arm, Patient Position: Sitting, Cuff Size: Normal)   Pulse 90   Temp 98.9 F (37.2 C) (Oral)   Ht 5' 4 (1.626 m)   Wt 211 lb (95.7 kg)   SpO2 94%   BMI 36.22 kg/m                 Constitutional: Pt appears in NAD               HENT: Head: NCAT.                Right Ear: External ear normal.                 Left Ear: External ear normal.  Eyes: . Pupils are equal, round, and reactive to light. Conjunctivae and EOM are normal               Nose: without d/c or deformity               Neck: Neck supple. Gross normal ROM               Cardiovascular: Normal rate and regular rhythm.                 Pulmonary/Chest: Effort normal and breath sounds without rales or wheezing.                               Neurological: Pt is alert. At baseline orientation, motor grossly intact               Skin: Skin is warm. No rashes, no other new lesions, LE edema - none               Psychiatric: Pt behavior is normal without agitation , depressed affect, stressed nervous  Micro: none  Cardiac tracings I have personally interpreted today:  none  Pertinent Radiological findings (summarize): none   Lab Results  Component Value Date   WBC 9.3 02/28/2023   HGB 14.9 02/28/2023   HCT 44.2 02/28/2023   PLT 288.0 02/28/2023   GLUCOSE 97 08/29/2023   CHOL 246 (H) 08/29/2023   TRIG 230.0 (H) 08/29/2023   HDL 46.50 08/29/2023   LDLDIRECT 183.0 08/16/2022   LDLCALC 153 (H) 08/29/2023   ALT 17 08/29/2023   AST 19 08/29/2023   NA 140 08/29/2023   K 3.8 08/29/2023   CL 104 08/29/2023   CREATININE 0.91 08/29/2023   BUN 11  08/29/2023   CO2 31 08/29/2023   TSH 2.00 02/28/2023   HGBA1C 6.2 08/29/2023   MICROALBUR 1.6 08/29/2023   Assessment/Plan:  Kayla Maddox is a 79 y.o. White or Caucasian [1] female with  has a past medical history of Allergic rhinitis, Allergy , Anxiety, Arthritis, Cancer (HCC), Cataract, Chronic colitis, Crohn's colitis (HCC), Diverticulosis, Fundic gland polyps of stomach, benign, Gallbladder polyp (2017), Gastric heterotopia, GERD (gastroesophageal reflux disease), Hyperlipidemia, Hyperplastic colon polyp, Hypertension, Internal hemorrhoids, Obesity, Serrated adenoma of colon, Sleep apnea, and Tubular adenoma of colon.  Anxiety and depression Mild to mod worsening, she is working on increased help with the husband, also for referral counseling as requested, restart lexapro  10 mg every day, and xanax  prn refill,  to f/u any worsening symptoms or concerns  Followup: Return if symptoms worsen or fail to improve.  Lynwood Rush, MD 03/18/2024 6:55 PM [facility]     [1]  Allergies Allergen Reactions   Simvastatin      Muscle weakness   Amoxicillin Rash   Cefdinir  Rash   Clarithromycin Rash   Crestor  [Rosuvastatin ] Rash and Other (See Comments)   Doxycycline Rash   Prednisone  Rash   Sulfonamide Derivatives Rash  [2]  Current Outpatient Medications on File Prior to Visit  Medication Sig Dispense Refill   albuterol  (VENTOLIN  HFA) 108 (90 Base) MCG/ACT inhaler Inhale 2 puffs into the lungs every 4 (four) hours as needed for wheezing or shortness of breath. 8 g 6   Ascorbic Acid (VITAMIN C) 1000 MG tablet Take 1,000 mg by mouth daily.     benzonatate  (TESSALON ) 200 MG capsule TAKE ONE CAPSULE BY MOUTH  THREE TIMES DAILY AS NEEDED FOR COUGH 90 capsule 0   Budeson-Glycopyrrol-Formoterol  (BREZTRI  AEROSPHERE) 160-9-4.8 MCG/ACT AERO INHALE 2 PUFFS TWICE DAILY IN THE MORNING & AT BEDTIME 32.1 g 2   Calcium  Carbonate (CALCIUM  500  PO) Take by mouth.     cetirizine  (ZYRTEC ) 10 MG tablet Take 1 tablet (10 mg total) by mouth daily. 90 tablet 11   Coenzyme Q10 (CO Q-10) 200 MG CAPS Take 200 mg by mouth.     cyclobenzaprine  (FLEXERIL ) 5 MG tablet Take 1 tablet (5 mg total) by mouth at bedtime. 30 tablet 0   ezetimibe  (ZETIA ) 10 MG tablet Take 1 tablet (10 mg total) by mouth daily. 90 tablet 3   fluticasone  (FLONASE ) 50 MCG/ACT nasal spray INHALE 2 SPRAYS IN EACH NOSTRIL DAILY 16 g 3   hydrOXYzine  (ATARAX ) 10 MG tablet Take 1 tablet (10 mg total) by mouth 3 (three) times daily as needed. 30 tablet 0   meclizine (ANTIVERT) 25 MG tablet Take 12.5 mg by mouth 2 (two) times daily as needed.     mesalamine  (APRISO ) 0.375 g 24 hr capsule TAKE 4 CAPSULES EVERY DAY 120 capsule 2   mometasone  (NASONEX ) 50 MCG/ACT nasal spray Place 2 sprays into the nose daily. 1 each 12   Semaglutide ,0.25 or 0.5MG /DOS, (OZEMPIC , 0.25 OR 0.5 MG/DOSE,) 2 MG/3ML SOPN Inject 0.25 mg into the skin once a week. Dx E11.8 9 mL 0   triamcinolone  cream (KENALOG ) 0.1 % Apply 1 Application topically 2 (two) times daily. 75 g 1   Vibegron  (GEMTESA ) 75 MG TABS Take 1 tablet (75 mg total) by mouth daily. 30 tablet 5   vitamin B-12 (CYANOCOBALAMIN ) 1000 MCG tablet Take 1 tablet (1,000 mcg total) by mouth daily.     No current facility-administered medications on file prior to visit.

## 2024-03-18 NOTE — Patient Instructions (Signed)
 Please take all new medication as prescribed- the lexapro  10 mg per day  Please continue all other medications as before, and refills have been done for the xanax   Please have the pharmacy call with any other refills you may need.  Please keep your appointments with your specialists as you may have planned  You will be contacted regarding the referral for: Psychology counseling at [facility]  Please see Dr Geofm as you have planned in follow up

## 2024-03-18 NOTE — Assessment & Plan Note (Signed)
 Mild to mod worsening, she is working on increased help with the husband, also for referral counseling as requested, restart lexapro  10 mg every day, and xanax  prn refill,  to f/u any worsening symptoms or concerns

## 2024-03-21 ENCOUNTER — Telehealth: Payer: Self-pay | Admitting: Internal Medicine

## 2024-03-21 NOTE — Telephone Encounter (Signed)
 Copied from CRM [id number]. Topic: Referral - Question >> Mar 21, 2024  1:04 PM Alfonso HERO wrote: Reason for CRM: patient calling to inform that her [facility] referral was [facility] to [facility] and she wants it sent to [facility] counseling. Ph [phone] Fax [phone].

## 2024-03-24 NOTE — Telephone Encounter (Signed)
 Ok sure I will forward to [facility] for consideration   thanks

## 2024-03-31 IMAGING — CT CT CHEST SUPER D W/O CM
1 of 2 series · 14 of 32 positions shown, 18 images · non-contrast
Comparison: Chest CT 07/27/2020 and cardiac CT 07/09/2019.

CLINICAL DATA: Chronic cough with exertional shortness of breath
for 10-13 years. Former smoker. Follow up pulmonary nodules.

EXAM:
CT CHEST WITHOUT CONTRAST
TECHNIQUE: Multidetector CT imaging of the chest was performed using thin slice
collimation for electromagnetic bronchoscopy planning purposes,
without intravenous contrast.
RADIATION DOSE REDUCTION: This exam was performed according to the
departmental dose-optimization program which includes automated
exposure control, adjustment of the mA and/or kV according to
patient size and/or use of iterative reconstruction technique.

[Series 6: super d · axial · 0.91mm/px · z∈[+706,+1003]mm · 14 of 415 slices shown, 18 images]
[im 22/415  mediastinal]
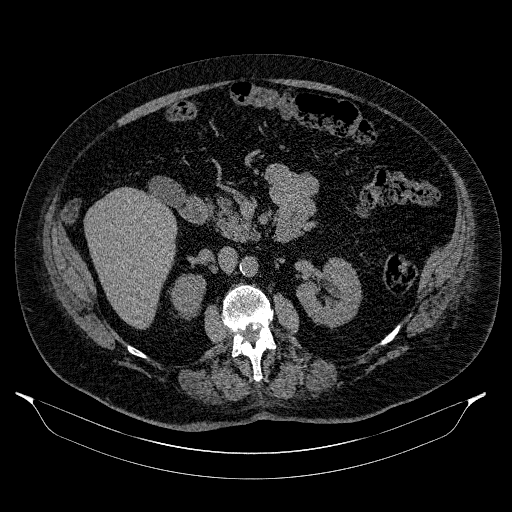
[im 22/415  lung]
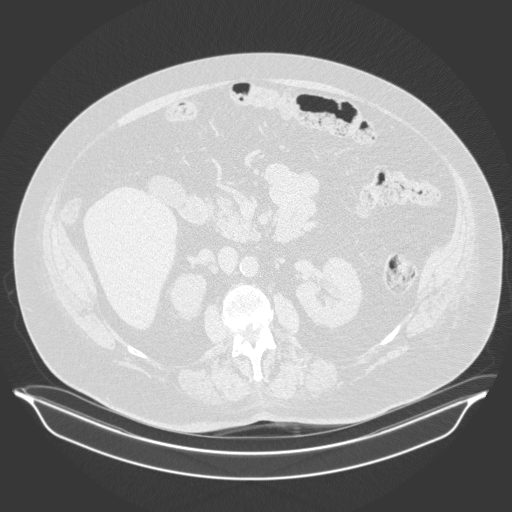
[im 66/415  lung]
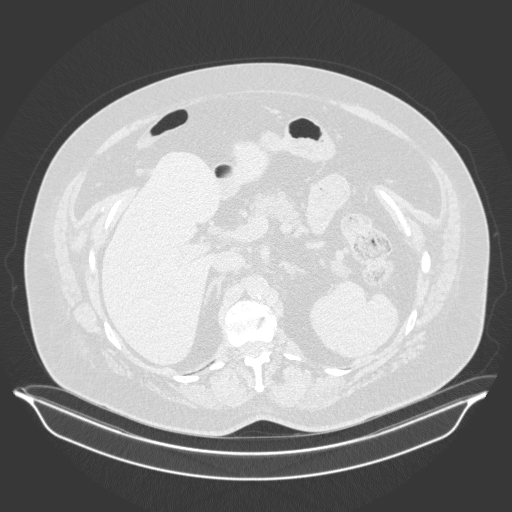
[im 88/415  lung]
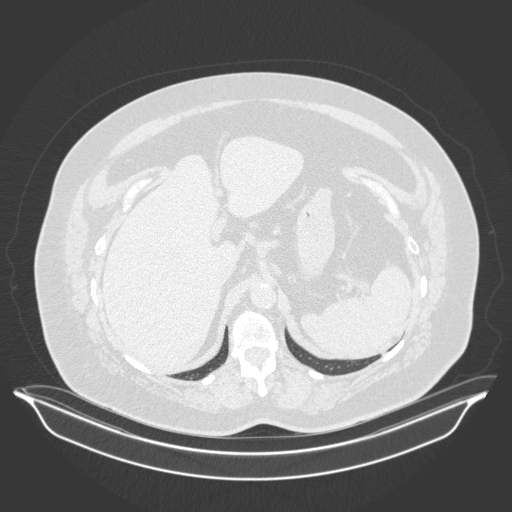
[im 131/415  lung]
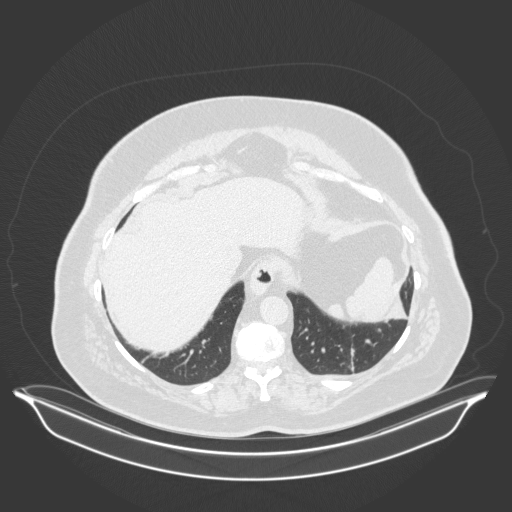
[im 139/415  mediastinal]
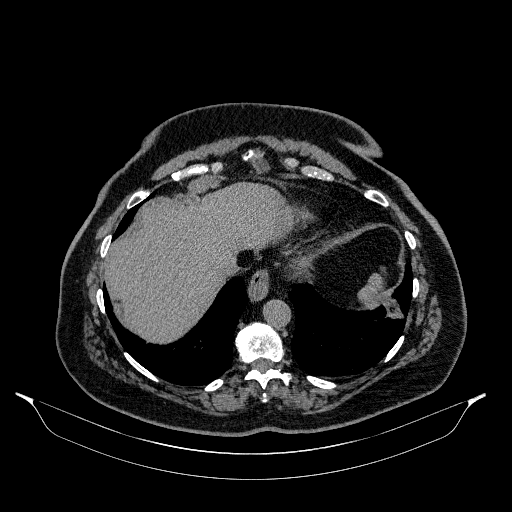
[im 139/415  lung]
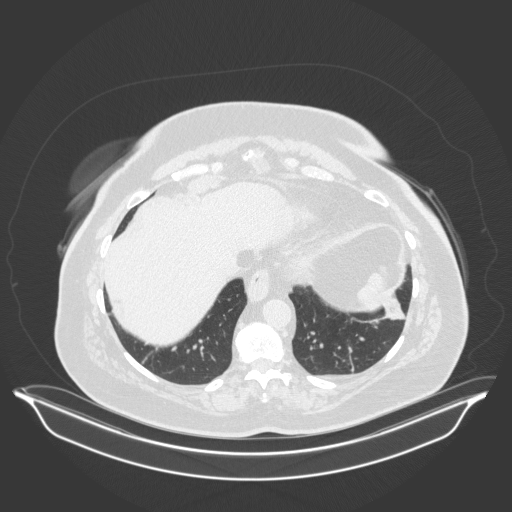
[im 175/415  lung]
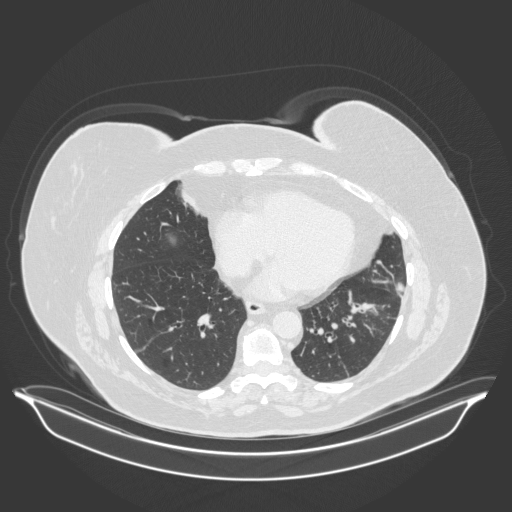
[im 196/415  lung]
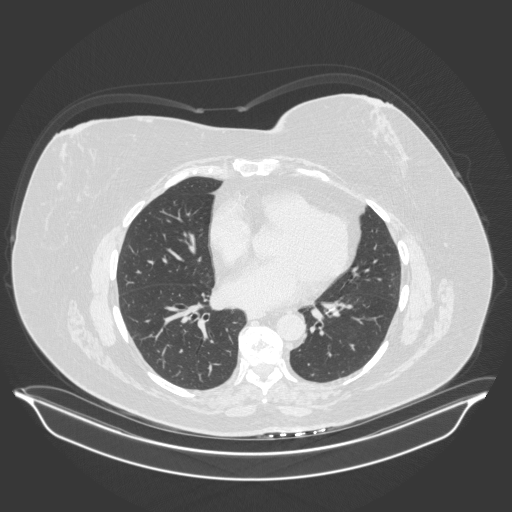
[im 218/415  lung]
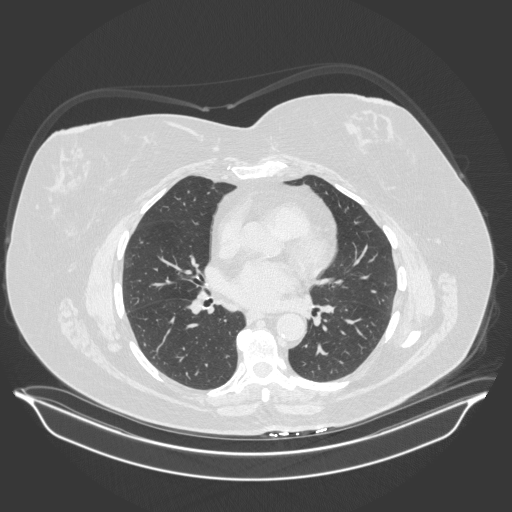
[im 240/415  mediastinal]
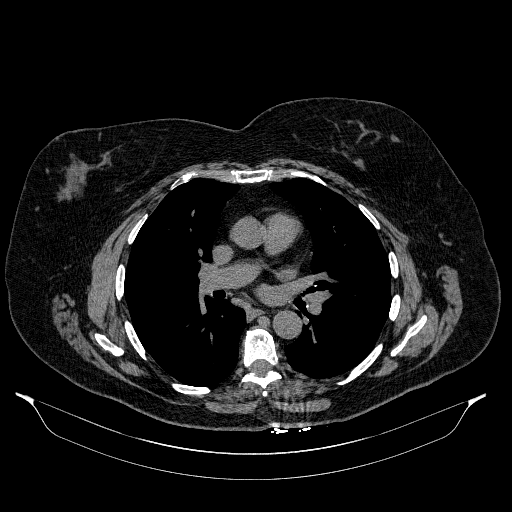
[im 240/415  lung]
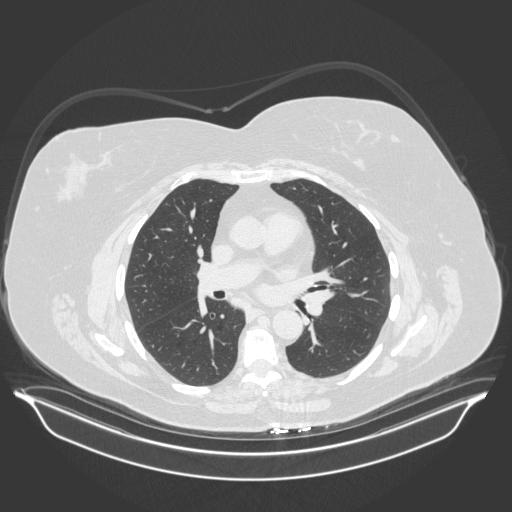
[im 277/415  lung]
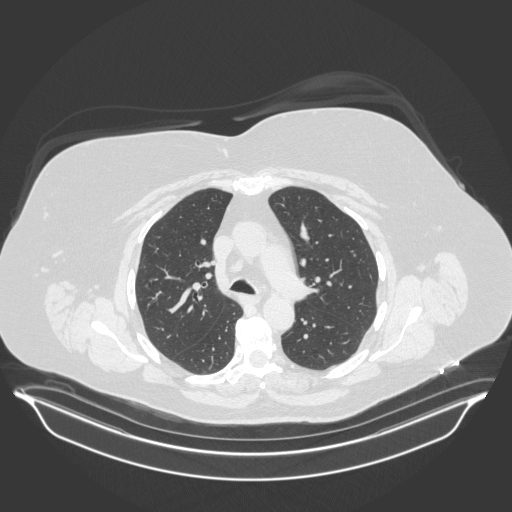
[im 284/415  lung]
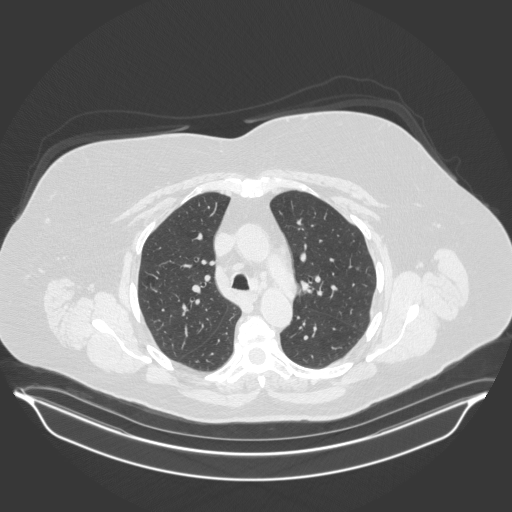
[im 327/415  lung]
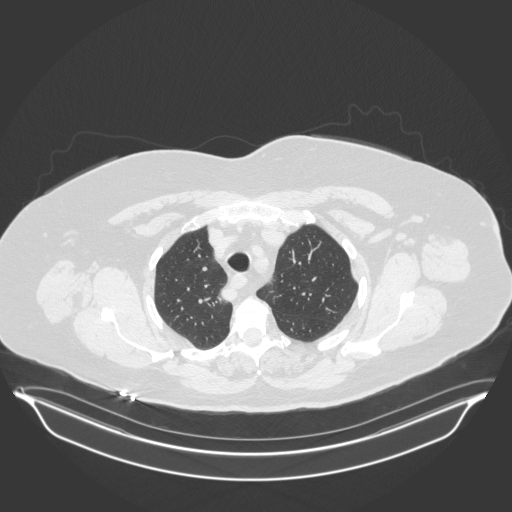
[im 349/415  mediastinal]
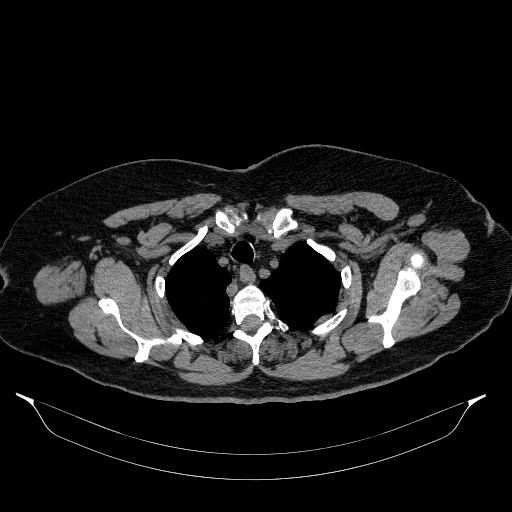
[im 349/415  lung]
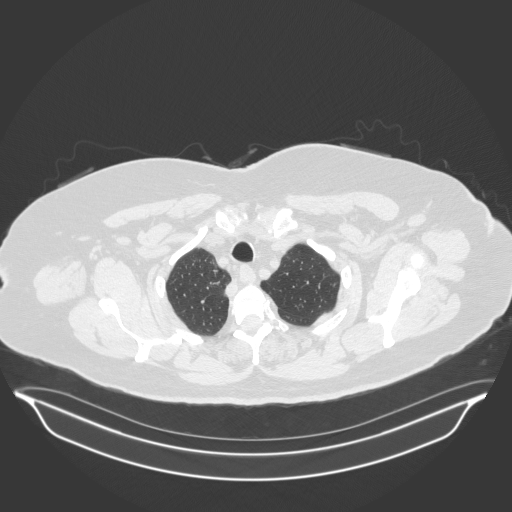
[im 393/415  lung]
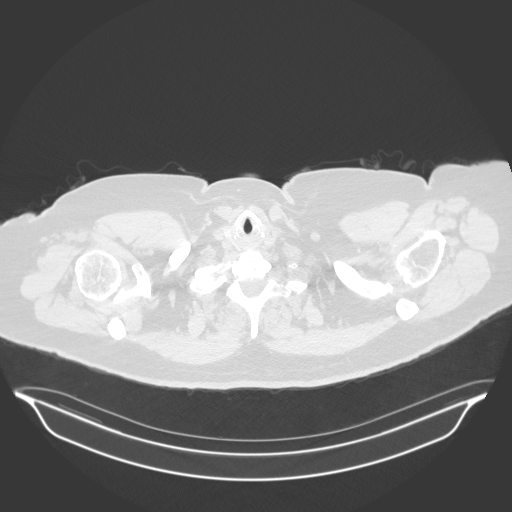

[14 of 32 positions shown; findings below may reference images not displayed]

FINDINGS: Cardiovascular: Mild atherosclerosis of the aorta, great vessels and
coronary arteries. An aberrant retroesophageal right subclavian
artery is again noted. The heart size is normal. There is no
pericardial effusion.

Mediastinum/Nodes: There are no enlarged mediastinal, hilar or
axillary lymph nodes.Small mediastinal lymph nodes are unchanged.
Hilar assessment is limited by the lack of intravenous contrast,
although the hilar contours appear unchanged. Small hiatal hernia.

Lungs/Pleura: No pleural effusion or pneumothorax. Scattered small
pulmonary nodules are again noted, largest measuring 4 mm in the
right upper lobe (image 58/5). No new, enlarging or suspicious
pulmonary nodules. There is central airway thickening in the left
lower lobe with new surrounding parenchymal opacity which may
reflect atelectasis or scarring. No consolidation.

Upper abdomen: The visualized upper abdomen appears stable without
significant findings.

Musculoskeletal/Chest wall: There is no chest wall mass or
suspicious osseous finding. Multilevel thoracic spondylosis. Small
cervical ribs bilaterally.
IMPRESSION: 1. Stable small pulmonary nodules bilaterally consistent with benign
findings. Per consensus guidelines, no additional follow-up
necessary. This recommendation follows the consensus statement:
Guidelines for Management of Small Pulmonary Nodules Detected on CT
Images: From the [HOSPITAL] 1609; Radiology 1609;
[DATE].
2. Left lower lobe bronchial wall thickening with surrounding
streaky parenchymal scarring or atelectasis.
3. Aberrant right subclavian artery. Coronary and Aortic
Atherosclerosis ([id number]-QN3.3).

## 2024-04-06 ENCOUNTER — Encounter: Payer: Self-pay | Admitting: Internal Medicine

## 2024-04-06 NOTE — Progress Notes (Unsigned)
 "   Subjective:    Patient ID: Kayla Maddox, female    DOB: [birth year], 79 y.o.   MRN: [id number]      HPI Jilliam is here for a Physical exam and her chronic medical problems.   Keeps getting dizzy and off balance -- two weeks ago had severe attack of vertigo --ended up going to the hospital for evaluation-given anti-nausea med and meclizine .  Since then has mild dizzy episodes.  Not related to changes in positions.  She denies any true spinning just feeling off balance.  She has taken meclizine  as needed and that has been helpful.   Medications and allergies reviewed with patient and updated if appropriate.  Medications Ordered Prior to Encounter[1]  Review of Systems  Constitutional:  Negative for fever.  HENT:  Positive for hearing loss.   Eyes:  Negative for visual disturbance.  Respiratory:  Positive for cough (chronic- improved) and shortness of breath (chronic - no change). Negative for wheezing.   Cardiovascular:  Negative for chest pain, palpitations and leg swelling.  Gastrointestinal:  Negative for abdominal pain, blood in stool, constipation and diarrhea.       No gerd  Genitourinary:  Negative for dysuria.  Musculoskeletal:  Positive for arthralgias (hand). Negative for back pain.  Skin:  Negative for rash.  Neurological:  Positive for dizziness. Negative for weakness, light-headedness, numbness and headaches.  Psychiatric/Behavioral:  Positive for dysphoric mood. The patient is nervous/anxious.        Objective:   Vitals:   04/09/24 1343  BP: 124/78  Pulse: 80  Temp: 98.6 F (37 C)  SpO2: 95%   Filed Weights   04/09/24 1343  Weight: 208 lb (94.3 kg)   Body mass index is 35.7 kg/m.  BP Readings from Last 3 Encounters:  04/09/24 124/78  03/18/24 130/82  08/29/23 128/72    Wt Readings from Last 3 Encounters:  04/09/24 208 lb (94.3 kg)  03/18/24 211 lb (95.7 kg)  08/29/23 217 lb (98.4 kg)       Physical Exam Constitutional: She appears  well-developed and well-nourished. No distress.  HENT:  Head: Normocephalic and atraumatic.  Right Ear: External ear normal. Normal ear canal and TM Left Ear: External ear normal.  Normal ear canal and TM Mouth/Throat: Oropharynx is clear and moist.  Eyes: Conjunctivae normal.  Neck: Neck supple. No tracheal deviation present. No thyromegaly present.  No carotid bruit  Cardiovascular: Normal rate, regular rhythm and normal heart sounds.   No murmur heard.  No edema. Pulmonary/Chest: Effort normal and breath sounds normal. No respiratory distress. She has no wheezes. She has no rales.  Breast: deferred   Abdominal: Soft. She exhibits no distension. There is no tenderness.  Lymphadenopathy: She has no cervical adenopathy.  Skin: Skin is warm and dry. She is not diaphoretic.  Psychiatric: She has a normal mood and affect. Her behavior is normal.     Lab Results  Component Value Date   WBC 9.3 02/28/2023   HGB 14.9 02/28/2023   HCT 44.2 02/28/2023   PLT 288.0 02/28/2023   GLUCOSE 97 08/29/2023   CHOL 246 (H) 08/29/2023   TRIG 230.0 (H) 08/29/2023   HDL 46.50 08/29/2023   LDLDIRECT 183.0 08/16/2022   LDLCALC 153 (H) 08/29/2023   ALT 17 08/29/2023   AST 19 08/29/2023   NA 140 08/29/2023   K 3.8 08/29/2023   CL 104 08/29/2023   CREATININE 0.91 08/29/2023   BUN 11 08/29/2023   CO2 31 08/29/2023  TSH 2.00 02/28/2023   HGBA1C 6.2 08/29/2023   MICROALBUR 1.6 08/29/2023         Assessment & Plan:   Physical exam: Screening blood work  ordered Exercise  none Weight  obese Substance abuse  none   Reviewed recommended immunizations.   Health Maintenance  Topic Date Due   Medicare Annual Wellness (AWV)  02/23/2024   HEMOGLOBIN A1C  02/28/2024   COVID-19 Vaccine (6 - 2025-26 season) 04/22/2024 (Originally 11/12/2023)   Colonoscopy  04/09/2025 (Originally 07/09/2023)   Diabetic kidney evaluation - eGFR measurement  08/28/2024   Diabetic kidney evaluation - Urine ACR   08/28/2024   FOOT EXAM  08/28/2024   OPHTHALMOLOGY EXAM  01/02/2025   Mammogram  04/18/2025   Bone Density Scan  05/05/2025   DTaP/Tdap/Td (3 - Td or Tdap) 12/07/2031   Pneumococcal Vaccine: 50+ Years  Completed   Influenza Vaccine  Completed   Hepatitis C Screening  Completed   Zoster Vaccines- Shingrix  Completed   Meningococcal B Vaccine  Aged Out          See Problem List for Assessment and Plan of chronic medical problems.        [1]  Current Outpatient Medications on File Prior to Visit  Medication Sig Dispense Refill   albuterol  (VENTOLIN  HFA) 108 (90 Base) MCG/ACT inhaler Inhale 2 puffs into the lungs every 4 (four) hours as needed for wheezing or shortness of breath. 8 g 6   ALPRAZolam  (XANAX ) 0.25 MG tablet Take 1 tablet (0.25 mg total) by mouth 2 (two) times daily as needed for anxiety. 60 tablet 2   Ascorbic Acid (VITAMIN C) 1000 MG tablet Take 1,000 mg by mouth daily.     benzonatate  (TESSALON ) 200 MG capsule TAKE ONE CAPSULE BY MOUTH THREE TIMES DAILY AS NEEDED FOR COUGH 90 capsule 0   Budeson-Glycopyrrol-Formoterol  (BREZTRI  AEROSPHERE) 160-9-4.8 MCG/ACT AERO INHALE 2 PUFFS TWICE DAILY IN THE MORNING & AT BEDTIME 32.1 g 2   Calcium  Carbonate (CALCIUM  500 PO) Take by mouth.     cetirizine  (ZYRTEC ) 10 MG tablet Take 1 tablet (10 mg total) by mouth daily. 90 tablet 11   Coenzyme Q10 (CO Q-10) 200 MG CAPS Take 200 mg by mouth.     cyclobenzaprine  (FLEXERIL ) 5 MG tablet Take 1 tablet (5 mg total) by mouth at bedtime. 30 tablet 0   escitalopram  (LEXAPRO ) 10 MG tablet Take 1 tablet (10 mg total) by mouth daily. 90 tablet 3   ezetimibe  (ZETIA ) 10 MG tablet Take 1 tablet (10 mg total) by mouth daily. 90 tablet 3   fluticasone  (FLONASE ) 50 MCG/ACT nasal spray INHALE 2 SPRAYS IN EACH NOSTRIL DAILY 16 g 3   hydrochlorothiazide  (HYDRODIURIL ) 25 MG tablet Take 1 tablet (25 mg total) by mouth daily. 90 tablet 3   hydrOXYzine  (ATARAX ) 10 MG tablet Take 1 tablet (10 mg total) by  mouth 3 (three) times daily as needed. 30 tablet 0   mesalamine  (APRISO ) 0.375 g 24 hr capsule TAKE 4 CAPSULES EVERY DAY 120 capsule 2   mometasone  (NASONEX ) 50 MCG/ACT nasal spray Place 2 sprays into the nose daily. 1 each 12   Semaglutide ,0.25 or 0.5MG /DOS, (OZEMPIC , 0.25 OR 0.5 MG/DOSE,) 2 MG/3ML SOPN Inject 0.25 mg into the skin once a week. Dx E11.8 9 mL 0   triamcinolone  cream (KENALOG ) 0.1 % Apply 1 Application topically 2 (two) times daily. 75 g 1   Vibegron  (GEMTESA ) 75 MG TABS Take 1 tablet (75 mg total) by mouth  daily. 30 tablet 5   vitamin B-12 (CYANOCOBALAMIN ) 1000 MCG tablet Take 1 tablet (1,000 mcg total) by mouth daily.     No current facility-administered medications on file prior to visit.   "

## 2024-04-06 NOTE — Patient Instructions (Addendum)
 "     Blood work was ordered.       Medications changes include :   None    A referral was ordered and someone will call you to schedule an appointment.     Return in about 6 months (around 10/07/2024) for follow up.    Health Maintenance, Female Adopting a healthy lifestyle and getting preventive care are important in promoting health and wellness. Ask your health care provider about: The right schedule for you to have regular tests and exams. Things you can do on your own to prevent diseases and keep yourself healthy. What should I know about diet, weight, and exercise? Eat a healthy diet  Eat a diet that includes plenty of vegetables, fruits, low-fat dairy products, and lean protein. Do not eat a lot of foods that are high in solid fats, added sugars, or sodium. Maintain a healthy weight Body mass index (BMI) is used to identify weight problems. It estimates body fat based on height and weight. Your health care provider can help determine your BMI and help you achieve or maintain a healthy weight. Get regular exercise Get regular exercise. This is one of the most important things you can do for your health. Most adults should: Exercise for at least 150 minutes each week. The exercise should increase your heart rate and make you sweat (moderate-intensity exercise). Do strengthening exercises at least twice a week. This is in addition to the moderate-intensity exercise. Spend less time sitting. Even light physical activity can be beneficial. Watch cholesterol and blood lipids Have your blood tested for lipids and cholesterol at 77 years of age, then have this test every 5 years. Have your cholesterol levels checked more often if: Your lipid or cholesterol levels are high. You are older than 77 years of age. You are at high risk for heart disease. What should I know about cancer screening? Depending on your health history and family history, you may need to have cancer  screening at various ages. This may include screening for: Breast cancer. Cervical cancer. Colorectal cancer. Skin cancer. Lung cancer. What should I know about heart disease, diabetes, and high blood pressure? Blood pressure and heart disease High blood pressure causes heart disease and increases the risk of stroke. This is more likely to develop in people who have high blood pressure readings or are overweight. Have your blood pressure checked: Every 3-5 years if you are 19-92 years of age. Every year if you are 90 years old or older. Diabetes Have regular diabetes screenings. This checks your fasting blood sugar level. Have the screening done: Once every three years after age 68 if you are at a normal weight and have a low risk for diabetes. More often and at a younger age if you are overweight or have a high risk for diabetes. What should I know about preventing infection? Hepatitis B If you have a higher risk for hepatitis B, you should be screened for this virus. Talk with your health care provider to find out if you are at risk for hepatitis B infection. Hepatitis C Testing is recommended for: Everyone born from [birth year] through 1963. Anyone with known risk factors for hepatitis C. Sexually transmitted infections (STIs) Get screened for STIs, including gonorrhea and chlamydia, if: You are sexually active and are younger than 77 years of age. You are older than 77 years of age and your health care provider tells you that you are at risk for this type of infection. Your sexual  activity has changed since you were last screened, and you are at increased risk for chlamydia or gonorrhea. Ask your health care provider if you are at risk. Ask your health care provider about whether you are at high risk for HIV. Your health care provider may recommend a prescription medicine to help prevent HIV infection. If you choose to take medicine to prevent HIV, you should first get tested for HIV. You  should then be tested every 3 months for as long as you are taking the medicine. Pregnancy If you are about to stop having your period (premenopausal) and you may become pregnant, seek counseling before you get pregnant. Take 400 to 800 micrograms (mcg) of folic acid every day if you become pregnant. Ask for birth control (contraception) if you want to prevent pregnancy. Osteoporosis and menopause Osteoporosis is a disease in which the bones lose minerals and strength with aging. This can result in bone fractures. If you are 32 years old or older, or if you are at risk for osteoporosis and fractures, ask your health care provider if you should: Be screened for bone loss. Take a calcium  or vitamin D  supplement to lower your risk of fractures. Be given hormone replacement therapy (HRT) to treat symptoms of menopause. Follow these instructions at home: Alcohol use Do not drink alcohol if: Your health care provider tells you not to drink. You are pregnant, may be pregnant, or are planning to become pregnant. If you drink alcohol: Limit how much you have to: 0-1 drink a day. Know how much alcohol is in your drink. In the [place] one drink equals one 12 oz bottle of beer (355 mL), one 5 oz glass of wine (148 mL), or one 1 oz glass of hard liquor (44 mL). Lifestyle Do not use any products that contain nicotine or tobacco. These products include cigarettes, chewing tobacco, and vaping devices, such as e-cigarettes. If you need help quitting, ask your health care provider. Do not use street drugs. Do not share needles. Ask your health care provider for help if you need support or information about quitting drugs. General instructions Schedule regular health, dental, and eye exams. Stay current with your vaccines. Tell your health care provider if: You often feel depressed. You have ever been abused or do not feel safe at home. Summary Adopting a healthy lifestyle and getting preventive care are  important in promoting health and wellness. Follow your health care provider's instructions about healthy diet, exercising, and getting tested or screened for diseases. Follow your health care provider's instructions on monitoring your cholesterol and blood pressure. This information is not intended to replace advice given to you by your health care provider. Make sure you discuss any questions you have with your health care provider. Document Revised: 07/19/2020 Document Reviewed: 07/19/2020 Elsevier Patient Education  2024 [employer]. "

## 2024-04-09 ENCOUNTER — Ambulatory Visit: Admitting: Internal Medicine

## 2024-04-09 VITALS — BP 124/78 | HR 80 | Temp 98.6°F | Ht 64.0 in | Wt 208.0 lb

## 2024-04-09 DIAGNOSIS — E785 Hyperlipidemia, unspecified: Secondary | ICD-10-CM | POA: Diagnosis not present

## 2024-04-09 DIAGNOSIS — I152 Hypertension secondary to endocrine disorders: Secondary | ICD-10-CM

## 2024-04-09 DIAGNOSIS — F32A Depression, unspecified: Secondary | ICD-10-CM

## 2024-04-09 DIAGNOSIS — F419 Anxiety disorder, unspecified: Secondary | ICD-10-CM

## 2024-04-09 DIAGNOSIS — E538 Deficiency of other specified B group vitamins: Secondary | ICD-10-CM

## 2024-04-09 DIAGNOSIS — M85852 Other specified disorders of bone density and structure, left thigh: Secondary | ICD-10-CM | POA: Diagnosis not present

## 2024-04-09 DIAGNOSIS — E559 Vitamin D deficiency, unspecified: Secondary | ICD-10-CM | POA: Diagnosis not present

## 2024-04-09 DIAGNOSIS — J4489 Other specified chronic obstructive pulmonary disease: Secondary | ICD-10-CM

## 2024-04-09 DIAGNOSIS — N3281 Overactive bladder: Secondary | ICD-10-CM | POA: Diagnosis not present

## 2024-04-09 DIAGNOSIS — K5010 Crohn's disease of large intestine without complications: Secondary | ICD-10-CM

## 2024-04-09 DIAGNOSIS — Z0000 Encounter for general adult medical examination without abnormal findings: Secondary | ICD-10-CM

## 2024-04-09 DIAGNOSIS — E1169 Type 2 diabetes mellitus with other specified complication: Secondary | ICD-10-CM

## 2024-04-09 DIAGNOSIS — E1159 Type 2 diabetes mellitus with other circulatory complications: Secondary | ICD-10-CM | POA: Diagnosis not present

## 2024-04-09 LAB — COMPREHENSIVE METABOLIC PANEL WITH GFR
ALT: 14 U/L (ref 3–35)
AST: 15 U/L (ref 5–37)
Albumin: 4.1 g/dL (ref 3.5–5.2)
Alkaline Phosphatase: 66 U/L (ref 39–117)
BUN: 10 mg/dL (ref 6–23)
CO2: 34 meq/L — ABNORMAL HIGH (ref 19–32)
Calcium: 9.2 mg/dL (ref 8.4–10.5)
Chloride: 100 meq/L (ref 96–112)
Creatinine, Ser: 0.87 mg/dL (ref 0.40–1.20)
GFR: 64.47 mL/min
Glucose, Bld: 101 mg/dL — ABNORMAL HIGH (ref 70–99)
Potassium: 3.4 meq/L — ABNORMAL LOW (ref 3.5–5.1)
Sodium: 139 meq/L (ref 135–145)
Total Bilirubin: 0.5 mg/dL (ref 0.2–1.2)
Total Protein: 7.1 g/dL (ref 6.0–8.3)

## 2024-04-09 LAB — LIPID PANEL
Cholesterol: 201 mg/dL — ABNORMAL HIGH (ref 28–200)
HDL: 45 mg/dL
LDL Cholesterol: 117 mg/dL — ABNORMAL HIGH (ref 10–99)
NonHDL: 155.81
Total CHOL/HDL Ratio: 4
Triglycerides: 192 mg/dL — ABNORMAL HIGH (ref 10.0–149.0)
VLDL: 38.4 mg/dL (ref 0.0–40.0)

## 2024-04-09 LAB — CBC
HCT: 42.5 % (ref 36.0–46.0)
Hemoglobin: 14.6 g/dL (ref 12.0–15.0)
MCHC: 34.3 g/dL (ref 30.0–36.0)
MCV: 86.4 fl (ref 78.0–100.0)
Platelets: 238 10*3/uL (ref 150.0–400.0)
RBC: 4.92 Mil/uL (ref 3.87–5.11)
RDW: 13.6 % (ref 11.5–15.5)
WBC: 8.9 10*3/uL (ref 4.0–10.5)

## 2024-04-09 LAB — HEMOGLOBIN A1C: Hgb A1c MFr Bld: 6.1 % (ref 4.6–6.5)

## 2024-04-09 MED ORDER — MECLIZINE HCL 25 MG PO TABS: 12.5000 mg | ORAL_TABLET | Freq: Two times a day (BID) | ORAL | 2 refills | Status: AC | PRN

## 2024-04-09 MED ORDER — ESCITALOPRAM OXALATE 10 MG PO TABS: 10.0000 mg | ORAL_TABLET | Freq: Every day | ORAL | 3 refills | Status: AC

## 2024-04-09 NOTE — Assessment & Plan Note (Signed)
 Chronic Cmp, CBC Blood pressure well controlled Continue hydrochlorothiazide  25 mg daily

## 2024-04-09 NOTE — Assessment & Plan Note (Signed)
 Chronic Associated with hyperlipidemia, hypertension, morbid obesity Lab Results  Component Value Date   HGBA1C 6.2 08/29/2023   Sugars controlled Check A1c, urine albumin/creatinine today Stressed regular exercise, diabetic diet

## 2024-04-09 NOTE — Assessment & Plan Note (Signed)
 Chronic Controlled Continue Lexapro  10 mg daily Continue alprazolam  0.25 mg twice daily.

## 2024-04-09 NOTE — Assessment & Plan Note (Signed)
 Chronic Taking vitamin D  but not consistently Check vitamin D  level

## 2024-04-09 NOTE — Assessment & Plan Note (Signed)
 Chronic Following with pulmonary With chronic shortness of breath Breztri  twice daily, albuterol  as needed

## 2024-04-09 NOTE — Assessment & Plan Note (Signed)
 Chronic DEXA up-to-date Mild osteopenia Not exercising Continue calcium  and vitamin d  Check vitamin D  level

## 2024-04-09 NOTE — Assessment & Plan Note (Signed)
Chronic Regular exercise and healthy diet encouraged Check lipid panel Statin intolerant Continue Zetia 10 mg daily

## 2024-04-09 NOTE — Assessment & Plan Note (Signed)
 Chronic Not taking B12 daily Check B12 level

## 2024-04-09 NOTE — Assessment & Plan Note (Signed)
 Chronic Fairly controlled Continue Gemtesa  75 mg daily

## 2024-04-09 NOTE — Assessment & Plan Note (Signed)
Chronic Controlled Management per Dr Hilarie Fredrickson On mesalamine

## 2024-04-09 NOTE — Assessment & Plan Note (Addendum)
 Chronic BMI 35.7 with hypertension, hyperlipidemia and diabetes Not exercising She does not eat much Encouraged diet high in protein, low sugar/carb diet

## 2024-04-10 ENCOUNTER — Ambulatory Visit: Payer: Self-pay | Admitting: Internal Medicine

## 2024-04-10 LAB — VITAMIN D 25 HYDROXY (VIT D DEFICIENCY, FRACTURES): VITD: 25.64 ng/mL — ABNORMAL LOW (ref 30.00–100.00)

## 2024-04-10 LAB — VITAMIN B12: Vitamin B-12: 225 pg/mL (ref 211–911)

## 2024-04-10 LAB — TSH: TSH: 1.37 u[IU]/mL (ref 0.35–5.50)

## 2024-04-25 ENCOUNTER — Ambulatory Visit

## 2024-10-13 ENCOUNTER — Ambulatory Visit: Admitting: Internal Medicine
